# Patient Record
Sex: Female | Born: 1995 | Hispanic: No | Marital: Single | Smoking: Never smoker
Health system: Southern US, Community
[De-identification: ages and names within clinical notes are randomized; demographics above are authoritative.]

## PROBLEM LIST (undated history)

## (undated) VITALS — BP 135/76 | HR 83 | Temp 98.0°F | Resp 16

## (undated) DIAGNOSIS — F419 Anxiety disorder, unspecified: Secondary | ICD-10-CM

## (undated) DIAGNOSIS — E785 Hyperlipidemia, unspecified: Secondary | ICD-10-CM

## (undated) DIAGNOSIS — G43909 Migraine, unspecified, not intractable, without status migrainosus: Secondary | ICD-10-CM

## (undated) DIAGNOSIS — K219 Gastro-esophageal reflux disease without esophagitis: Secondary | ICD-10-CM

## (undated) DIAGNOSIS — E669 Obesity, unspecified: Secondary | ICD-10-CM

## (undated) DIAGNOSIS — K257 Chronic gastric ulcer without hemorrhage or perforation: Secondary | ICD-10-CM

## (undated) DIAGNOSIS — M549 Dorsalgia, unspecified: Secondary | ICD-10-CM

## (undated) DIAGNOSIS — F319 Bipolar disorder, unspecified: Secondary | ICD-10-CM

## (undated) DIAGNOSIS — R569 Unspecified convulsions: Secondary | ICD-10-CM

## (undated) DIAGNOSIS — F32A Depression, unspecified: Secondary | ICD-10-CM

## (undated) DIAGNOSIS — F4310 Post-traumatic stress disorder, unspecified: Secondary | ICD-10-CM

## (undated) DIAGNOSIS — R51 Headache: Secondary | ICD-10-CM

## (undated) DIAGNOSIS — E039 Hypothyroidism, unspecified: Secondary | ICD-10-CM

## (undated) DIAGNOSIS — F609 Personality disorder, unspecified: Secondary | ICD-10-CM

## (undated) DIAGNOSIS — F329 Major depressive disorder, single episode, unspecified: Secondary | ICD-10-CM

## (undated) DIAGNOSIS — I10 Essential (primary) hypertension: Secondary | ICD-10-CM

## (undated) DIAGNOSIS — G8929 Other chronic pain: Secondary | ICD-10-CM

## (undated) DIAGNOSIS — F909 Attention-deficit hyperactivity disorder, unspecified type: Secondary | ICD-10-CM

## (undated) DIAGNOSIS — R519 Headache, unspecified: Secondary | ICD-10-CM

## (undated) DIAGNOSIS — F840 Autistic disorder: Secondary | ICD-10-CM

## (undated) HISTORY — PX: OTHER SURGICAL HISTORY: SHX169

## (undated) HISTORY — DX: Hyperlipidemia, unspecified: E78.5

## (undated) HISTORY — DX: Essential (primary) hypertension: I10

## (undated) HISTORY — DX: Anxiety disorder, unspecified: F41.9

## (undated) HISTORY — DX: Hypothyroidism, unspecified: E03.9

## (undated) HISTORY — DX: Obesity, unspecified: E66.9

---

## 2015-06-28 ENCOUNTER — Ambulatory Visit (INDEPENDENT_AMBULATORY_CARE_PROVIDER_SITE_OTHER): Payer: Medicaid Other | Admitting: Family Medicine

## 2015-06-28 ENCOUNTER — Encounter: Payer: Self-pay | Admitting: Family Medicine

## 2015-06-28 VITALS — BP 112/71 | Ht 64.0 in | Wt 231.0 lb

## 2015-06-28 DIAGNOSIS — M2141 Flat foot [pes planus] (acquired), right foot: Secondary | ICD-10-CM | POA: Diagnosis not present

## 2015-06-28 DIAGNOSIS — M2142 Flat foot [pes planus] (acquired), left foot: Secondary | ICD-10-CM

## 2015-06-28 DIAGNOSIS — G5750 Tarsal tunnel syndrome, unspecified lower limb: Secondary | ICD-10-CM | POA: Diagnosis not present

## 2015-06-28 DIAGNOSIS — M25579 Pain in unspecified ankle and joints of unspecified foot: Secondary | ICD-10-CM

## 2015-07-02 ENCOUNTER — Encounter: Payer: Self-pay | Admitting: Family Medicine

## 2015-07-02 NOTE — Progress Notes (Signed)
  Tamara [name] Rhea [name] - 20 y.o. female MRN [id number]  Date of birth: 26-Jan-1996  CC: B/l ankle discomfort.   SUBJECTIVE:   HPI Tamara [name] a very pleasant 20 yo overweight [employer] freshmen presents for Games developerorthotic construction. She has had foot pain for many years. In the last week she has developed significant bilateral ankle pain. She usually wears sandals and flat. She does have tennis shoes that she wears regularly. She's had no traumas. Over the last week her ankles have been very sore both medially, anteriorly, and laterally. There [name] a moderate amount of swelling most prominently laterally. She walks for exercise. She has not been taking any medications regularly for her pain. She denies any fevers chills or night sweats.  ROS:     As above, no fevers, chills, or night sweats.  No joint swelling or new rashes.    HISTORY: Past Medical, Surgical, Social, and Family History Reviewed & Updated per EMR.  Pertinent Historical Findings include: obesity, PTSD, allergies, never smoker   OBJECTIVE: BP 112/71 mmHg  Ht 5\' 4"  (1.626 m)  Wt 231 lb (104.781 kg)  BMI 39.63 kg/m2  Physical Exam  Calm, NAD Non-labored breathing.   Ankle: b/l Sinus tarsi swelling.  Range of motion [name] full in all directions. Strength [name] 5/5 in all directions. Stable lateral and medial ligaments; squeeze test and kleiger test unremarkable; Talar dome nontender; No specific point tenderness. Generalized tenderness throughout ankle especially over posterior tibialis tendon and sinus tarsi Negative tarsal tunnel tinel's Able to walk 4 steps. Able to do b/l heel raise  Pes planus, calcaneus valgus  Patient was fitted for a : standard, cushioned, semi-rigid orthotic. The orthotic was heated and afterward the patient stood on the orthotic blank positioned on the orthotic stand. The patient was positioned in subtalar neutral position and 10 degrees of ankle dorsiflexion in a weight bearing stance. After completion of  molding, a stable base was applied to the orthotic blank. The blank was ground to a stable position for weight bearing. Size: 10 Base: Blue EVA  Posting: none Additional orthotic padding: medial heel wedge    MEDICATIONS, LABS & OTHER ORDERS: Previous Medications   FLUTICASONE (FLONASE) 50 MCG/ACT NASAL SPRAY    INSTILL 2 SPRAYS IN EACH NOSTRIL ONCE A DAY   MICROGESTIN FE 1.5/30 1.5-30 MG-MCG TABLET    TK 1 T PO QD UTD   Modified Medications   No medications on file   New Prescriptions   No medications on file   Discontinued Medications   No medications on file  No orders of the defined types were placed in this encounter.   ASSESSMENT & PLAN: Sinus Tarsi, pes planus, Post Tib dysfxn: Orthotics as above. She stated that these didn't provide  Improved support. Avoid flip flops and other shoes providing poor support. [name] need to reduce walking for a few days. In general she should probably do as much nonweightbearing exercises possible. She knows she needs to lose weight for her overall health as well as to avoid further bone and joint issues. We will see her back on an as needed. Call with any questions or concerns.  45 minutes of face-to-face time was with this patient more than half of which was face-to-face discussing the construction, sizing, and adjustment of the orthotics.

## 2016-12-18 ENCOUNTER — Emergency Department (HOSPITAL_COMMUNITY)
Admission: EM | Admit: 2016-12-18 | Discharge: 2016-12-19 | Disposition: A | Discharge status: Other Acute Inpatient Hospital | Payer: Medicaid Other | Attending: Psychiatry | Admitting: Psychiatry

## 2016-12-18 ENCOUNTER — Encounter (HOSPITAL_COMMUNITY): Payer: Self-pay

## 2016-12-18 DIAGNOSIS — Z79899 Other long term (current) drug therapy: Secondary | ICD-10-CM | POA: Insufficient documentation

## 2016-12-18 DIAGNOSIS — F909 Attention-deficit hyperactivity disorder, unspecified type: Secondary | ICD-10-CM | POA: Insufficient documentation

## 2016-12-18 DIAGNOSIS — F309 Manic episode, unspecified: Secondary | ICD-10-CM | POA: Insufficient documentation

## 2016-12-18 DIAGNOSIS — R45851 Suicidal ideations: Secondary | ICD-10-CM | POA: Insufficient documentation

## 2016-12-18 DIAGNOSIS — F259 Schizoaffective disorder, unspecified: Secondary | ICD-10-CM | POA: Diagnosis present

## 2016-12-18 HISTORY — DX: Attention-deficit hyperactivity disorder, unspecified type: F90.9

## 2016-12-18 HISTORY — DX: Personality disorder, unspecified: F60.9

## 2016-12-18 HISTORY — DX: Autistic disorder: F84.0

## 2016-12-18 HISTORY — DX: Post-traumatic stress disorder, unspecified: F43.10

## 2016-12-18 LAB — RAPID URINE DRUG SCREEN, HOSP PERFORMED
Amphetamines: NOT DETECTED
BARBITURATES: NOT DETECTED
Benzodiazepines: NOT DETECTED
COCAINE: NOT DETECTED
OPIATES: NOT DETECTED
TETRAHYDROCANNABINOL: NOT DETECTED

## 2016-12-18 LAB — COMPREHENSIVE METABOLIC PANEL
ALK PHOS: 134 U/L — AB (ref 38–126)
ALT: 22 U/L (ref 14–54)
ANION GAP: 11 (ref 5–15)
AST: 23 U/L (ref 15–41)
Albumin: 4.1 g/dL (ref 3.5–5.0)
BILIRUBIN TOTAL: 0.3 mg/dL (ref 0.3–1.2)
BUN: 9 mg/dL (ref 6–20)
CALCIUM: 9.5 mg/dL (ref 8.9–10.3)
CO2: 21 mmol/L — AB (ref 22–32)
CREATININE: 0.66 mg/dL (ref 0.44–1.00)
Chloride: 107 mmol/L (ref 101–111)
Glucose, Bld: 95 mg/dL (ref 65–99)
Potassium: 3.9 mmol/L (ref 3.5–5.1)
Sodium: 139 mmol/L (ref 135–145)
TOTAL PROTEIN: 8 g/dL (ref 6.5–8.1)

## 2016-12-18 LAB — CBC
HEMATOCRIT: 43 % (ref 36.0–46.0)
HEMOGLOBIN: 14.3 g/dL (ref 12.0–15.0)
MCH: 30.2 pg (ref 26.0–34.0)
MCHC: 33.3 g/dL (ref 30.0–36.0)
MCV: 90.7 fL (ref 78.0–100.0)
Platelets: 371 10*3/uL (ref 150–400)
RBC: 4.74 MIL/uL (ref 3.87–5.11)
RDW: 12.5 % (ref 11.5–15.5)
WBC: 15.3 10*3/uL — ABNORMAL HIGH (ref 4.0–10.5)

## 2016-12-18 LAB — SALICYLATE LEVEL

## 2016-12-18 LAB — ETHANOL: Alcohol, Ethyl (B): <10 mg/dL (ref <10)

## 2016-12-18 LAB — ACETAMINOPHEN LEVEL

## 2016-12-18 MED ORDER — ONDANSETRON HCL 4 MG PO TABS: 4.0000 mg | ORAL_TABLET | Freq: Three times a day (TID) | ORAL | Status: DC | PRN

## 2016-12-18 MED ORDER — ALUM & MAG HYDROXIDE-SIMETH 200-200-20 MG/5ML PO SUSP: 30.0000 mL | Freq: Four times a day (QID) | ORAL | Status: DC | PRN

## 2016-12-18 MED ORDER — ACETAMINOPHEN 325 MG PO TABS: 650.0000 mg | ORAL_TABLET | ORAL | Status: DC | PRN

## 2016-12-18 NOTE — ED Triage Notes (Signed)
Patient states she saw her therapist today and was having long periods of sleeping and feeling like she ws intoxicated. Patient states she could not tell what day it was or the time because she has been sleeping for long periods. Patient states she does not remember the pills she was on. Patient states  She has had suicidal thoughts that are on and off like a light switch. Patient states the psychiatrist increased the dosages of medications and states she does not feel like she needs to sleep for long periods of time. Patient states she knows she is taking Risperdal, but can not remember the other meds. Patient states she has been seeing creepy dreams about a weird nurse and seeing shadowy figures. Patient states the weird nurse states that they want to stick her with a weird medication or be killed by "Mr. Knifey."

## 2016-12-18 NOTE — ED Notes (Signed)
Bed: [facility] Expected date:  Expected time:  Means of arrival:  Comments: 
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
## 2016-12-18 NOTE — ED Notes (Signed)
Bed: WA29 Expected date:  Expected time:  Means of arrival:  Comments: Triage 4  

## 2016-12-19 ENCOUNTER — Inpatient Hospital Stay (HOSPITAL_COMMUNITY)
Admission: AD | Admit: 2016-12-19 | Discharge: 2016-12-23 | DRG: 885 | Disposition: A | Discharge status: Home / Self Care | Payer: Medicaid Other | Source: Intra-hospital | Attending: Psychiatry | Admitting: Psychiatry

## 2016-12-19 ENCOUNTER — Encounter (HOSPITAL_COMMUNITY): Payer: Self-pay | Admitting: *Deleted

## 2016-12-19 DIAGNOSIS — K219 Gastro-esophageal reflux disease without esophagitis: Secondary | ICD-10-CM | POA: Diagnosis present

## 2016-12-19 DIAGNOSIS — F419 Anxiety disorder, unspecified: Secondary | ICD-10-CM | POA: Diagnosis not present

## 2016-12-19 DIAGNOSIS — F4310 Post-traumatic stress disorder, unspecified: Secondary | ICD-10-CM | POA: Diagnosis present

## 2016-12-19 DIAGNOSIS — Z888 Allergy status to other drugs, medicaments and biological substances status: Secondary | ICD-10-CM

## 2016-12-19 DIAGNOSIS — R443 Hallucinations, unspecified: Secondary | ICD-10-CM | POA: Diagnosis not present

## 2016-12-19 DIAGNOSIS — F29 Unspecified psychosis not due to a substance or known physiological condition: Secondary | ICD-10-CM | POA: Diagnosis not present

## 2016-12-19 DIAGNOSIS — F259 Schizoaffective disorder, unspecified: Principal | ICD-10-CM | POA: Diagnosis present

## 2016-12-19 DIAGNOSIS — R413 Other amnesia: Secondary | ICD-10-CM | POA: Diagnosis not present

## 2016-12-19 DIAGNOSIS — F39 Unspecified mood [affective] disorder: Secondary | ICD-10-CM | POA: Diagnosis not present

## 2016-12-19 DIAGNOSIS — R45850 Homicidal ideations: Secondary | ICD-10-CM | POA: Diagnosis present

## 2016-12-19 DIAGNOSIS — F840 Autistic disorder: Secondary | ICD-10-CM | POA: Diagnosis present

## 2016-12-19 DIAGNOSIS — Z915 Personal history of self-harm: Secondary | ICD-10-CM

## 2016-12-19 DIAGNOSIS — R45851 Suicidal ideations: Secondary | ICD-10-CM | POA: Diagnosis present

## 2016-12-19 DIAGNOSIS — F909 Attention-deficit hyperactivity disorder, unspecified type: Secondary | ICD-10-CM | POA: Diagnosis present

## 2016-12-19 DIAGNOSIS — Z886 Allergy status to analgesic agent status: Secondary | ICD-10-CM | POA: Diagnosis not present

## 2016-12-19 DIAGNOSIS — F410 Panic disorder [episodic paroxysmal anxiety] without agoraphobia: Secondary | ICD-10-CM | POA: Diagnosis present

## 2016-12-19 DIAGNOSIS — F329 Major depressive disorder, single episode, unspecified: Secondary | ICD-10-CM | POA: Diagnosis present

## 2016-12-19 DIAGNOSIS — F603 Borderline personality disorder: Secondary | ICD-10-CM | POA: Diagnosis present

## 2016-12-19 DIAGNOSIS — F4010 Social phobia, unspecified: Secondary | ICD-10-CM | POA: Diagnosis not present

## 2016-12-19 DIAGNOSIS — R4587 Impulsiveness: Secondary | ICD-10-CM | POA: Diagnosis not present

## 2016-12-19 DIAGNOSIS — R4584 Anhedonia: Secondary | ICD-10-CM | POA: Diagnosis not present

## 2016-12-19 DIAGNOSIS — G4700 Insomnia, unspecified: Secondary | ICD-10-CM | POA: Diagnosis present

## 2016-12-19 MED ORDER — HYDROXYZINE HCL 25 MG PO TABS
25.0000 mg | ORAL_TABLET | Freq: Four times a day (QID) | ORAL | Status: DC | PRN
  Administered 2016-12-21 – 2016-12-23 (×4): 25 mg via ORAL
  Filled 2016-12-19 (×2): qty 1
  Filled 2016-12-19: qty 4
  Filled 2016-12-19 (×2): qty 1

## 2016-12-19 MED ORDER — MELATONIN 3 MG PO TABS: 6.0000 mg | ORAL_TABLET | Freq: Every day | ORAL | Status: DC

## 2016-12-19 MED ORDER — RISPERIDONE 3 MG PO TABS
3.0000 mg | ORAL_TABLET | Freq: Every day | ORAL | Status: DC
  Administered 2016-12-19 – 2016-12-20 (×2): 3 mg via ORAL
  Filled 2016-12-19 (×5): qty 1

## 2016-12-19 MED ORDER — VENLAFAXINE HCL ER 75 MG PO CP24
150.0000 mg | ORAL_CAPSULE | Freq: Every day | ORAL | Status: DC
  Administered 2016-12-19: 150 mg via ORAL
  Filled 2016-12-19: qty 2

## 2016-12-19 MED ORDER — BENZTROPINE MESYLATE 1 MG PO TABS
1.0000 mg | ORAL_TABLET | Freq: Every day | ORAL | Status: DC
  Administered 2016-12-19 – 2016-12-22 (×4): 1 mg via ORAL
  Filled 2016-12-19: qty 1
  Filled 2016-12-19: qty 2
  Filled 2016-12-19 (×6): qty 1

## 2016-12-19 MED ORDER — CARBAMAZEPINE ER 100 MG PO TB12
100.0000 mg | ORAL_TABLET | Freq: Two times a day (BID) | ORAL | Status: DC
  Administered 2016-12-19 – 2016-12-21 (×4): 100 mg via ORAL
  Filled 2016-12-19 (×9): qty 1

## 2016-12-19 MED ORDER — PANTOPRAZOLE SODIUM 40 MG PO TBEC
40.0000 mg | DELAYED_RELEASE_TABLET | Freq: Every day | ORAL | Status: DC
  Administered 2016-12-20 – 2016-12-23 (×4): 40 mg via ORAL
  Filled 2016-12-19: qty 2
  Filled 2016-12-19 (×6): qty 1

## 2016-12-19 MED ORDER — ALUM & MAG HYDROXIDE-SIMETH 200-200-20 MG/5ML PO SUSP
30.0000 mL | ORAL | Status: DC | PRN
  Administered 2016-12-22: 30 mL via ORAL
  Filled 2016-12-19: qty 30

## 2016-12-19 MED ORDER — ONDANSETRON HCL 4 MG PO TABS: 4.0000 mg | ORAL_TABLET | Freq: Three times a day (TID) | ORAL | Status: DC | PRN

## 2016-12-19 MED ORDER — LISDEXAMFETAMINE DIMESYLATE 30 MG PO CAPS
60.0000 mg | ORAL_CAPSULE | Freq: Every day | ORAL | Status: DC
  Administered 2016-12-20: 60 mg via ORAL
  Filled 2016-12-19: qty 2

## 2016-12-19 MED ORDER — AMPHETAMINE-DEXTROAMPHETAMINE 10 MG PO TABS
20.0000 mg | ORAL_TABLET | Freq: Two times a day (BID) | ORAL | Status: DC
  Administered 2016-12-20: 20 mg via ORAL
  Filled 2016-12-19: qty 2

## 2016-12-19 MED ORDER — ACETAMINOPHEN 325 MG PO TABS
650.0000 mg | ORAL_TABLET | Freq: Four times a day (QID) | ORAL | Status: DC | PRN
  Administered 2016-12-19 – 2016-12-23 (×3): 650 mg via ORAL
  Filled 2016-12-19 (×3): qty 2

## 2016-12-19 MED ORDER — TRAZODONE HCL 50 MG PO TABS
50.0000 mg | ORAL_TABLET | Freq: Every evening | ORAL | Status: DC | PRN
  Administered 2016-12-20 – 2016-12-22 (×3): 50 mg via ORAL
  Filled 2016-12-19 (×2): qty 1
  Filled 2016-12-19: qty 2
  Filled 2016-12-19: qty 1

## 2016-12-19 MED ORDER — LISDEXAMFETAMINE DIMESYLATE 30 MG PO CAPS
60.0000 mg | ORAL_CAPSULE | Freq: Every day | ORAL | Status: DC
  Administered 2016-12-19: 60 mg via ORAL
  Filled 2016-12-19: qty 2

## 2016-12-19 MED ORDER — BENZTROPINE MESYLATE 1 MG PO TABS
1.0000 mg | ORAL_TABLET | Freq: Every day | ORAL | Status: DC
  Administered 2016-12-19: 1 mg via ORAL
  Filled 2016-12-19: qty 1

## 2016-12-19 MED ORDER — PANTOPRAZOLE SODIUM 40 MG PO TBEC
40.0000 mg | DELAYED_RELEASE_TABLET | Freq: Every day | ORAL | Status: DC
  Administered 2016-12-19: 40 mg via ORAL
  Filled 2016-12-19: qty 1

## 2016-12-19 MED ORDER — VENLAFAXINE HCL ER 150 MG PO CP24
150.0000 mg | ORAL_CAPSULE | Freq: Every day | ORAL | Status: DC
  Administered 2016-12-20 – 2016-12-23 (×4): 150 mg via ORAL
  Filled 2016-12-19 (×6): qty 1
  Filled 2016-12-19: qty 2
  Filled 2016-12-19: qty 1

## 2016-12-19 MED ORDER — MAGNESIUM HYDROXIDE 400 MG/5ML PO SUSP: 30.0000 mL | Freq: Every day | ORAL | Status: DC | PRN

## 2016-12-19 MED ORDER — RISPERIDONE 1 MG PO TABS
3.0000 mg | ORAL_TABLET | Freq: Every day | ORAL | Status: DC
Start: 2016-12-19 — End: 2016-12-19
  Administered 2016-12-19: 3 mg via ORAL
  Filled 2016-12-19: qty 1

## 2016-12-19 MED ORDER — CARBAMAZEPINE ER 100 MG PO TB12
100.0000 mg | ORAL_TABLET | Freq: Two times a day (BID) | ORAL | Status: DC
  Administered 2016-12-19 (×2): 100 mg via ORAL
  Filled 2016-12-19 (×2): qty 1

## 2016-12-19 MED ORDER — AMPHETAMINE-DEXTROAMPHETAMINE 20 MG PO TABS
20.0000 mg | ORAL_TABLET | Freq: Two times a day (BID) | ORAL | Status: DC
  Administered 2016-12-19: 20 mg via ORAL
  Filled 2016-12-19: qty 1

## 2016-12-19 NOTE — BH Assessment (Signed)
[facility] Assessment Progress Note  12/19/16: Patient has been accepted to [facility] to [id number] at 14:30. Paperwork completed.

## 2016-12-19 NOTE — Progress Notes (Signed)
Patient is a 21 year old female, admitted voluntarily from [facility].  Patient reports she was admitted to [facility] two weeks ago from an intentional OD of melatonin and the medications she was discharged on, "Made me sleep 20 hours every day." Patient visited her OP therapist who gave her the option of either going the ED of she would IVC her. Patient continues to report passive SI, with out a plan. Patient states, "I was having thoughts like, 'If I was dead everyone would be happy.'"She is able to verbally contract for safety on the unit. She also repots, "Shadow figures in the edge of my vision. Some times on the walls and sometimes on the floor." Denies HI and AH.  Patient does have a history of seizures with the last one, "A couple of weeks ago." Patient also has a history of self harm, via burning and cutting. Patient reports her triggers are her therapist and her mentor are not supportive, have too many expectations and she is overwhelmed by her responsibilities.  Paperwork completed. Personal belongings secured in locker. Oriented to unit. Q 15 minute checks initiated

## 2016-12-19 NOTE — ED Notes (Signed)
Patient transferred to [facility] via [place] transport
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
## 2016-12-19 NOTE — ED Notes (Addendum)
Report called to Waynetta Sandy, RN at [facility]. [facility] transport notified for transportation to [facility]
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
## 2016-12-19 NOTE — Tx Team (Signed)
Initial Treatment Plan 12/19/2016 7:03 PM Tamara [name] WUJ:[id number]    PATIENT STRESSORS: Medication change or noncompliance Occupational concerns   PATIENT STRENGTHS: Ability for insight Communication skills General fund of knowledge   PATIENT IDENTIFIED PROBLEMS: Risk for suicide  Depression  Anxiety  "Be more open and honest."  "Have meds that help, not hurt."             DISCHARGE CRITERIA:  Ability to meet basic life and health needs Improved stabilization in mood, thinking, and/or behavior Motivation to continue treatment in a less acute level of care  PRELIMINARY DISCHARGE PLAN: Outpatient therapy Return to previous living arrangement  PATIENT/FAMILY INVOLVEMENT: This treatment plan has been presented to and reviewed with the patient, Tamara [name].  The patient has been given the opportunity to ask questions and make suggestions.  Almira Bar, RN 12/19/2016, 7:03 PM

## 2016-12-20 ENCOUNTER — Encounter (HOSPITAL_COMMUNITY): Payer: Self-pay | Admitting: Student

## 2016-12-20 DIAGNOSIS — F4310 Post-traumatic stress disorder, unspecified: Secondary | ICD-10-CM

## 2016-12-20 DIAGNOSIS — R443 Hallucinations, unspecified: Secondary | ICD-10-CM

## 2016-12-20 DIAGNOSIS — R45851 Suicidal ideations: Secondary | ICD-10-CM

## 2016-12-20 DIAGNOSIS — F259 Schizoaffective disorder, unspecified: Principal | ICD-10-CM

## 2016-12-20 DIAGNOSIS — F909 Attention-deficit hyperactivity disorder, unspecified type: Secondary | ICD-10-CM

## 2016-12-20 DIAGNOSIS — F410 Panic disorder [episodic paroxysmal anxiety] without agoraphobia: Secondary | ICD-10-CM

## 2016-12-20 DIAGNOSIS — R441 Visual hallucinations: Secondary | ICD-10-CM

## 2016-12-20 DIAGNOSIS — F4010 Social phobia, unspecified: Secondary | ICD-10-CM

## 2016-12-20 DIAGNOSIS — R4587 Impulsiveness: Secondary | ICD-10-CM

## 2016-12-20 DIAGNOSIS — F840 Autistic disorder: Secondary | ICD-10-CM

## 2016-12-20 DIAGNOSIS — F603 Borderline personality disorder: Secondary | ICD-10-CM | POA: Diagnosis present

## 2016-12-20 DIAGNOSIS — R450 Nervousness: Secondary | ICD-10-CM

## 2016-12-20 DIAGNOSIS — R440 Auditory hallucinations: Secondary | ICD-10-CM

## 2016-12-20 DIAGNOSIS — Z62810 Personal history of physical and sexual abuse in childhood: Secondary | ICD-10-CM

## 2016-12-20 DIAGNOSIS — F419 Anxiety disorder, unspecified: Secondary | ICD-10-CM

## 2016-12-20 DIAGNOSIS — R4584 Anhedonia: Secondary | ICD-10-CM

## 2016-12-20 DIAGNOSIS — R413 Other amnesia: Secondary | ICD-10-CM

## 2016-12-20 NOTE — Progress Notes (Signed)
Adult Psychoeducational Group Note  Date:  12/20/2016 Time:  2:34 AM  Group Topic/Focus:  Wrap-Up Group:   The focus of this group is to help patients review their daily goal of treatment and discuss progress on daily workbooks.  Participation Level:  Active  Participation Quality:  Appropriate  Affect:  Appropriate  Cognitive:  Appropriate  Insight: Appropriate  Engagement in Group:  Engaged  Modes of Intervention:  Discussion  Additional Comments:  Pt is new admission. Pt stated her goal for today was to talk with her doctor about her medication. Pt stated she did not get to accomplished her goal today. Pt rated her day a 4 out of 10.  Felipa Furnace 12/20/2016, 2:34 AM
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
## 2016-12-20 NOTE — [facility] Suicide Risk Assessment (Signed)
[facility] INPATIENT:  Family/Significant Other Suicide Prevention Education  Suicide Prevention Education:  Patient Refusal for Family/Significant Other Suicide Prevention Education: The patient Tamara [name] has refused to provide written consent for family/significant other to be provided Family/Significant Other Suicide Prevention Education during admission and/or prior to discharge.  Physician notified.  Writer provided suicide prevention education directly to patient; conversation included risk factors, warning signs and resources to contact for help. Mobile crisis services explained and  explanations given as to resources which will be included on patient's discharge paperwork.  Carney Bern 12/20/2016, 4:42 PM

## 2016-12-20 NOTE — Progress Notes (Signed)
Tamara [name] had been up and visible in milieu this evening, did attend and participate in evening activity. She was seen interacting appropriately with peers in milieu. She did endorse some anxiety and spoke about how she has been sleeping a lot lately and not getting up until 11am most of the time. She did endorse some back pain and did request and receive tylenol and was able to receive all bedtime medications without incident. A. Support and encouragement provided. R. Safety maintained, will continue to monitor.

## 2016-12-20 NOTE — [facility] Suicide Risk Assessment (Signed)
[facility] Admission Suicide Risk Assessment   Nursing information obtained from:    Demographic factors:    Current Mental Status:    Loss Factors:    Historical Factors:    Risk Reduction Factors:     Total Time spent with patient: 30 minutes Principal Problem: Schizoaffective disorder (HCC) Diagnosis:   Patient Active Problem List   Diagnosis Date Noted  . Borderline personality disorder (HCC) [F60.3] 12/20/2016  . Suicidal ideations [R45.851] 12/20/2016  . Schizoaffective disorder (HCC) [F25.9] 12/19/2016   Subjective Data:  21 y.o female, single, in college. Took the semester off. Background history of Borderline PD and Schizoaffective disorder. Self presented to the ER unaccompanied. Says she was referred by her therapist . Reports chronic feelings as if things are not real. Reports chronic suicidal thoughts. Has not been sleeping well lately. Sees shadows and figures. Recently discharged from [facility] inpatient care. On multiple medications including stimulants. Routine labs shows insignificant mild leucocytosis. UDS is negative. BAL<5 mg/dl. Reports chaotic relationship with her mom. Reports chaotic relationship with one of her roommates. Says she feels as if she is "drunk" on her medications. She denies any suicidal thoughts. No violent thoughts. No homicidal thoughts. She does not have access to weapons. No substance use. Very focused on taking stimulants. Medication adjustments discussed. Would hold off stimulants and taper daytime Risperidone. Patient consented to treatment.    Continued Clinical Symptoms:  Alcohol Use Disorder Identification Test Final Score (AUDIT): 0 The "Alcohol Use Disorders Identification Test", Guidelines for Use in Primary Care, Second Edition.  World Science writer [facility]). Score between 0-7:  no or low risk or alcohol related problems. Score between 8-15:  moderate risk of alcohol related problems. Score between 16-19:  high risk of alcohol related  problems. Score 20 or above:  warrants further diagnostic evaluation for alcohol dependence and treatment.   CLINICAL FACTORS:   Personality Disorders:   Cluster B   Musculoskeletal: Strength & Muscle Tone: within normal limits Gait & Station: normal Patient leans: N/A  Psychiatric Specialty Exam: Physical Exam  Constitutional: She is oriented to person, place, and time. She appears well-developed and well-nourished.  HENT:  Head: Normocephalic and atraumatic.  Respiratory: Effort normal.  Neurological: She is alert and oriented to person, place, and time.  Psychiatric:  As above     ROS  Blood pressure 116/76, pulse 88, temperature 98.4 F (36.9 C), temperature source Oral, resp. rate 17, height 5' 2.5" (1.588 m), weight 104.3 kg (230 lb), SpO2 100 %.Body mass index is 41.4 kg/m.  General Appearance: Overweight, in hospital clothing. Slightly anxious. Impressionistic pattern of relating.  Eye Contact:  Good  Speech:  Clear and Coherent and Normal Rate  Volume:  Normal  Mood:  Worried she might be in trouble for coming here  Affect:  Appropriate and Restricted  Thought Process:  Linear  Orientation:  Full (Time, Place, and Person)  Thought Content:  Ilusions  Suicidal Thoughts:  No  Homicidal Thoughts:  No  Memory:  Immediate;   Good Recent;   Good Remote;   Good  Judgement:  Fair  Insight:  Fair  Psychomotor Activity:  Normal  Concentration:  Concentration: Good and Attention Span: Good  Recall:  Good  Fund of Knowledge:  Good  Language:  Good  Akathisia:  Negative  Handed:    AIMS (if indicated):     Assets:  Communication Skills Desire for Improvement Housing Physical Health Resilience  ADL's:  Fair  Cognition:  WNL  Sleep:  Number of Hours: 6.75      COGNITIVE FEATURES THAT CONTRIBUTE TO RISK:  None    SUICIDE RISK:   Minimal: No identifiable suicidal ideation.  Patients presenting with no risk factors but with morbid ruminations; may be  classified as minimal risk based on the severity of the depressive symptoms  PLAN OF CARE:  1. Hold stimulants 2. Make Risperidone at bedtime only 3. Monitor mood, behavior and interaction with peers 4. Team would follow up tomorrow.   I certify that inpatient services furnished can reasonably be expected to improve the patient's condition.   Georgiann Cocker, MD 12/20/2016, 5:01 PM

## 2016-12-20 NOTE — [facility] Group Notes (Signed)
[facility] Group Notes:  (Nursing) Date:  12/20/2016  Time:  9:42 AM  Type of Therapy:  Nurse Education  Participation Level:  Active  Participation Quality:  Appropriate  Affect:  Appropriate  Cognitive:  Alert, Appropriate and Oriented  Insight:  Appropriate and Good  Engagement in Group:  Engaged  Modes of Intervention:  Discussion, Education and Rapport Building  Summary of Progress/Problems:  Shela Nevin 12/20/2016, 9:42 AM
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
## 2016-12-20 NOTE — Progress Notes (Signed)
D. Pt has been calm and cooperative throughout shift with no behavioral issues noted.Pt somewhat talkative and childlike in behavior. Pt currently denies SI/HI and AVH and agrees to contact staff before acting on any harmful thoughts.  A. Labs and vitals monitored. Pt compliant with meds Pt supported emotionally and encouraged to express concerns and ask questions.   R. Pt remains safe with 15 minute checks. Will continue POC.

## 2016-12-20 NOTE — [facility] Group Notes (Signed)
[facility] LCSW Group Therapy Note  Date/Time:    12/20/2016   10:00 - 11:00 AM  Type of Therapy and Topic:  Group Therapy:  Healthy Self Image and Positive Change  Participation Level:  Active   Description of Group:  In this group, patients compared and contrasted their current "I am...." statements to the visions they identified as desirable for their lives.  Patients discussed their tendency toward cognitive distortions, and how they can go about making positive changes in their cognitions that will positively impact their behaviors.  Many expressions of similarities and mutual support were provided among group members.  Facilitator played a motivational 3-minute speech and a discussion was held regarding reactions.  Patients were left with the task of thinking about what "I am...." statements they can start using in their lives immediately.  Therapeutic Goals: 1. Patient will state their current self-perception as expressed in an "I Am" statement 2. Patient will contrast this with their desired vision for their lives 3. Patient will discuss cognitive distortions and how these affect their ongoing "I Am" thoughts 4. Patient will verbalize statements that challenge their cognitive distortions  Summary of Patient Progress:  The patient expressed initially "I am psychopathic", then by the end of group was able to state "I am friendly" and explain why she was able to make this change.  She was intrusive and monopolizing throughout group, and had to be redirected from side conversations.   Therapeutic Modalities Cognitive Behavioral Therapy Motivational Interviewing  Ambrose Mantle, LCSW 12/20/2016 8:25 AM

## 2016-12-21 DIAGNOSIS — F29 Unspecified psychosis not due to a substance or known physiological condition: Secondary | ICD-10-CM

## 2016-12-21 DIAGNOSIS — F39 Unspecified mood [affective] disorder: Secondary | ICD-10-CM

## 2016-12-21 MED ORDER — RISPERIDONE 1 MG PO TABS
1.0000 mg | ORAL_TABLET | Freq: Every day | ORAL | Status: DC
  Administered 2016-12-21 – 2016-12-22 (×2): 1 mg via ORAL
  Filled 2016-12-21 (×2): qty 1
  Filled 2016-12-21: qty 2
  Filled 2016-12-21 (×2): qty 1

## 2016-12-21 NOTE — Plan of Care (Signed)
Problem: Coping: Goal: Ability to verbalize frustrations and anger appropriately will improve Outcome: Progressing Pt has been appropriate on the unit this evening , pt expressed the issues with her family and being able to control her money.   Problem: Safety: Goal: Periods of time without injury will increase Outcome: Progressing Pt safe on the unit  At this time

## 2016-12-21 NOTE — Tx Team (Signed)
Interdisciplinary Treatment and Diagnostic Plan Update  12/21/2016 Time of Session: 9:30am Tamara [name] MRN: [id number]  Principal Diagnosis: Schizoaffective disorder [facility])  Secondary Diagnoses: Principal Problem:   Schizoaffective disorder (HCC) Active Problems:   Borderline personality disorder (HCC)   Suicidal ideations   Current Medications:  Current Facility-Administered Medications  Medication Dose Route Frequency Provider Last Rate Last Dose  . acetaminophen (TYLENOL) tablet 650 mg  650 mg Oral Q6H PRN Laveda Abbe, NP   650 mg at 12/19/16 2233  . alum & mag hydroxide-simeth (MAALOX/MYLANTA) 200-200-20 MG/5ML suspension 30 mL  30 mL Oral Q4H PRN Laveda Abbe, NP      . benztropine (COGENTIN) tablet 1 mg  1 mg Oral QHS Laveda Abbe, NP   1 mg at 12/20/16 2128  . carbamazepine (TEGRETOL XR) 12 hr tablet 100 mg  100 mg Oral BID Laveda Abbe, NP   100 mg at 12/21/16 1308  . hydrOXYzine (ATARAX/VISTARIL) tablet 25 mg  25 mg Oral Q6H PRN Laveda Abbe, NP      . magnesium hydroxide (MILK OF MAGNESIA) suspension 30 mL  30 mL Oral Daily PRN Laveda Abbe, NP      . ondansetron [facility]) tablet 4 mg  4 mg Oral Q8H PRN Laveda Abbe, NP      . pantoprazole (PROTONIX) EC tablet 40 mg  40 mg Oral Daily Laveda Abbe, NP   40 mg at 12/21/16 0816  . risperiDONE (RISPERDAL) tablet 3 mg  3 mg Oral QHS Laveda Abbe, NP   3 mg at 12/20/16 2128  . traZODone (DESYREL) tablet 50 mg  50 mg Oral QHS PRN Laveda Abbe, NP   50 mg at 12/20/16 2128  . venlafaxine XR (EFFEXOR-XR) 24 hr capsule 150 mg  150 mg Oral Q breakfast Laveda Abbe, NP   150 mg at 12/21/16 6578    PTA Medications: Prescriptions Prior to Admission  Medication Sig Dispense Refill Last Dose  . amphetamine-dextroamphetamine (ADDERALL) 20 MG tablet Take 20 mg by mouth. TAKE 10 TO 20 MG DAILY IN THE AFTERNOON AND 20 MG IN THE EVENING  AS NEEDED FOR ADHD     . benztropine (COGENTIN) 1 MG tablet Take 1 mg by mouth at bedtime.     . Carbamazepine (EQUETRO) 100 MG CP12 12 hr capsule Take 100 mg by mouth 2 (two) times daily.     Marland Kitchen desvenlafaxine (PRISTIQ) 100 MG 24 hr tablet Take 100 mg by mouth daily.     Marland Kitchen desvenlafaxine (PRISTIQ) 50 MG 24 hr tablet Take 50 mg by mouth daily.     . hydrOXYzine (ATARAX/VISTARIL) 25 MG tablet Take 25 mg by mouth every 6 (six) hours as needed for anxiety.     Marland Kitchen lisdexamfetamine (VYVANSE) 60 MG capsule Take 60 mg by mouth daily.     . Melatonin 3 MG TABS Take 6 mg by mouth at bedtime.   12/17/2016 at Unknown time  . pantoprazole (PROTONIX) 40 MG tablet Take 40 mg by mouth daily.     . risperiDONE (RISPERDAL) 3 MG tablet Take 3 mg by mouth at bedtime.       Treatment Modalities: Medication Management, Group therapy, Case management,  1 to 1 session with clinician, Psychoeducation, Recreational therapy.  Patient Stressors: Medication change or noncompliance Occupational concerns  Patient Strengths: Ability for insight Wellsite geologist fund of knowledge  Physician Treatment Plan for Primary Diagnosis: Schizoaffective disorder (HCC) Long Term Goal(s): Improvement in  symptoms so as ready for discharge  Short Term Goals: Ability to maintain clinical measurements within normal limits will improve Compliance with prescribed medications will improve Ability to verbalize feelings will improve Ability to disclose and discuss suicidal ideas Ability to demonstrate self-control will improve  Medication Management: Evaluate patient's response, side effects, and tolerance of medication regimen.  Therapeutic Interventions: 1 to 1 sessions, Unit Group sessions and Medication administration.  Evaluation of Outcomes: Progressing  Physician Treatment Plan for Secondary Diagnosis: Principal Problem:   Schizoaffective disorder (HCC) Active Problems:   Borderline personality disorder (HCC)    Suicidal ideations   Long Term Goal(s): Improvement in symptoms so as ready for discharge  Short Term Goals: Ability to maintain clinical measurements within normal limits will improve Compliance with prescribed medications will improve Ability to verbalize feelings will improve Ability to disclose and discuss suicidal ideas Ability to demonstrate self-control will improve  Medication Management: Evaluate patient's response, side effects, and tolerance of medication regimen.  Therapeutic Interventions: 1 to 1 sessions, Unit Group sessions and Medication administration.  Evaluation of Outcomes: Progressing   RN Treatment Plan for Primary Diagnosis: Schizoaffective disorder (HCC) Long Term Goal(s): Knowledge of disease and therapeutic regimen to maintain health will improve  Short Term Goals: Ability to disclose and discuss suicidal ideas, Ability to identify and develop effective coping behaviors will improve and Compliance with prescribed medications will improve  Medication Management: RN will administer medications as ordered by provider, will assess and evaluate patient's response and provide education to patient for prescribed medication. RN will report any adverse and/or side effects to prescribing provider.  Therapeutic Interventions: 1 on 1 counseling sessions, Psychoeducation, Medication administration, Evaluate responses to treatment, Monitor vital signs and CBGs as ordered, Perform/monitor CIWA, COWS, AIMS and Fall Risk screenings as ordered, Perform wound care treatments as ordered.  Evaluation of Outcomes: Progressing   LCSW Treatment Plan for Primary Diagnosis: Schizoaffective disorder (HCC) Long Term Goal(s): Safe transition to appropriate next level of care at discharge, Engage patient in therapeutic group addressing interpersonal concerns.  Short Term Goals: Engage patient in aftercare planning with referrals and resources and Increase skills for wellness and  recovery  Therapeutic Interventions: Assess for all discharge needs, 1 to 1 time with Social worker, Explore available resources and support systems, Assess for adequacy in community support network, Educate family and significant other(s) on suicide prevention, Complete Psychosocial Assessment, Interpersonal group therapy.  Evaluation of Outcomes: Progressing   Progress in Treatment: Attending groups: Yes Participating in groups: Yes Taking medication as prescribed: Yes, MD continues to assess for medication changes as needed Toleration medication: Yes, no side effects reported at this time Family/Significant other contact made: No, Pt declines Patient understands diagnosis: Continuing to assess Discussing patient identified problems/goals with staff: Yes Medical problems stabilized or resolved: Yes Denies suicidal/homicidal ideation: Yes Issues/concerns per patient self-inventory: None Other: N/A  New problem(s) identified: None identified at this time.   New Short Term/Long Term Goal(s): None identified at this time.   Discharge Plan or Barriers: Pt will return home and follow-up with outpatient services at [facility]  Reason for Continuation of Hospitalization: Anxiety Depression Medication stabilization  Estimated Length of Stay: 1-3 days; est DC date 10/17  Attendees: Patient:  12/21/2016  10:43 AM  Physician: Dr. Jama Flavors, MD 12/21/2016  10:43 AM  Nursing: Rodell Perna, RN 12/21/2016  10:43 AM  RN Care Manager: Onnie Boer, RN 12/21/2016  10:43 AM  Social Worker: Vernie Shanks, LCSW 12/21/2016  10:43 AM  Recreational Therapist:  12/21/2016  10:43 AM  Other: Armandina Stammer, NP; Reola Calkins, NP; Hillery Jacks, NP 12/21/2016  10:43 AM  Other:  12/21/2016  10:43 AM  Other: 12/21/2016  10:43 AM    Scribe for Treatment Team: Verdene Lennert, LCSW 12/21/2016 10:43 AM

## 2016-12-21 NOTE — Progress Notes (Signed)
Recreation Therapy Notes  Date:  12/21/16  Time: 0930 Location: 300 Hall Dayroom  Group Topic: Stress Management  Goal Area(s) Addresses:  Patient will verbalize importance of using healthy stress management.  Patient will identify positive emotions associated with healthy stress management.   Behavioral Response: Engaged  Intervention: Stress Management  Activity :  Progressive Muscle Relaxation.  LRT introduced the stress management technique of progressive muscle relaxation.  LRT read a script to allow patients to focus on each muscle group individually to tense and then relax the muscles.  Patients were to follow along as the script was read to engage in the activity.  Education:  Stress Management, Discharge Planning.   Education Outcome: Acknowledges edcuation/In group clarification offered/Needs additional education  Clinical Observations/Feedback: Pt attended group.   Avi Kerschner, LRT/CTRS         Emilyrose Darrah A 12/21/2016 11:38 AM 

## 2016-12-21 NOTE — Progress Notes (Signed)
D: Pt observed on the hallway writing. During assessment, Pt expressed irritability and anger; "That African doctor took away my ADHD medication and in know what will happen" Pt endorsed moderate chronic back pain. Pt denied SI, HI, pain or AVH; "I long as I get my ADHD medication, I will be fine." Pt walked away from the nurse during assessment; "if you can't do anything about my ADHD medication then I don't need to be talking to you-you just don't get it."  A: Medications offered as prescribed. Pt given the opportunity to ask questions and state concerns. Support, encouragement, and safe environment provided. R: All patient's questions and concerns addressed. 15-minute safety checks continue. Safety checks continue. Pt did not attend wrap-up group.

## 2016-12-21 NOTE — Progress Notes (Addendum)
[facility] MD Progress Note  12/21/2016 3:57 PM Tamara [name]  MRN:  [id number] Subjective:  Reports feeling " tired", but reports she is feeling better than when she was admitted . At this time she denies suicidal ideations. Objective : I have discussed case with treatment team and have met with patient. 21 year old female, presented to ED voluntarily , at the encouragement of her outpatient therapist  reporting suicidal ideations, sleeping excessively, and feeling overmedicated/intoxicated on medication regimen . She had a recent psychiatric admission to [facility] . Chart notes indicate prior diagnosis of Schizoaffective Disorder, Borderline Personality Disorder. Patient also reports she has been diagnosed with " Autism " in the past . As per staff, has presented depressed, anxious,intermittently tearful,  needy at times, needing redirections from staff. Prior to admission was on Pristiq, Risperidone, Carbamazepine , Vyvanse .  Of note, states that these medications were new to her, started during recent admission. At this time denies suicidal or self injurious plans or intentions, reports depression, and reports feeling excessively sedated with current medication regimen.  Prior to admission ,was experiencing vague hallucinations which she describes as " shadows", at this time not internally preoccupied, does not endorse current psychotic symptoms.    Principal Problem: Schizoaffective disorder ([place]) Diagnosis:   Patient Active Problem List   Diagnosis Date Noted  . Borderline personality disorder ([place]) [F60.3] 12/20/2016  . Suicidal ideations [R45.851] 12/20/2016  . Schizoaffective disorder ([place]) [F25.9] 12/19/2016   Total Time spent with patient: 20 minutes   Past Medical History:  Past Medical History:  Diagnosis Date  . ADHD   . Autism   . Personality disorder ([place])   . PTSD (post-traumatic stress disorder)    History reviewed. No pertinent surgical history. Family  History:  Family History  Problem Relation Age of Onset  . Family history unknown: Yes   Social History:  History  Alcohol Use No     History  Drug Use No    Social History   Social History  . Marital status: Single    Spouse name: N/A  . Number of children: N/A  . Years of education: N/A   Social History Main Topics  . Smoking status: Never Smoker  . Smokeless tobacco: Never Used  . Alcohol use No  . Drug use: No  . Sexual activity: Not Asked   Other Topics Concern  . None   Social History Narrative  . None   Additional Social History:   Sleep: Good- patient states she is feeling excessively sleepy at times   Appetite:  Good  Current Medications: Current Facility-Administered Medications  Medication Dose Route Frequency Provider Last Rate Last Dose  . acetaminophen (TYLENOL) tablet 650 mg  650 mg Oral Q6H PRN Ethelene Hal, NP   650 mg at 12/19/16 2233  . alum & mag hydroxide-simeth (MAALOX/MYLANTA) 200-200-20 MG/5ML suspension 30 mL  30 mL Oral Q4H PRN Ethelene Hal, NP      . benztropine (COGENTIN) tablet 1 mg  1 mg Oral QHS Ethelene Hal, NP   1 mg at 12/20/16 2128  . carbamazepine (TEGRETOL XR) 12 hr tablet 100 mg  100 mg Oral BID Ethelene Hal, NP   100 mg at 12/21/16 8101  . hydrOXYzine (ATARAX/VISTARIL) tablet 25 mg  25 mg Oral Q6H PRN Ethelene Hal, NP      . magnesium hydroxide (MILK OF MAGNESIA) suspension 30 mL  30 mL Oral Daily PRN Ethelene Hal, NP      .  ondansetron (ZOFRAN) tablet 4 mg  4 mg Oral Q8H PRN Ethelene Hal, NP      . pantoprazole (PROTONIX) EC tablet 40 mg  40 mg Oral Daily Ethelene Hal, NP   40 mg at 12/21/16 0816  . risperiDONE (RISPERDAL) tablet 3 mg  3 mg Oral QHS Ethelene Hal, NP   3 mg at 12/20/16 2128  . traZODone (DESYREL) tablet 50 mg  50 mg Oral QHS PRN Ethelene Hal, NP   50 mg at 12/20/16 2128  . venlafaxine XR (EFFEXOR-XR) 24 hr capsule 150 mg   150 mg Oral Q breakfast Ethelene Hal, NP   150 mg at 12/21/16 7482    Lab Results: No results found for this or any previous visit (from the past 48 hour(s)).  Blood Alcohol level:  Lab Results  Component Value Date   ETH <10 70/78/6754    Metabolic Disorder Labs: No results found for: HGBA1C, MPG No results found for: PROLACTIN No results found for: CHOL, TRIG, HDL, CHOLHDL, VLDL, LDLCALC  Physical Findings: AIMS: Facial and Oral Movements Muscles of Facial Expression: None, normal Lips and Perioral Area: None, normal Jaw: None, normal Tongue: None, normal,Extremity Movements Upper (arms, wrists, hands, fingers): None, normal Lower (legs, knees, ankles, toes): None, normal, Trunk Movements Neck, shoulders, hips: None, normal, Overall Severity Severity of abnormal movements (highest score from questions above): None, normal Incapacitation due to abnormal movements: None, normal Patient's awareness of abnormal movements (rate only patient's report): No Awareness, Dental Status Current problems with teeth and/or dentures?: No Does patient usually wear dentures?: No  CIWA:    COWS:     Musculoskeletal: Strength & Muscle Tone: within normal limits Gait & Station: normal Patient leans: N/A  Psychiatric Specialty Exam: Physical Exam  ROS no chest pain, no dyspnea.   Blood pressure 108/70, pulse (!) 102, temperature 98 F (36.7 C), temperature source Oral, resp. rate 20, height 5' 2.5" (1.588 m), weight 104.3 kg (230 lb), SpO2 100 %.Body mass index is 41.4 kg/m.  General Appearance: Fairly Groomed  Eye Contact:  Good  Speech:  Slow  Volume:  Decreased  Mood:  Anxious and Depressed  Affect:  Constricted  Thought Process:  Linear and Descriptions of Associations: Intact  Orientation:  Other:  alert and attentive   Thought Content:  does not endorse hallucinations at this time, and does not appear internally preoccupied   Suicidal Thoughts:  No denies active  suicidal ideations, denies any homicidal ideations, contracts for safety on unit at present  Homicidal Thoughts:  No  Memory:  recent and remote fair  Judgement:  Fair  Insight:  Fair  Psychomotor Activity:  Decreased  Concentration:  Concentration: Good and Attention Span: Good  Recall:  Good  Fund of Knowledge:  Good  Language:  Good  Akathisia:  Negative  Handed:  Right  AIMS (if indicated):     Assets:  Desire for Improvement Resilience  ADL's:  Fair   Cognition:  WNL  Sleep:  Number of Hours: 6.5   Assessment - 21 year old female, presented due to depression, SI, and medication side effects ( excessive sedation) . Reports recent psychiatric admission to another hospital for depression. On unit presenting anxious, intermittently tearful, needing frequent reassurance from staff. Currently denies active SI.   Treatment Plan Summary: Inpatient admission - encourage group and milieu participation to work on coping skills  Treatment team working on disposition planning options  Continue Effexor XR 150 mgrs QDAY for  depression, anxiety  Decrease Risperidone to 1 mgrs QHS for mood disorder- psychosis. Rationale to taper medication is that patient is reporting excessive sedation. At this time discontinue Tegretol - rationale is that patient states she is tolerating this medication poorly and due to side effect profile/ concerns of potential teratogenic effects based on patient's gender/age.  Daily contact with patient to assess and evaluate symptoms and progress in treatment, Medication management, Plan inpatient treatment  and medications as below   Jenne Campus, MD 12/21/2016, 3:57 PM

## 2016-12-21 NOTE — Progress Notes (Signed)
D: Pt presents with a sad affect and depressed mood. Pt rates depression 5/10. Anxiety 7/10. On approach, Pt tearful, constantly worrying and expressing neg thoughts. Pt expressed to Clinical research associate that she was talking to someone in the cafeteria and they told her to stop holding back and get out everything that's bothering her. Pt began crying uncontrollably. Pt verbalized that her mentor is not nice to her and often puts her down. Pt then stated that she's socially awkward and that the other pts on the unit told her that she's making them nervous. Pt verbalized that she will see people talking and then walk up to them and start talking which makes people uncomfortable. Pt noted to have off and on crying spells throughout the morning. Pt appears attention seeking and needy. Pt then verbalized to writer that she felt dizzy and weird. Pt b/p checked and stable. Pt given Gatorade. Pt stated that she told the doctor that Effexor and Tegretol are not good medicines for her and that it makes her feel weird. Pt then stated that she needs her ADHD medicine to help her focus. Pt requires frequent redirecting for disrupting the milieu and intrusive behaviors.  A: Medications reviewed with pt. Medications administered as ordered per MD. Verbal support provided. Pt encouraged to attend groups. Redirection provided as needed. 15 minute checks performed for safety. Feliz Beam, NP., made aware of pt complaints. R: Pt stated goal "talk to psychiatrist".

## 2016-12-21 NOTE — Progress Notes (Signed)
D: Pt denies SI/HI/AH. Endorsed VH- shadows Pt is pleasant and cooperative. Pt appears very depressed, pt very circumstantial and blaming others. Pt appears to want to get herself together, but appears to have an issue with coping/ management skills.   A: Pt was offered support and encouragement. Pt was given scheduled medications. Pt was encourage to attend groups. Q 15 minute checks were done for safety.   R:Pt  interacts well with peers and staff. Pt is taking medication. Pt has no complaints.Pt receptive to treatment and safety maintained on unit.

## 2016-12-21 NOTE — Progress Notes (Signed)
Attempted to collect UA. Pt stated that she's unable to urinate at this time.

## 2016-12-21 NOTE — Social Work (Signed)
Referred to [facility], is [place] resident.  Riyan Gavina, LCSW Lead Clinical Social Worker Phone:  [phone]  

## 2016-12-21 NOTE — [facility] Group Notes (Signed)
LCSW Group Therapy Note   12/21/2016 1:15pm   Type of Therapy and Topic:  Group Therapy:  Overcoming Obstacles   Participation Level:  Did Not Attend   Description of Group:    In this group patients will be encouraged to explore what they see as obstacles to their own wellness and recovery. They will be guided to discuss their thoughts, feelings, and behaviors related to these obstacles. The group will process together ways to cope with barriers, with attention given to specific choices patients can make. Each patient will be challenged to identify changes they are motivated to make in order to overcome their obstacles. This group will be process-oriented, with patients participating in exploration of their own experiences as well as giving and receiving support and challenge from other group members.   Therapeutic Goals: 1. Patient will identify personal and current obstacles as they relate to admission. 2. Patient will identify barriers that currently interfere with their wellness or overcoming obstacles.  3. Patient will identify feelings, thought process and behaviors related to these barriers. 4. Patient will identify two changes they are willing to make to overcome these obstacles:         Therapeutic Modalities:   Cognitive Behavioral Therapy Solution Focused Therapy Motivational Interviewing Relapse Prevention Therapy  Verdene Lennert, LCSW 12/21/2016 5:03 PM

## 2016-12-22 LAB — PREGNANCY, URINE: PREG TEST UR: NEGATIVE

## 2016-12-22 MED ORDER — AMPHETAMINE-DEXTROAMPHETAMINE 10 MG PO TABS
10.0000 mg | ORAL_TABLET | Freq: Every day | ORAL | Status: DC
  Administered 2016-12-23: 10 mg via ORAL
  Filled 2016-12-22: qty 1

## 2016-12-22 NOTE — Progress Notes (Signed)
Pt woke up stating she had a reoccuring nightmare that she usually have. Pt was verbally reassured, given 25 mg vistaril . Pt appeared calm and stated she would try to lay down and get some sleep

## 2016-12-22 NOTE — [facility] Group Notes (Signed)
LCSW Group Therapy Note   12/22/2016 1:15pm   Type of Therapy and Topic:  Group Therapy:  Positive Affirmations   Participation Level:  Did Not Attend  Description of Group: This group addressed positive affirmation toward self and others. Patients went around the room and identified two positive things about themselves and two positive things about a peer in the room. Patients reflected on how it felt to share something positive with others, to identify positive things about themselves, and to hear positive things from others. Patients were encouraged to have a daily reflection of positive characteristics or circumstances.  Therapeutic Goals 1. Patient will verbalize two of their positive qualities 2. Patient will demonstrate empathy for others by stating two positive qualities about a peer in the group 3. Patient will verbalize their feelings when voicing positive self affirmations and when voicing positive affirmations of others 4. Patients will discuss the potential positive impact on their wellness/recovery of focusing on positive traits of self and others. Summary of Patient Progress:    Therapeutic Modalities Cognitive Behavioral Therapy Motivational Interviewing  Ida Rogue, [place] 12/22/2016 2:44 PM

## 2016-12-22 NOTE — Progress Notes (Signed)
D: Pt denies SI/HI/AH, +ve VH- shadows. Pt is pleasant and cooperative. Pt stated she slept most of the day, pt very playful and joking with writer appearing to be in a good mood this evening.   A: Pt was offered support and encouragement. Pt was given scheduled medications. Pt was encourage to attend groups. Q 15 minute checks were done for safety.   R: Pt is taking medication. Pt receptive to treatment and safety maintained on unit.

## 2016-12-22 NOTE — Progress Notes (Signed)
Recreation Therapy Notes  Date: 12/22/16 Time: 1000 Location: [address] Dayroom  Group Topic: Coping Skills  Goal Area(s) Addresses:  Patients will be able to identify positive coping skills. Patients will be able to identify the benefits of coping skills. Patients will be able to identify benefits of using coping skills post d/c.  Intervention: Worksheet, pens  Activity: Mind map.  Patients were given a blank mind map.  LRT and patients filled in the first eight boxes together.  Patients identified depression, anxiety, loneliness, homeless, anger, hearing voices, seeing things and overdose as things we need coping skills for.  Patients were to then come up with three coping skills for each of the eight problems identified.  Education: Pharmacologist, Building control surveyor.   Education Outcome: Acknowledges understanding/In group clarification offered/Needs additional education.   Clinical Observations/Feedback: Pt did not attend group.     Caroll Rancher, LRT/CTRS         Caroll Rancher A 12/22/2016 12:19 PM

## 2016-12-22 NOTE — Progress Notes (Signed)
D: Pt presents with a flat affect and depressed mood. Pt noted to be less anxious, intrusive and labile today. Pt stated that she feels tired today and believes it because of her meds. Pt noted to be sleeping on and off throughout the morning and afternoon. Pt rates depression 5/10. Anxiety 6/10. Pt denies SI. Pt reports difficulty sleeping last night due to nightmares.  Dr. Jama Flavors made aware of pt concerns in treatment team. A: Medications reviewed with pt. Medications administered as ordered per MD. Verbal support provided. Pt encouraged to attend groups. 15 minute checks performed for safety. R: Pt complaint with tx.

## 2016-12-22 NOTE — Progress Notes (Signed)
[facility] MD Progress Note  12/22/2016 2:51 PM Tamara [name]  MRN:  [id number] Subjective:  States she continues to feel " tired", and complains of feeling intermittently dizzy and lightheaded. States she did not sleep well last night because of roommate " snoring ", and because of nightmares. Denies any suicidal ideations.. Objective :I have reviewed case with treatment team and have met with patient. As per staff report patient has presented less labile, less intrusive, requiring less redirection and reassurance. She has continued to present depressed . Patient reports that she is feeling " a little better", but describes ongoing anxiety, depressed . Denies suicidal ideations, denies hallucinations. Patient has reported feeling overmedicated, sedated from medications . Today does present fully alert and attentive. Has been on Risperidone x 2-3 weeks, and is currently on low dose . She is now off Carbamazepine.  No disruptive or agitated behaviors on unit, going to some groups .  Labs- pregnancy test was negative.  Principal Problem: Schizoaffective disorder ([place]) Diagnosis:   Patient Active Problem List   Diagnosis Date Noted  . Borderline personality disorder ([place]) [F60.3] 12/20/2016  . Suicidal ideations [R45.851] 12/20/2016  . Schizoaffective disorder ([place]) [F25.9] 12/19/2016   Total Time spent with patient: 20 minutes   Past Medical History:  Past Medical History:  Diagnosis Date  . ADHD   . Autism   . Personality disorder ([place])   . PTSD (post-traumatic stress disorder)    History reviewed. No pertinent surgical history. Family History:  Family History  Problem Relation Age of Onset  . Family history unknown: Yes   Social History:  History  Alcohol Use No     History  Drug Use No    Social History   Social History  . Marital status: Single    Spouse name: N/A  . Number of children: N/A  . Years of education: N/A   Social History Main Topics  . Smoking  status: Never Smoker  . Smokeless tobacco: Never Used  . Alcohol use No  . Drug use: No  . Sexual activity: Not Asked   Other Topics Concern  . None   Social History Narrative  . None   Additional Social History:   Sleep: Fair- patient states she is feeling excessively sleepy at times   Appetite:  Good  Current Medications: Current Facility-Administered Medications  Medication Dose Route Frequency Provider Last Rate Last Dose  . acetaminophen (TYLENOL) tablet 650 mg  650 mg Oral Q6H PRN Ethelene Hal, NP   650 mg at 12/19/16 2233  . alum & mag hydroxide-simeth (MAALOX/MYLANTA) 200-200-20 MG/5ML suspension 30 mL  30 mL Oral Q4H PRN Ethelene Hal, NP      . benztropine (COGENTIN) tablet 1 mg  1 mg Oral QHS Ethelene Hal, NP   1 mg at 12/21/16 2123  . hydrOXYzine (ATARAX/VISTARIL) tablet 25 mg  25 mg Oral Q6H PRN Ethelene Hal, NP   25 mg at 12/22/16 0344  . magnesium hydroxide (MILK OF MAGNESIA) suspension 30 mL  30 mL Oral Daily PRN Ethelene Hal, NP      . ondansetron [facility]) tablet 4 mg  4 mg Oral Q8H PRN Ethelene Hal, NP      . pantoprazole (PROTONIX) EC tablet 40 mg  40 mg Oral Daily Ethelene Hal, NP   40 mg at 12/22/16 8016  . risperiDONE (RISPERDAL) tablet 1 mg  1 mg Oral QHS Cobos, Myer Peer, MD   1 mg at 12/21/16  2123  . traZODone (DESYREL) tablet 50 mg  50 mg Oral QHS PRN Ethelene Hal, NP   50 mg at 12/21/16 2123  . venlafaxine XR (EFFEXOR-XR) 24 hr capsule 150 mg  150 mg Oral Q breakfast Ethelene Hal, NP   150 mg at 12/22/16 1660    Lab Results:  Results for orders placed or performed during the hospital encounter of 12/19/16 (from the past 48 hour(s))  Pregnancy, urine     Status: None   Collection Time: 12/21/16  8:52 PM  Result Value Ref Range   Preg Test, Ur NEGATIVE NEGATIVE    Comment:        THE SENSITIVITY OF THIS METHODOLOGY IS >20 mIU/mL. Performed at [facility], [place] [address], [place], [place] [address]     Blood Alcohol level:  Lab Results  Component Value Date   ETH <10 03/17/3233    Metabolic Disorder Labs: No results found for: HGBA1C, MPG No results found for: PROLACTIN No results found for: CHOL, TRIG, HDL, CHOLHDL, VLDL, LDLCALC  Physical Findings: AIMS: Facial and Oral Movements Muscles of Facial Expression: None, normal Lips and Perioral Area: None, normal Jaw: None, normal Tongue: None, normal,Extremity Movements Upper (arms, wrists, hands, fingers): None, normal Lower (legs, knees, ankles, toes): None, normal, Trunk Movements Neck, shoulders, hips: None, normal, Overall Severity Severity of abnormal movements (highest score from questions above): None, normal Incapacitation due to abnormal movements: None, normal Patient's awareness of abnormal movements (rate only patient's report): No Awareness, Dental Status Current problems with teeth and/or dentures?: No Does patient usually wear dentures?: No  CIWA:    COWS:     Musculoskeletal: Strength & Muscle Tone: within normal limits Gait & Station: normal Patient leans: N/A  Psychiatric Specialty Exam: Physical Exam  ROS no chest pain, no dyspnea. Denies nausea, no vomiting, no diarrhea, no rash   Blood pressure 114/66, pulse (!) 102, temperature 98 F (36.7 C), temperature source Oral, resp. rate 20, height 5' 2.5" (1.588 m), weight 104.3 kg (230 lb), SpO2 100 %.Body mass index is 41.4 kg/m.  General Appearance: improved grooming   Eye Contact:  Good  Speech:  Normal Rate  Volume:  Normal  Mood:  partially improved, states mood improving, reports she continues to feel anxious   Affect:  appropriate, more reactive, vaguely anxious   Thought Process:  Linear and Descriptions of Associations: Intact  Orientation:  Other:  alert and attentive   Thought Content:  denies auditory hallucinations, reports intermittently seeing " shadows", but states this is  intermittent, not internally preoccupied at this time  Suicidal Thoughts:  No denies active suicidal ideations, denies any homicidal ideations, contracts for safety on unit at present  Homicidal Thoughts:  No  Memory:  recent and remote grossly intact   Judgement:  Other:  improving   Insight:  Fair  Psychomotor Activity:  Decreased and but improving  Concentration:  Concentration: Good and Attention Span: Good  Recall:  Good  Fund of Knowledge:  Good  Language:  Good  Akathisia:  Negative  Handed:  Right  AIMS (if indicated):     Assets:  Desire for Improvement Resilience  ADL's:  Improving   Cognition:  WNL  Sleep:  Number of Hours: 5.75   Assessment - patient presents with partial improvement, and today presents with improved eye contact, improved speech and a more reactive affect . She remains vaguely depressed and anxious. Denies suicidal ideations.  Of note, patient states she  has history of ADHD for which she was taking both Adderall and Vyvanse prior to admission. She was taking these medications with Pristiq, without side effects - states " I feel much better, because I feel my energy is better and I can concentrate more".   Treatment Plan Summary: Treatment plan reviewed as below today 10/16 . Continue to encourage group and milieu participation to work on Radiographer, therapeutic  Treatment team working on disposition planning options  Continue Effexor XR 150 mgrs QDAY for depression, anxiety  Continue  Risperidone  1 mgrs QHS for mood disorder- psychosis. Restart Adderall 10 mgrs QAM for ADHD   Daily contact with patient to assess and evaluate symptoms and progress in treatment, Medication management, Plan inpatient treatment  and medications as below   Jenne Campus, MD 12/22/2016, 2:51 PM   Patient ID: Tamara [name], female   DOB: 23-Aug-1995, 21 y.o.   MRN: [id number]

## 2016-12-22 NOTE — Progress Notes (Signed)
Pt did not attend wrap up group this evening.  

## 2016-12-23 DIAGNOSIS — G4700 Insomnia, unspecified: Secondary | ICD-10-CM

## 2016-12-23 MED ORDER — HYDROXYZINE HCL 25 MG PO TABS: 25.0000 mg | ORAL_TABLET | Freq: Four times a day (QID) | ORAL | 0 refills | Status: DC | PRN

## 2016-12-23 MED ORDER — RISPERIDONE 1 MG PO TABS: 1.0000 mg | ORAL_TABLET | Freq: Every day | ORAL | 0 refills | Status: DC

## 2016-12-23 MED ORDER — VENLAFAXINE HCL ER 150 MG PO CP24: 150.0000 mg | ORAL_CAPSULE | Freq: Every day | ORAL | 0 refills | Status: DC

## 2016-12-23 MED ORDER — TRAZODONE HCL 50 MG PO TABS: 50.0000 mg | ORAL_TABLET | Freq: Every evening | ORAL | 0 refills | Status: DC | PRN

## 2016-12-23 MED ORDER — BENZTROPINE MESYLATE 1 MG PO TABS: 1.0000 mg | ORAL_TABLET | Freq: Every day | ORAL | 0 refills | Status: DC

## 2016-12-23 MED ORDER — PANTOPRAZOLE SODIUM 40 MG PO TBEC: 40.0000 mg | DELAYED_RELEASE_TABLET | Freq: Every day | ORAL | Status: DC

## 2016-12-23 MED ORDER — AMPHETAMINE-DEXTROAMPHETAMINE 10 MG PO TABS: ORAL_TABLET | ORAL | Status: DC

## 2016-12-23 NOTE — [facility] Group Notes (Signed)
LCSW Group Therapy Note  12/23/2016 1:15pm  Type of Therapy/Topic:  Group Therapy:  Balance in Life  Participation Level:  Did Not Attend  Description of Group:    This group will address the concept of balance and how it feels and looks when one is unbalanced. Patients will be encouraged to process areas in their lives that are out of balance and identify reasons for remaining unbalanced. Facilitators will guide patients in utilizing problem-solving interventions to address and correct the stressor making their life unbalanced. Understanding and applying boundaries will be explored and addressed for obtaining and maintaining a balanced life. Patients will be encouraged to explore ways to assertively make their unbalanced needs known to significant others in their lives, using other group members and facilitator for support and feedback.  Therapeutic Goals: 1. Patient will identify two or more emotions or situations they have that consume much of in their lives. 2. Patient will identify signs/triggers that life has become out of balance:  3. Patient will identify two ways to set boundaries in order to achieve balance in their lives:  4. Patient will demonstrate ability to communicate their needs through discussion and/or role plays  Summary of Patient Progress:      Therapeutic Modalities:   Cognitive Behavioral Therapy Solution-Focused Therapy Assertiveness Training  Carlynn Heraldngel M Aryan Sparks, Student-Social Work 12/23/2016 1:38 PM

## 2016-12-23 NOTE — Progress Notes (Signed)
Recreation Therapy Notes  Date: 12/23/16 Time: 1000 Location: [address] Dayroom  Group Topic: Communication, Team Building, Problem Solving  Goal Area(s) Addresses:  Patient will effectively work with peer towards shared goal.  Patient will identify skills used to make activity successful.  Patient will identify how skills used during activity can be used to reach post d/c goals.   Intervention: STEM Activity  Activity: Stage managerLanding Pad. In teams patients were given 12 plastic drinking straws and a length of masking tape. Using the materials provided patients were asked to build a landing pad to catch a golf ball dropped from approximately 6 feet in the air.   Education: Pharmacist, communityocial Skills, Discharge Planning   Education Outcome: Acknowledges education/In group clarification offered/Needs additional education.   Clinical Observations/Feedback:  Pt did not attend group.     Caroll RancherMarjette Lynell Kussman, LRT/CTRS         Caroll RancherLindsay, Tylisha Danis A 12/23/2016 12:52 PM

## 2016-12-23 NOTE — Progress Notes (Signed)
Patient ID: Tamara [name], female   DOB: [birth year], 21 y.o.   MRN: [id number]  Discharge Note- Belongings returned to patient at time of discharge. Patient denies any pain or discomfort. Discharge instructions and medications were reviewed with patient. Patient verbalized understanding of both medications and discharge instructions. Patient discharged to lobby with directions to the bus stop. Patient discharged with no apparent distress. Q15 minute safety checks maintained until time of discharge.

## 2016-12-23 NOTE — Progress Notes (Signed)
Patient ID: Tamara MoritaQueenie Rhea [name], female   DOB: [birth year], 21 y.o.   MRN: [id number]  DAR: Pt. Denies SI/HI and A/V Hallucinations. She reports that her sleep last night was fair, her appetite is good, her energy level is normal, and her concentration is good. She rates her depression 5/10, hopelessness 5/10, and anxiety 6/10. Patient does report some pain and received PRN Tylenol which provided some relief. Support and encouragement provided to the patient. Scheduled medications administered to patient per physician's orders. Patient is receptive and cooperative. She is seen in the milieu as the morning progresses. She reports that she is "excited" about discharge. Q15 minute checks are maintained for safety.

## 2016-12-23 NOTE — Progress Notes (Signed)
  [facility] :  Will you be returning to the same living situation after discharge:  Yes,  home At discharge, do you have transportation home?: Yes,  bus pass Do you have the ability to pay for your medications: Yes,  MCD  Release of information consent forms completed and in the chart;  Patient's signature needed at discharge.  Patient to Follow up at: Follow-up Information    [facility] Go to.   Specialty:  Professional Counselor Why:  You will need to call to set up your next appointment, or, go to your next regularly scheduled appointment Contact information: [address] [place] [address] [phone]        [facility]. Go on 12/25/2016.   Specialty:  Behavioral Health Why:  [facility] 12:30 [facility] Contact information: [address] [place] [address] [phone]        [facility] Follow up.   Specialty:  Behavioral Health Why:  [name] [facility] will work with you when you discharge.  You can reach her at [phone] Contact information: [address] [place] [address] [phone]           Next level of care provider has [facility] Link:no  Safety Planning and Suicide Prevention discussed: Yes,  yes  Have you used any form of tobacco in the last 30 days? (Cigarettes, Smokeless Tobacco, Cigars, and/or Pipes): No  Has patient been referred to the Quitline?: N/A patient is not a smoker  Patient has been referred for addiction treatment: N/A  Tamara [name] B Tamara Gens, LCSW 12/23/2016, 11:11 AM

## 2016-12-23 NOTE — Social Work (Signed)
Patient accepted Transitional Care Team.  Santa GeneraAnne Cunningham, LCSW Lead Clinical Social Worker Phone:  [phone]

## 2016-12-23 NOTE — Discharge Summary (Signed)
Physician Discharge Summary Note  Patient:  Tamara [name] is an 21 y.o., female MRN:  [id number] DOB:  1995-09-30 Patient phone:  [phone] (home)  Patient address:   [address] [place] [address],  Total Time spent with patient: Greater than 30 minutes  Date of Admission:  12/19/2016 Date of Discharge: 12-23-16  Reason for Admission: Feeling of things not real, visual hallucinations & chronic suicidal ideations.  Principal Problem: Schizoaffective disorder [facility])  Discharge Diagnoses: Patient Active Problem List   Diagnosis Date Noted  . Borderline personality disorder (HCC) [F60.3] 12/20/2016  . Suicidal ideations [R45.851] 12/20/2016  . Schizoaffective disorder (HCC) [F25.9] 12/19/2016   Past Psychiatric History: Schizoaffective disorder,   Past Medical History:  Past Medical History:  Diagnosis Date  . ADHD   . Autism   . Personality disorder (HCC)   . PTSD (post-traumatic stress disorder)    History reviewed. No pertinent surgical history.  Family History:  Family History  Problem Relation Age of Onset  . Family history unknown: Yes   Family Psychiatric  History: See H&P.  Social History:  History  Alcohol Use No     History  Drug Use No    Social History   Social History  . Marital status: Single    Spouse name: N/A  . Number of children: N/A  . Years of education: N/A   Social History Main Topics  . Smoking status: Never Smoker  . Smokeless tobacco: Never Used  . Alcohol use No  . Drug use: No  . Sexual activity: Not Asked   Other Topics Concern  . None   Social History Narrative  . None   Hospital Course: (Per Md's SRA): 21 y.o female, single, in college. Took the semester off. Background history of Borderline PD and Schizoaffective disorder. Self presented to the ER unaccompanied. Says she was referred by her therapist. Reports chronic feelings as if things are not real. Reports chronic suicidal thoughts.  Has not been sleeping well lately. Sees shadows and figures. Recently discharged from [facility] inpatient care. On multiple medications including stimulants. Routine labs shows insignificant mild leucocytosis. UDS is negative. BAL<5 mg/dl. Reports chaotic relationship with her mom. Reports chaotic relationship with one of her roommates. Says she feels as if she is "drunk" on her medications. She denies any suicidal thoughts. No violent thoughts. No homicidal thoughts. She does not have access to weapons. No substance use. Very focused on taking stimulants. Medication adjustments discussed. Would hold off stimulants and taper daytime Risperidone. Patient consented to treatment.   After the above admission notes, Delia was started on the medication regimen for her presenting symptoms. She received & was discharged on Risperdal 1 mg for mood control, Effexor XR150 mg for depression, Trazodone 50 mg for insomnia, Cogentin 1 mg for EPS, Adderall 10 mg for ADHD & Hydroxyzine 25 mg prn for anxiety. She was also enrolled & participated in the group counseling sessions being offered & held on this unit. She presented other significant health issues that requires treatment & was resumed on all her pertinent home medications for those health issues. She tolerated her treatment regimen without any adverse effects or reactions reported.  At her discharge assessment/interview today with her attending psychiatrist, Matalyn reports feeling much better. She states that being on the medications has helped her a lot. She says she has been feeling any delusions or paranoia. She says she feels she is back to her old self. No thoughts of suicide.  No thoughts of homicide. No thoughts of violence. No access to weapons.    Korra reports that she is in good spirits. Not feeling depressed. Reports normal energy and interest. Has been maintaining normal biological functions. She is able to think clearly. She is able to focus on  task. Her thoughts are not crowded or racing. No evidence of mania. No hallucination in any modality. She is not making any delusional statement. No passivity of will/thought. She is fully in touch with reality. No overwhelming anxiety.  Vear's case was discussed at the treatment team meeting today. The nursing staff notes that she has been bright today. She has not been observed to be internally disturbed. She has not voiced any suicidal thoughts today. Patient has been cooperative with care and has tolerated her medications well.  Team members feel that patient is back to her baseline level of function. Team agrees with plan to discharge patient today to continue mental health care on an outpatient basis as noted below. She was provided with all the necessary information needed to make these appointments without problems. She left [facility] with all personal belongings in no apparent distress. Transportation per city bus. [facility] assisted with bus pass.   Physical Findings: AIMS: Facial and Oral Movements Muscles of Facial Expression: None, normal Lips and Perioral Area: None, normal Jaw: None, normal Tongue: None, normal,Extremity Movements Upper (arms, wrists, hands, fingers): None, normal Lower (legs, knees, ankles, toes): None, normal, Trunk Movements Neck, shoulders, hips: None, normal, Overall Severity Severity of abnormal movements (highest score from questions above): None, normal Incapacitation due to abnormal movements: None, normal Patient's awareness of abnormal movements (rate only patient's report): No Awareness, Dental Status Current problems with teeth and/or dentures?: No Does patient usually wear dentures?: No  CIWA:    COWS:     Musculoskeletal: Strength & Muscle Tone: within normal limits Gait & Station: normal Patient leans: N/A  Psychiatric Specialty Exam: Physical Exam  Constitutional: She appears well-developed.  HENT:  Head: Normocephalic.  Eyes: Pupils are equal,  round, and reactive to light.  Neck: Normal range of motion.  Cardiovascular: Normal rate.   Respiratory: Effort normal.  GI: Soft.  Genitourinary:  Genitourinary Comments: Deferred  Musculoskeletal: Normal range of motion.  Neurological: She is alert.  Skin: Skin is warm.    Review of Systems  Constitutional: Negative.   HENT: Negative.   Eyes: Negative.   Respiratory: Negative.   Cardiovascular: Negative.   Gastrointestinal: Negative.   Genitourinary: Negative.   Musculoskeletal: Negative.   Skin: Negative.   Neurological: Negative.   Endo/Heme/Allergies: Negative.   Psychiatric/Behavioral: Positive for depression (Stable) and hallucinations (Hx. Psychosis). Negative for memory loss, substance abuse and suicidal ideas. The patient has insomnia (Stable). The patient is not nervous/anxious.     Blood pressure 121/72, pulse 87, temperature 97.8 F (36.6 C), temperature source Oral, resp. rate 18, height 5' 2.5" (1.588 m), weight 104.3 kg (230 lb), SpO2 100 %.Body mass index is 41.4 kg/m.  See Md's SRA   Have you used any form of tobacco in the last 30 days? (Cigarettes, Smokeless Tobacco, Cigars, and/or Pipes): No  Has this patient used any form of tobacco in the last 30 days? (Cigarettes, Smokeless Tobacco, Cigars, and/or Pipes): N/A  Blood Alcohol level:  Lab Results  Component Value Date   ETH <10 12/18/2016   Metabolic Disorder Labs:  No results found for: HGBA1C, MPG No results found for: PROLACTIN No results found for: CHOL, TRIG, HDL, CHOLHDL, VLDL, LDLCALC  See Psychiatric Specialty Exam and Suicide Risk Assessment completed by Attending Physician prior to discharge.  Discharge destination:  Home  Is patient on multiple antipsychotic therapies at discharge:  No   Has Patient had three or more failed trials of antipsychotic monotherapy by history:  No  Recommended Plan for Multiple Antipsychotic Therapies: NA  Allergies as of 12/23/2016      Reactions    Beef Extract Itching   Other Other (See Comments), Swelling   Uncoded Allergy. Allergen: shrimp, Other Reaction: ITCHY   Asa [aspirin]    "PASSED OUT"   Lithium Hives      Medication List    STOP taking these medications   ADDERALL 20 MG tablet Generic drug:  amphetamine-dextroamphetamine Replaced by:  amphetamine-dextroamphetamine 10 MG tablet   desvenlafaxine 50 MG 24 hr tablet Commonly known as:  PRISTIQ   EQUETRO 100 MG Cp12 12 hr capsule Generic drug:  Carbamazepine   lisdexamfetamine 60 MG capsule Commonly known as:  VYVANSE   Melatonin 3 MG Tabs   PRISTIQ 100 MG 24 hr tablet Generic drug:  desvenlafaxine Replaced by:  venlafaxine XR 150 MG 24 hr capsule     TAKE these medications     Indication  amphetamine-dextroamphetamine 10 MG tablet Commonly known as:  ADDERALL Take 10 MG by mouth daily: For ADHD Replaces:  ADDERALL 20 MG tablet  Indication:  Attention Deficit Hyperactivity Disorder   benztropine 1 MG tablet Commonly known as:  COGENTIN Take 1 tablet (1 mg total) by mouth at bedtime. For prevention of drug induced tremors What changed:  additional instructions  Indication:  Extrapyramidal Reaction caused by Medications   hydrOXYzine 25 MG tablet Commonly known as:  ATARAX/VISTARIL Take 1 tablet (25 mg total) by mouth every 6 (six) hours as needed for anxiety.  Indication:  Feeling Anxious   pantoprazole 40 MG tablet Commonly known as:  PROTONIX Take 1 tablet (40 mg total) by mouth daily. For acid reflux What changed:  additional instructions  Indication:  Gastroesophageal Reflux Disease   risperiDONE 1 MG tablet Commonly known as:  RISPERDAL Take 1 tablet (1 mg total) by mouth at bedtime. For mood control What changed:  medication strength  how much to take  additional instructions  Indication:  Mood control   traZODone 50 MG tablet Commonly known as:  DESYREL Take 1 tablet (50 mg total) by mouth at bedtime as needed for sleep.   Indication:  Trouble Sleeping   venlafaxine XR 150 MG 24 hr capsule Commonly known as:  EFFEXOR-XR Take 1 capsule (150 mg total) by mouth daily with breakfast. For depression Replaces:  PRISTIQ 100 MG 24 hr tablet  Indication:  Major Depressive Disorder      Follow-up Information    [facility] Go to.   Specialty:  Professional Counselor Why:  Weekday CSW will acquire appointment with therapist Marinell Blight prior to DC Contact information: [address] [place] [address] [phone]        [facility] Go on 12/25/2016.   Specialty:  Behavioral Health Why:  Friday at 12:30 with [facility] information: [address] [place] [address] [phone]        [facility] Follow up.   Specialty:  Behavioral Health Why:  Will be followed by [facility] Team for case management at discharge; services begin on day of discharge.  Contact information: [address] [place] [address] [phone]  Follow-up recommendations: Activity:  As tolerated Diet: As recommended by your primary care doctor. Keep all scheduled follow-up appointments as recommended.   Comments: Patient is instructed prior to discharge to: Take all medications as prescribed by his/her mental healthcare provider. Report any adverse effects and or reactions from the medicines to his/her outpatient provider promptly. Patient has been instructed & cautioned: To not engage in alcohol and or illegal drug use while on prescription medicines. In the event of worsening symptoms, patient is instructed to call the crisis hotline, 911 and or go to the nearest ED for appropriate evaluation and treatment of symptoms. To follow-up with his/her primary care provider for your other medical issues, concerns and or health care needs.   Signed: Sanjuana KavaNwoko, Agnes I, NP, PMHNP, FNP-BC 12/23/2016, 10:20 AM   Patient seen, Suicide  Assessment Completed.  Disposition Plan Reviewed   Pt is a 21 y/o F referred to inpatient from outpatient therapist after presenting with worsening sedation associated with medication use. Pt was stabilized on inpatient unit with reduced dose of risperdal.  Today upon evaluation, pt reports feeling improvement of energy level and mood symptoms. She denies SI/HI/AH/VH. She feels alert and has no concerns about discharging. She expresses future orientation regarding restarting her college coursework in biochemistry. She expresses some concern about stressors from her roommates, but she offered several coping mechanisms such as reaching out to her fiance or therapist. She was able to articulate a well developed safety plan including to contact emergency services should she feel unable to maintain her own safety.  Pt is in agreement to continue current regimen of Pristiq 50mg  qDay, Risperdal 1mg  qhs, and Adderall 10mg  qDay. She will be given a 2-day supply until her next outpatient follow up appointment. Pt was in agreement with this plan and had no further questions, comments, or concerns.   Plan Of Care/Follow-up recommendations:  -Cont. Pristiq 50mg  qDay, Risperdal 1mg  qhs, and adderral 10mg  qDay - Outpt follow up as above  Activity:  as tolerated Diet:  normal Tests:  N/A Other:  see above for full discharge plan  Micheal Likenshristopher T Mairely Foxworth, MD

## 2016-12-23 NOTE — [facility] Suicide Risk Assessment (Signed)
[facility] Discharge Suicide Risk Assessment   Principal Problem: Schizoaffective disorder [facility]) Discharge Diagnoses:  Patient Active Problem List   Diagnosis Date Noted  . Borderline personality disorder (HCC) [F60.3] 12/20/2016  . Suicidal ideations [R45.851] 12/20/2016  . Schizoaffective disorder (HCC) [F25.9] 12/19/2016    Total Time spent with patient: 30 minutes  Musculoskeletal: Strength & Muscle Tone: within normal limits Gait & Station: normal Patient leans: N/A  Psychiatric Specialty Exam: Review of Systems  Constitutional: Negative.   Cardiovascular: Negative.   Gastrointestinal: Negative.   Skin: Negative.   Neurological: Negative.     Blood pressure 121/72, pulse 87, temperature 97.8 F (36.6 C), temperature source Oral, resp. rate 18, height 5' 2.5" (1.588 m), weight 104.3 kg (230 lb), SpO2 100 %.Body mass index is 41.4 kg/m.  General Appearance: Fairly Groomed  Patent attorney::  Good  Speech:  Clear and Coherent409  Volume:  Normal  Mood:  Euthymic  Affect:  Flat  Thought Process:  Coherent and Goal Directed  Orientation:  Full (Time, Place, and Person)  Thought Content:  Logical  Suicidal Thoughts:  No  Homicidal Thoughts:  No  Memory:  Immediate;   Good Recent;   Good Remote;   Good  Judgement:  Good  Insight:  Good  Psychomotor Activity:  Normal  Concentration:  Good  Recall:  Good  Fund of Knowledge:Good  Language: Good  Akathisia:  No  Handed:    AIMS (if indicated):     Assets:  Communication Skills Desire for Improvement Social Support Vocational/Educational  Sleep:  Number of Hours: 6.75  Cognition: WNL  ADL's:  Intact   Mental Status Per Nursing Assessment::   On Admission:     Demographic Factors:  Adolescent or young adult  Loss Factors: Decrease in vocational status  Historical Factors: Prior suicide attempts  Risk Reduction Factors:   Sense of responsibility to family and Living with another person, especially a  relative  Continued Clinical Symptoms:  Severe Anxiety and/or Agitation Depression:   Anhedonia  Cognitive Features That Contribute To Risk:  None    Suicide Risk:  Minimal: No identifiable suicidal ideation.  Patients presenting with no risk factors but with morbid ruminations; may be classified as minimal risk based on the severity of the depressive symptoms  Follow-up Information    [facility] Go to.   Specialty:  Professional Counselor Why:  You will need to call to set up your next appointment, or, go to your next regularly scheduled appointment Contact information: [address] [place] [address] [phone]        [facility] Go on 12/25/2016.   Specialty:  Behavioral Health Why:  Friday at 12:30 with [facility] information: [address] [place] [address] [phone]        [facility] Follow up.   Specialty:  Behavioral Health Why:  Brenton Grills with [facility] Team will work with you when you discharge.  You can reach her at [phone] Contact information: [address] [place] [address] [phone]          Subjective data: Pt is a 21 y/o F referred to inpatient from outpatient therapist after presenting with worsening sedation associated with medication use. Pt was stabilized on inpatient unit with reduced dose of risperdal.  Today upon evaluation, pt reports feeling improvement of energy level and mood symptoms. She denies SI/HI/AH/VH. She feels alert and has no concerns about discharging. She  expresses future orientation regarding restarting her college coursework in biochemistry. She expresses some concern about stressors from her roommates, but she offered several coping mechanisms such as reaching out to her fiance or therapist. She was able to articulate a well developed safety plan including to contact emergency services should she feel unable to  maintain her own safety.  Pt is in agreement to continue current regimen of Pristiq 50mg  qDay, Risperdal 1mg  qhs, and Adderall 10mg  qDay. She will be given a 2-day supply until her next outpatient follow up appointment. Pt was in agreement with this plan and had no further questions, comments, or concerns.   Plan Of Care/Follow-up recommendations:  -Cont. Pristiq 50mg  qDay, Risperdal 1mg  qhs, and adderral 10mg  qDay - Outpt follow up as above  Activity:  as tolerated Diet:  normal Tests:  N/A Other:  see above for full discharge plan  Micheal Likenshristopher T Keiji Melland, MD 12/23/2016, 11:40 AM

## 2017-02-04 ENCOUNTER — Other Ambulatory Visit: Payer: Self-pay

## 2017-02-04 ENCOUNTER — Emergency Department (HOSPITAL_COMMUNITY)
Admission: EM | Admit: 2017-02-04 | Discharge: 2017-02-04 | Disposition: A | Discharge status: Home / Self Care | Payer: Medicaid Other | Attending: Emergency Medicine | Admitting: Emergency Medicine

## 2017-02-04 ENCOUNTER — Encounter (HOSPITAL_COMMUNITY): Payer: Self-pay | Admitting: Emergency Medicine

## 2017-02-04 ENCOUNTER — Emergency Department (HOSPITAL_COMMUNITY): Payer: Medicaid Other

## 2017-02-04 DIAGNOSIS — K8020 Calculus of gallbladder without cholecystitis without obstruction: Secondary | ICD-10-CM

## 2017-02-04 DIAGNOSIS — Z79899 Other long term (current) drug therapy: Secondary | ICD-10-CM | POA: Diagnosis not present

## 2017-02-04 DIAGNOSIS — R1013 Epigastric pain: Secondary | ICD-10-CM | POA: Insufficient documentation

## 2017-02-04 DIAGNOSIS — R112 Nausea with vomiting, unspecified: Secondary | ICD-10-CM | POA: Diagnosis present

## 2017-02-04 LAB — URINALYSIS, ROUTINE W REFLEX MICROSCOPIC
BILIRUBIN URINE: NEGATIVE
Glucose, UA: NEGATIVE mg/dL
Hgb urine dipstick: NEGATIVE
KETONES UR: NEGATIVE mg/dL
LEUKOCYTES UA: NEGATIVE
NITRITE: NEGATIVE
PROTEIN: NEGATIVE mg/dL
Specific Gravity, Urine: 1.027 (ref 1.005–1.030)
pH: 5 (ref 5.0–8.0)

## 2017-02-04 LAB — CBC
HCT: 44.4 % (ref 36.0–46.0)
Hemoglobin: 15.3 g/dL — ABNORMAL HIGH (ref 12.0–15.0)
MCH: 30.4 pg (ref 26.0–34.0)
MCHC: 34.5 g/dL (ref 30.0–36.0)
MCV: 88.3 fL (ref 78.0–100.0)
PLATELETS: 398 10*3/uL (ref 150–400)
RBC: 5.03 MIL/uL (ref 3.87–5.11)
RDW: 12.8 % (ref 11.5–15.5)
WBC: 15.7 10*3/uL — ABNORMAL HIGH (ref 4.0–10.5)

## 2017-02-04 LAB — COMPREHENSIVE METABOLIC PANEL
ALBUMIN: 4.2 g/dL (ref 3.5–5.0)
ALT: 19 U/L (ref 14–54)
AST: 21 U/L (ref 15–41)
Alkaline Phosphatase: 136 U/L — ABNORMAL HIGH (ref 38–126)
Anion gap: 9 (ref 5–15)
BUN: 10 mg/dL (ref 6–20)
CHLORIDE: 107 mmol/L (ref 101–111)
CO2: 21 mmol/L — AB (ref 22–32)
CREATININE: 0.81 mg/dL (ref 0.44–1.00)
Calcium: 9.9 mg/dL (ref 8.9–10.3)
GFR calc Af Amer: >60 mL/min (ref >60)
GFR calc non Af Amer: >60 mL/min (ref >60)
Glucose, Bld: 84 mg/dL (ref 65–99)
Potassium: 3.9 mmol/L (ref 3.5–5.1)
SODIUM: 137 mmol/L (ref 135–145)
Total Bilirubin: 0.3 mg/dL (ref 0.3–1.2)
Total Protein: 8.2 g/dL — ABNORMAL HIGH (ref 6.5–8.1)

## 2017-02-04 LAB — I-STAT BETA HCG BLOOD, ED (MC, WL, AP ONLY): I-stat hCG, quantitative: <5 m[IU]/mL (ref <5)

## 2017-02-04 LAB — LIPASE, BLOOD: LIPASE: 21 U/L (ref 11–51)

## 2017-02-04 MED ORDER — SUCRALFATE 1 G PO TABS: 1.0000 g | ORAL_TABLET | Freq: Three times a day (TID) | ORAL | 0 refills | Status: DC

## 2017-02-04 MED ORDER — SODIUM CHLORIDE 0.9 % IV BOLUS (SEPSIS)
1000.0000 mL | Freq: Once | INTRAVENOUS | Status: AC
  Administered 2017-02-04: 1000 mL via INTRAVENOUS

## 2017-02-04 MED ORDER — ONDANSETRON HCL 4 MG/2ML IJ SOLN
4.0000 mg | Freq: Once | INTRAMUSCULAR | Status: AC
  Administered 2017-02-04: 4 mg via INTRAVENOUS
  Filled 2017-02-04: qty 2

## 2017-02-04 MED ORDER — IOPAMIDOL (ISOVUE-300) INJECTION 61%
INTRAVENOUS | Status: AC
  Administered 2017-02-04: 100 mL
  Filled 2017-02-04: qty 100

## 2017-02-04 NOTE — Discharge Instructions (Signed)
Please see the information and instructions below regarding your visit.  Your diagnoses today include:  1. Abdominal pain, unspecified abdominal location   Your examination and testing is reassuring today.  There is no evidence of inflammation in the organs of your abdomen.  You do have gallstones which can cause some upper belly pain.  I would like you to follow-up with a general surgeon to do an examination on you to see if they would recommend further intervention to make you more comfortable.   Tests performed today include: See side panel of your discharge paperwork for testing performed today. Vital signs are listed at the bottom of these instructions.   Kidney function, liver function, blood counts, CT scan of the belly.  Medications prescribed:    Take any prescribed medications only as prescribed, and any over the counter medications only as directed on the packaging.  Carafate.  You may take this 3 times a day with food and at night.  This helps coat the stomach for any discomfort.  Home care instructions:  Please follow any educational materials contained in this packet.   Follow-up instructions: Please follow-up with your primary care provider as soon as possible for further evaluation of your symptoms if they are not completely improved.   Please follow up with Dr. Andrey CampanileWilson in general surgery.  Return instructions:  Please return to the Emergency Department if you experience worsening symptoms.  Please return to the emergency department if your symptoms worsen, if you develop nausea or vomiting that is so severe that you cannot keep anything down, fever or chills with your symptoms, or changing focal abdominal pain.  Please return for any vomiting large clots of blood or blood in stool. Please return if you have any other emergent concerns.  Additional Information:   Your vital signs today were: BP 107/69 (BP Location: Right Arm)    Pulse (!) 102    Temp 97.6 F (36.4  C) (Oral)    Resp 15    Ht 5\' 1"  (1.549 m)    Wt 111.6 kg (246 lb)    SpO2 99%    BMI 46.48 kg/m  If your blood pressure (BP) was elevated on multiple readings during this visit above 130 for the top number or above 80 for the bottom number, please have this repeated by your primary care provider within one month. --------------  Thank you for allowing us to participate in your care today.

## 2017-02-04 NOTE — ED Notes (Signed)
Pt. To CT via stretcher. 

## 2017-02-04 NOTE — ED Triage Notes (Signed)
Pt states she has been having n/v for a week. Pt states she cannot keep any food/water down. Pt's MD prescribed protonix, but pt feels this has not helped. Pt states she has been having bodyaches and migraines as well.

## 2017-02-04 NOTE — ED Provider Notes (Signed)
Physical Exam  BP 107/69 (BP Location: Right Arm)   Pulse (!) 102   Temp 97.6 F (36.4 C) (Oral)   Resp 15   Ht 5\' 1"  (1.549 m)   Wt 111.6 kg (246 lb)   SpO2 99%   BMI 46.48 kg/m   Physical Exam  Constitutional: She appears well-developed and well-nourished. No distress.  Sitting comfortably in bed.  HENT:  Head: Normocephalic and atraumatic.  Eyes: Conjunctivae are normal. Right eye exhibits no discharge. Left eye exhibits no discharge.  EOMs normal to gross examination.  Neck: Normal range of motion.  Cardiovascular: Normal rate and regular rhythm.  Intact, 2+ radial pulse.  Pulmonary/Chest:  Normal respiratory effort. Patient converses comfortably. No audible wheeze or stridor.  Abdominal: Soft. She exhibits no distension. There is tenderness. There is no guarding.  TTP in the RUQ more than epigastrium. Liver edge nonpalpable.  Musculoskeletal: Normal range of motion.  Neurological: She is alert.  Cranial nerves intact to gross observation. Patient moves extremities without difficulty.  Skin: Skin is warm and dry. She is not diaphoretic.  Psychiatric: She has a normal mood and affect. Her behavior is normal. Judgment and thought content normal.  Nursing note and vitals reviewed.   ED Course  Procedures  Assumed care from  Burna FortsJeff Hedges PA-C at 8:53 PM.  Briefly, the patient is a 21 y.o. female with PMHx of  has a past medical history of ADHD, Autism, Personality disorder (HCC), and PTSD (post-traumatic stress disorder). here with epigastric pain x 1 week.  Please refer to PA Hedges note for further details of the patient's initial presentation and evaluation.  Labs Reviewed  COMPREHENSIVE METABOLIC PANEL - Abnormal; Notable for the following components:      Result Value   CO2 21 (*)    Total Protein 8.2 (*)    Alkaline Phosphatase 136 (*)    All other components within normal limits  CBC - Abnormal; Notable for the following components:   WBC 15.7 (*)    Hemoglobin 15.3 (*)    All other components within normal limits  URINALYSIS, ROUTINE W REFLEX MICROSCOPIC - Abnormal; Notable for the following components:   APPearance HAZY (*)    All other components within normal limits  LIPASE, BLOOD  I-STAT BETA HCG BLOOD, ED ([facility], [facility], [facility] ONLY)    MDM  Patient is nontoxic-appearing, afebrile, and in no acute distress on my evaluation of the patient.  I gave her her CT results which demonstrated no evidence of organ inflammation, however she does have gallstones.  My abdominal examination was significant for right upper quadrant pain greater than epigastric pain.  I discussed with patient that there is no emergent cause of her symptoms, however some patients require cholecystectomy for symptomatic gallstones.  I provided [facility], the surgeon on call.  Patient case was discussed with Dr. Benjiman CoreNathan Pickering, and decision made not to pursue right upper quadrant ultrasound tonight with the diagnosed evidence of gallstones on CT.  Patient to follow-up with general surgery.  Return precautions given for any focal abdominal tenderness, intractable nausea vomiting, fever chills with symptoms, or hematemesis or hematochezia.  Patient has previously been on an acid therapy which she reports is not working for her.  I suggested we try 1 week of sucralfate. Patient is in understanding agrees the plan of care.       Delia ChimesMurray, Alyssa B, PA-C 02/04/17 2326    Benjiman CorePickering, Nathan, MD 02/05/17 [phone]

## 2017-02-04 NOTE — ED Notes (Signed)
Pt. Passed PO challenge.

## 2017-02-04 NOTE — ED Provider Notes (Signed)
[facility] Provider Note   CSN: [id number] Arrival date & time: 02/04/17  1652     History   Chief Complaint Chief Complaint  Patient presents with  . Emesis    HPI Tamara [name] is a 21 y.o. female.  HPI   21 year old female presents today with a one week history of nausea vomiting abdominal pain.  Patient notes she has had difficulty tolerating p.o. since the onset of symptoms but was able to tolerate a banana earlier today.  Patient denies any associated fever, reports the abdominal pain is diffuse, worse in the epigastric region.  She denies any significant lower abdominal pain, vaginal bleeding or discharge.  Patient reports normal urine characteristics, reports normal bowel movements with no diarrhea.  Patient notes a distant history of the same when she was a child causing difficulty tolerating p.o.  She notes using antacids at home without significant proven in her symptoms.    Past Medical History:  Diagnosis Date  . ADHD   . Autism   . Personality disorder (HCC)   . PTSD (post-traumatic stress disorder)     Patient Active Problem List   Diagnosis Date Noted  . Borderline personality disorder (HCC) 12/20/2016  . Suicidal ideations 12/20/2016  . Schizoaffective disorder (HCC) 12/19/2016    History reviewed. No pertinent surgical history.  OB History    No data available       Home Medications    Prior to Admission medications   Medication Sig Start Date End Date Taking? Authorizing Provider  amphetamine-dextroamphetamine (ADDERALL) 10 MG tablet Take 10 MG by mouth daily: For ADHD Patient taking differently: Take 20 mg by mouth 2 (two) times daily with a meal.  12/23/16  Yes Nwoko, Agnes I, NP  desvenlafaxine (PRISTIQ) 50 MG 24 hr tablet Take 50 mg by mouth daily. 12/07/16  Yes [provider]  ibuprofen (ADVIL,MOTRIN) 200 MG tablet Take 400 mg by mouth every 6 (six) hours as needed for pain.    Yes [provider]  pantoprazole (PROTONIX) 40 MG tablet Take 1 tablet (40 mg total) by mouth daily. For acid reflux 12/23/16  Yes Armandina StammerNwoko, Agnes I, NP  traZODone (DESYREL) 50 MG tablet Take 1 tablet (50 mg total) by mouth at bedtime as needed for sleep. Patient taking differently: Take 50 mg by mouth at bedtime.  12/23/16  Yes Armandina StammerNwoko, Agnes I, NP  benztropine (COGENTIN) 1 MG tablet Take 1 tablet (1 mg total) by mouth at bedtime. For prevention of drug induced tremors Patient not taking: Reported on 02/04/2017 12/23/16   Armandina StammerNwoko, Agnes I, NP  hydrOXYzine (ATARAX/VISTARIL) 25 MG tablet Take 1 tablet (25 mg total) by mouth every 6 (six) hours as needed for anxiety. Patient not taking: Reported on 02/04/2017 12/23/16   Armandina StammerNwoko, Agnes I, NP  risperiDONE (RISPERDAL) 1 MG tablet Take 1 tablet (1 mg total) by mouth at bedtime. For mood control Patient not taking: Reported on 02/04/2017 12/23/16   Armandina StammerNwoko, Agnes I, NP  venlafaxine XR (EFFEXOR-XR) 150 MG 24 hr capsule Take 1 capsule (150 mg total) by mouth daily with breakfast. For depression Patient not taking: Reported on 02/04/2017 12/24/16   Sanjuana KavaNwoko, Agnes I, NP    Family History Family History  Family history unknown: Yes    Social History Social History   Tobacco Use  . Smoking status: Never Smoker  . Smokeless tobacco: Never Used  Substance Use Topics  . Alcohol use: No    Alcohol/week: 0.0 oz  .  Drug use: No     Allergies   Asa [aspirin] and Lithium   Review of Systems Review of Systems  All other systems reviewed and are negative.    Physical Exam Updated Vital Signs BP 107/69 (BP Location: Right Arm)   Pulse (!) 102   Temp 97.6 F (36.4 C) (Oral)   Resp 15   Ht 5\' 1"  (1.549 m)   Wt 111.6 kg (246 lb)   SpO2 99%   BMI 46.48 kg/m   Physical Exam  Constitutional: She is oriented to person, place, and time. She appears well-developed and well-nourished.  HENT:  Head: Normocephalic and atraumatic.  Eyes:  Conjunctivae are normal. Pupils are equal, round, and reactive to light. Right eye exhibits no discharge. Left eye exhibits no discharge. No scleral icterus.  Neck: Normal range of motion. No JVD present. No tracheal deviation present.  Pulmonary/Chest: Effort normal. No stridor.  Abdominal: She exhibits no distension and no mass. There is tenderness. There is no rebound and no guarding.  Diffuse abdominal tenderness worse in the epigastric region  Neurological: She is alert and oriented to person, place, and time. Coordination normal.  Skin: Skin is warm.  Psychiatric: She has a normal mood and affect. Her behavior is normal. Judgment and thought content normal.  Nursing note and vitals reviewed.    ED Treatments / Results  Labs (all labs ordered are listed, but only abnormal results are displayed) Labs Reviewed  COMPREHENSIVE METABOLIC PANEL - Abnormal; Notable for the following components:      Result Value   CO2 21 (*)    Total Protein 8.2 (*)    Alkaline Phosphatase 136 (*)    All other components within normal limits  CBC - Abnormal; Notable for the following components:   WBC 15.7 (*)    Hemoglobin 15.3 (*)    All other components within normal limits  URINALYSIS, ROUTINE W REFLEX MICROSCOPIC - Abnormal; Notable for the following components:   APPearance HAZY (*)    All other components within normal limits  LIPASE, BLOOD  I-STAT BETA HCG BLOOD, ED ([facility], [facility], [facility] ONLY)    EKG  EKG Interpretation None       Radiology No results found.  Procedures Procedures (including critical care time)  Medications Ordered in ED Medications  iopamidol (ISOVUE-300) 61 % injection (not administered)  sodium chloride 0.9 % bolus 1,000 mL (not administered)  ondansetron (ZOFRAN) injection 4 mg (not administered)     Initial Impression / Assessment and Plan / ED Course  I have reviewed the triage vital signs and the nursing notes.  Pertinent labs & imaging results that were  available during my care of the patient were reviewed by me and considered in my medical decision making (see chart for details).      Final Clinical Impressions(s) / ED Diagnoses   Final diagnoses:  Abdominal pain, unspecified abdominal location    Labs: I-STAT beta-hCG, lipase, CMP, CBC  Imaging:  Consults:  Therapeutics:  Discharge Meds:   Assessment/Plan: 21 year old female presents today with abdominal pain nausea and vomiting. She has diffuse tenderness, worse in the epigastric region.  Patient has slight elevation in her WBCs, CT scan will be ordered.  On going care will be managed by oncoming provider pending CT results.      ED Discharge Orders    None       Rosalio LoudHedges, Shantavia Jha, PA-C 02/04/17 2056    Pricilla LovelessGoldston, Scott, MD 02/04/17 [phone]

## 2017-02-05 ENCOUNTER — Encounter (HOSPITAL_COMMUNITY): Payer: Self-pay

## 2017-02-05 ENCOUNTER — Other Ambulatory Visit: Payer: Self-pay

## 2017-02-05 DIAGNOSIS — Z79899 Other long term (current) drug therapy: Secondary | ICD-10-CM | POA: Insufficient documentation

## 2017-02-05 DIAGNOSIS — K8080 Other cholelithiasis without obstruction: Secondary | ICD-10-CM | POA: Insufficient documentation

## 2017-02-05 DIAGNOSIS — F840 Autistic disorder: Secondary | ICD-10-CM | POA: Diagnosis not present

## 2017-02-05 DIAGNOSIS — F909 Attention-deficit hyperactivity disorder, unspecified type: Secondary | ICD-10-CM | POA: Insufficient documentation

## 2017-02-05 DIAGNOSIS — R1011 Right upper quadrant pain: Secondary | ICD-10-CM | POA: Diagnosis present

## 2017-02-05 LAB — COMPREHENSIVE METABOLIC PANEL
ALBUMIN: 3.9 g/dL (ref 3.5–5.0)
ALK PHOS: 130 U/L — AB (ref 38–126)
ALT: 17 U/L (ref 14–54)
AST: 21 U/L (ref 15–41)
Anion gap: 10 (ref 5–15)
BILIRUBIN TOTAL: 0.7 mg/dL (ref 0.3–1.2)
BUN: 10 mg/dL (ref 6–20)
CALCIUM: 8.9 mg/dL (ref 8.9–10.3)
CO2: 21 mmol/L — ABNORMAL LOW (ref 22–32)
Chloride: 109 mmol/L (ref 101–111)
Creatinine, Ser: 0.73 mg/dL (ref 0.44–1.00)
GFR calc Af Amer: >60 mL/min (ref >60)
GLUCOSE: 104 mg/dL — AB (ref 65–99)
Potassium: 3.8 mmol/L (ref 3.5–5.1)
Sodium: 140 mmol/L (ref 135–145)
TOTAL PROTEIN: 7.6 g/dL (ref 6.5–8.1)

## 2017-02-05 LAB — I-STAT BETA HCG BLOOD, ED (MC, WL, AP ONLY)

## 2017-02-05 LAB — CBC
HCT: 43.4 % (ref 36.0–46.0)
Hemoglobin: 14.3 g/dL (ref 12.0–15.0)
MCH: 29.7 pg (ref 26.0–34.0)
MCHC: 32.9 g/dL (ref 30.0–36.0)
MCV: 90 fL (ref 78.0–100.0)
PLATELETS: 363 10*3/uL (ref 150–400)
RBC: 4.82 MIL/uL (ref 3.87–5.11)
RDW: 12.9 % (ref 11.5–15.5)
WBC: 14.2 10*3/uL — ABNORMAL HIGH (ref 4.0–10.5)

## 2017-02-05 LAB — LIPASE, BLOOD: Lipase: 29 U/L (ref 11–51)

## 2017-02-05 MED ORDER — ONDANSETRON 4 MG PO TBDP
4.0000 mg | ORAL_TABLET | Freq: Once | ORAL | Status: AC | PRN
Start: 2017-02-05 — End: 2017-02-06
  Administered 2017-02-06: 4 mg via ORAL
  Filled 2017-02-05: qty 1

## 2017-02-05 NOTE — ED Triage Notes (Signed)
Patient BIB EMS from home. [facility] and was diagnosed with [facility]. Patient was discharged and told to follow up with a physician, but patient states she hasnt been able to call her surgeon "because she keeps passing out from the pain." Patient requesting pain medication "to get me to my surgeon appointment." Patient alert, oriented, and talking on the phone in triage.

## 2017-02-06 ENCOUNTER — Emergency Department (HOSPITAL_COMMUNITY): Payer: Medicaid Other

## 2017-02-06 ENCOUNTER — Emergency Department (HOSPITAL_COMMUNITY)
Admission: EM | Admit: 2017-02-06 | Discharge: 2017-02-06 | Disposition: A | Discharge status: Home / Self Care | Payer: Medicaid Other | Attending: Emergency Medicine | Admitting: Emergency Medicine

## 2017-02-06 DIAGNOSIS — K8080 Other cholelithiasis without obstruction: Secondary | ICD-10-CM

## 2017-02-06 LAB — URINALYSIS, ROUTINE W REFLEX MICROSCOPIC
Bilirubin Urine: NEGATIVE
Glucose, UA: NEGATIVE mg/dL
Hgb urine dipstick: NEGATIVE
Ketones, ur: NEGATIVE mg/dL
LEUKOCYTES UA: NEGATIVE
NITRITE: NEGATIVE
PROTEIN: NEGATIVE mg/dL
Specific Gravity, Urine: 1.027 (ref 1.005–1.030)
pH: 6 (ref 5.0–8.0)

## 2017-02-06 MED ORDER — HYDROCODONE-ACETAMINOPHEN 5-325 MG PO TABS: 1.0000 | ORAL_TABLET | Freq: Four times a day (QID) | ORAL | 0 refills | Status: DC | PRN

## 2017-02-06 MED ORDER — HYDROCODONE-ACETAMINOPHEN 5-325 MG PO TABS
2.0000 | ORAL_TABLET | Freq: Once | ORAL | Status: AC
  Administered 2017-02-06: 2 via ORAL
  Filled 2017-02-06: qty 2

## 2017-02-06 MED ORDER — MORPHINE SULFATE (PF) 4 MG/ML IV SOLN
4.0000 mg | Freq: Once | INTRAVENOUS | Status: DC
  Filled 2017-02-06: qty 1

## 2017-02-06 MED ORDER — ONDANSETRON HCL 4 MG/2ML IJ SOLN
4.0000 mg | Freq: Once | INTRAMUSCULAR | Status: DC
  Filled 2017-02-06: qty 2

## 2017-02-06 NOTE — ED Provider Notes (Signed)
[facility]-EMERGENCY DEPT Provider Note   CSN: [id number] Arrival date & time: 02/05/17  2046     History   Chief Complaint Chief Complaint  Patient presents with  . Abdominal Pain    HPI Montez MoritaQueenie Rhea Roderick is a 21 y.o. female.  Patient presents to the emergency department with chief complaint of right upper abdominal pain.  She states that the symptoms started a couple of days ago and have gradually worsened.  She reports being seen at the hospital [facility] yesterday, and was diagnosed with gallstones.  She states that she was instructed to follow-up with the general surgeon, but states she has been unable to do this yet.  She also reports that she was unable to get any pain medication.  She reports worsening pain today.  She states pain is worsened after eating.  She denies any fevers, chills, nausea, or vomiting.  She reports that the pain radiates to her back and into her chest.   The history is provided by the patient. No language interpreter was used.    Past Medical History:  Diagnosis Date  . ADHD   . Autism   . Personality disorder (HCC)   . PTSD (post-traumatic stress disorder)     Patient Active Problem List   Diagnosis Date Noted  . Borderline personality disorder (HCC) 12/20/2016  . Suicidal ideations 12/20/2016  . Schizoaffective disorder (HCC) 12/19/2016    History reviewed. No pertinent surgical history.  OB History    No data available       Home Medications    Prior to Admission medications   Medication Sig Start Date End Date Taking? Authorizing Provider  amphetamine-dextroamphetamine (ADDERALL) 10 MG tablet Take 10 MG by mouth daily: For ADHD Patient taking differently: Take 20 mg by mouth 2 (two) times daily with a meal.  12/23/16   Nwoko, Nicole KindredAgnes I, NP  benztropine (COGENTIN) 1 MG tablet Take 1 tablet (1 mg total) by mouth at bedtime. For prevention of drug induced tremors Patient not taking: Reported on 02/04/2017  12/23/16   Armandina StammerNwoko, Agnes I, NP  desvenlafaxine (PRISTIQ) 50 MG 24 hr tablet Take 50 mg by mouth daily. 12/07/16   [provider]  hydrOXYzine (ATARAX/VISTARIL) 25 MG tablet Take 1 tablet (25 mg total) by mouth every 6 (six) hours as needed for anxiety. Patient not taking: Reported on 02/04/2017 12/23/16   Armandina StammerNwoko, Agnes I, NP  ibuprofen (ADVIL,MOTRIN) 200 MG tablet Take 400 mg by mouth every 6 (six) hours as needed for pain.    [provider]  pantoprazole (PROTONIX) 40 MG tablet Take 1 tablet (40 mg total) by mouth daily. For acid reflux 12/23/16   Armandina StammerNwoko, Agnes I, NP  risperiDONE (RISPERDAL) 1 MG tablet Take 1 tablet (1 mg total) by mouth at bedtime. For mood control Patient not taking: Reported on 02/04/2017 12/23/16   Armandina StammerNwoko, Agnes I, NP  sucralfate (CARAFATE) 1 g tablet Take 1 tablet (1 g total) by mouth 4 (four) times daily -  with meals and at bedtime for 7 days. 02/04/17 02/11/17  Aviva KluverMurray, Alyssa B, PA-C  traZODone (DESYREL) 50 MG tablet Take 1 tablet (50 mg total) by mouth at bedtime as needed for sleep. Patient taking differently: Take 50 mg by mouth at bedtime.  12/23/16   Armandina StammerNwoko, Agnes I, NP  venlafaxine XR (EFFEXOR-XR) 150 MG 24 hr capsule Take 1 capsule (150 mg total) by mouth daily with breakfast. For depression Patient not taking: Reported on 02/04/2017 12/24/16  Sanjuana Kava, NP    Family History Family History  Family history unknown: Yes    Social History Social History   Tobacco Use  . Smoking status: Never Smoker  . Smokeless tobacco: Never Used  Substance Use Topics  . Alcohol use: No    Alcohol/week: 0.0 oz  . Drug use: No     Allergies   Asa [aspirin] and Lithium   Review of Systems Review of Systems  All other systems reviewed and are negative.    Physical Exam Updated Vital Signs BP 122/66 (BP Location: Right Arm)   Pulse 83   Temp 98.4 F (36.9 C) (Oral)   Resp 18   Ht 5\' 1"  (1.549 m)   Wt 111.6 kg (246 lb)   SpO2 100%    BMI 46.48 kg/m   Physical Exam  Constitutional: She is oriented to person, place, and time. She appears well-developed and well-nourished.  HENT:  Head: Normocephalic and atraumatic.  Eyes: Conjunctivae and EOM are normal. Pupils are equal, round, and reactive to light.  Neck: Normal range of motion. Neck supple.  Cardiovascular: Normal rate and regular rhythm. Exam reveals no gallop and no friction rub.  No murmur heard. Pulmonary/Chest: Effort normal and breath sounds normal. No respiratory distress. She has no wheezes. She has no rales. She exhibits no tenderness.  Abdominal: Soft. Bowel sounds are normal. She exhibits no distension and no mass. There is tenderness. There is no rebound and no guarding.  Tenderness palpation in the right upper quadrant  Musculoskeletal: Normal range of motion. She exhibits no edema or tenderness.  Neurological: She is alert and oriented to person, place, and time.  Skin: Skin is warm and dry.  Psychiatric: She has a normal mood and affect. Her behavior is normal. Judgment and thought content normal.  Nursing note and vitals reviewed.    ED Treatments / Results  Labs (all labs ordered are listed, but only abnormal results are displayed) Labs Reviewed  COMPREHENSIVE METABOLIC PANEL - Abnormal; Notable for the following components:      Result Value   CO2 21 (*)    Glucose, Bld 104 (*)    Alkaline Phosphatase 130 (*)    All other components within normal limits  CBC - Abnormal; Notable for the following components:   WBC 14.2 (*)    All other components within normal limits  LIPASE, BLOOD  URINALYSIS, ROUTINE W REFLEX MICROSCOPIC  I-STAT BETA HCG BLOOD, ED ([facility], [facility], [facility] ONLY)    EKG  EKG Interpretation None       Radiology Ct Abdomen Pelvis W Contrast  Result Date: 02/04/2017 CLINICAL DATA:  Unspecified abdominal pain. Nausea and vomiting for a week. EXAM: CT ABDOMEN AND PELVIS WITH CONTRAST TECHNIQUE: Multidetector CT imaging of the  abdomen and pelvis was performed using the standard protocol following bolus administration of intravenous contrast. CONTRAST:  ISOVUE-300 IOPAMIDOL (ISOVUE-300) INJECTION 61% COMPARISON:  None. FINDINGS: Lower chest:  No acute finding Hepatobiliary: Nonspecific coarse calcification in the central liver.Cholelithiasis. No evidence of cholecystitis. Pancreas: Motion artifact especially along the ventral pancreas. No inflammation or suspected mass. Spleen: Unremarkable. Adrenals/Urinary Tract: Negative adrenals. No hydronephrosis or stone. Unremarkable bladder. Stomach/Bowel:  No obstruction. No appendicitis. Vascular/Lymphatic: No acute vascular abnormality. No mass or adenopathy. Reproductive:No pathologic findings. Other: No ascites or pneumoperitoneum. Musculoskeletal: No acute abnormalities. IMPRESSION: 1. No acute finding. 2. Cholelithiasis without changes of cholecystitis. 3. Motion degraded to a degree that findings could be obscured. Electronically Signed  By: Marnee SpringJonathon  Watts M.D.   On: 02/04/2017 21:52    Procedures Procedures (including critical care time)  Medications Ordered in ED Medications  ondansetron (ZOFRAN-ODT) disintegrating tablet 4 mg (not administered)  morphine 4 MG/ML injection 4 mg (not administered)  ondansetron (ZOFRAN) injection 4 mg (not administered)     Initial Impression / Assessment and Plan / ED Course  I have reviewed the triage vital signs and the nursing notes.  Pertinent labs & imaging results that were available during my care of the patient were reviewed by me and considered in my medical decision making (see chart for details).    Patient with right upper abdominal pain.  She has known gallstones.  She reports worsening pain today.  Will check right upper quadrant ultrasound.  Patient is afebrile.  She is not vomiting.  She has no significant elevations of her LFTs.  Lipase is normal.  Will treat pain, obtain ultrasound, and will  reassess.  Ultrasound is consistent with cholelithiasis without cholecystitis.  We will discharge home with pain medication, and recommend outpatient follow-up with general surgery.  Patient understands and agrees the plan.  She is stable and ready for discharge.  Final Clinical Impressions(s) / ED Diagnoses   Final diagnoses:  Biliary calculus of other site without obstruction    ED Discharge Orders        Ordered    HYDROcodone-acetaminophen (NORCO/VICODIN) 5-325 MG tablet  Every 6 hours PRN     02/06/17 0310       Roxy HorsemanBrowning, Elbert Polyakov, PA-C 02/06/17 0319    Devoria AlbeKnapp, Iva, MD 02/06/17 [phone]

## 2017-02-06 NOTE — ED Notes (Signed)
Pt states she cannot give urine sample at this time.

## 2017-02-06 NOTE — ED Notes (Signed)
Pt. Requested PO pain meds instead of IV as ordered. Will notify MD.

## 2017-03-22 ENCOUNTER — Observation Stay (HOSPITAL_COMMUNITY)
Admission: AD | Admit: 2017-03-22 | Discharge: 2017-03-24 | Disposition: A | Discharge status: Home / Self Care | Payer: Medicaid Other | Source: Ambulatory Visit | Attending: Cardiovascular Disease | Admitting: Cardiovascular Disease

## 2017-03-22 ENCOUNTER — Encounter (HOSPITAL_COMMUNITY): Payer: Self-pay | Admitting: General Practice

## 2017-03-22 ENCOUNTER — Other Ambulatory Visit: Payer: Self-pay

## 2017-03-22 DIAGNOSIS — F603 Borderline personality disorder: Secondary | ICD-10-CM | POA: Insufficient documentation

## 2017-03-22 DIAGNOSIS — F909 Attention-deficit hyperactivity disorder, unspecified type: Secondary | ICD-10-CM | POA: Diagnosis not present

## 2017-03-22 DIAGNOSIS — F259 Schizoaffective disorder, unspecified: Principal | ICD-10-CM | POA: Insufficient documentation

## 2017-03-22 DIAGNOSIS — Z886 Allergy status to analgesic agent status: Secondary | ICD-10-CM | POA: Insufficient documentation

## 2017-03-22 DIAGNOSIS — R112 Nausea with vomiting, unspecified: Secondary | ICD-10-CM | POA: Diagnosis not present

## 2017-03-22 DIAGNOSIS — F4310 Post-traumatic stress disorder, unspecified: Secondary | ICD-10-CM | POA: Insufficient documentation

## 2017-03-22 DIAGNOSIS — F840 Autistic disorder: Secondary | ICD-10-CM | POA: Insufficient documentation

## 2017-03-22 DIAGNOSIS — Z79899 Other long term (current) drug therapy: Secondary | ICD-10-CM | POA: Diagnosis not present

## 2017-03-22 HISTORY — DX: Depression, unspecified: F32.A

## 2017-03-22 HISTORY — DX: Gastro-esophageal reflux disease without esophagitis: K21.9

## 2017-03-22 HISTORY — DX: Headache, unspecified: R51.9

## 2017-03-22 HISTORY — DX: Chronic gastric ulcer without hemorrhage or perforation: K25.7

## 2017-03-22 HISTORY — DX: Major depressive disorder, single episode, unspecified: F32.9

## 2017-03-22 HISTORY — DX: Dorsalgia, unspecified: M54.9

## 2017-03-22 HISTORY — DX: Headache: R51

## 2017-03-22 HISTORY — DX: Bipolar disorder, unspecified: F31.9

## 2017-03-22 HISTORY — DX: Other chronic pain: G89.29

## 2017-03-22 HISTORY — DX: Migraine, unspecified, not intractable, without status migrainosus: G43.909

## 2017-03-22 HISTORY — DX: Unspecified convulsions: R56.9

## 2017-03-22 LAB — CBC WITH DIFFERENTIAL/PLATELET
Basophils Absolute: 0.1 10*3/uL (ref 0.0–0.1)
Basophils Relative: 0 %
EOS ABS: 0.4 10*3/uL (ref 0.0–0.7)
Eosinophils Relative: 2 %
HCT: 44.1 % (ref 36.0–46.0)
Hemoglobin: 14.3 g/dL (ref 12.0–15.0)
LYMPHS ABS: 3.7 10*3/uL (ref 0.7–4.0)
LYMPHS PCT: 21 %
MCH: 28.9 pg (ref 26.0–34.0)
MCHC: 32.4 g/dL (ref 30.0–36.0)
MCV: 89.3 fL (ref 78.0–100.0)
MONOS PCT: 6 %
Monocytes Absolute: 1.1 10*3/uL — ABNORMAL HIGH (ref 0.1–1.0)
Neutro Abs: 12.3 10*3/uL — ABNORMAL HIGH (ref 1.7–7.7)
Neutrophils Relative %: 71 %
Platelets: 316 10*3/uL (ref 150–400)
RBC: 4.94 MIL/uL (ref 3.87–5.11)
RDW: 13.3 % (ref 11.5–15.5)
WBC: 17.5 10*3/uL — AB (ref 4.0–10.5)

## 2017-03-22 LAB — AMYLASE: Amylase: 75 U/L (ref 28–100)

## 2017-03-22 LAB — COMPREHENSIVE METABOLIC PANEL
ALK PHOS: 109 U/L (ref 38–126)
ALT: 18 U/L (ref 14–54)
AST: 18 U/L (ref 15–41)
Albumin: 3.5 g/dL (ref 3.5–5.0)
Anion gap: 9 (ref 5–15)
BUN: 7 mg/dL (ref 6–20)
CALCIUM: 9.1 mg/dL (ref 8.9–10.3)
CO2: 23 mmol/L (ref 22–32)
CREATININE: 0.74 mg/dL (ref 0.44–1.00)
Chloride: 107 mmol/L (ref 101–111)
Glucose, Bld: 106 mg/dL — ABNORMAL HIGH (ref 65–99)
Potassium: 3.3 mmol/L — ABNORMAL LOW (ref 3.5–5.1)
SODIUM: 139 mmol/L (ref 135–145)
TOTAL PROTEIN: 7 g/dL (ref 6.5–8.1)
Total Bilirubin: 0.9 mg/dL (ref 0.3–1.2)

## 2017-03-22 LAB — LIPASE, BLOOD: LIPASE: 28 U/L (ref 11–51)

## 2017-03-22 MED ORDER — BOOST / RESOURCE BREEZE PO LIQD CUSTOM
1.0000 | Freq: Three times a day (TID) | ORAL | Status: DC
  Administered 2017-03-22 – 2017-03-24 (×5): 1 via ORAL

## 2017-03-22 MED ORDER — PROMETHAZINE HCL 25 MG/ML IJ SOLN: 12.5000 mg | INTRAMUSCULAR | Status: DC | PRN

## 2017-03-22 MED ORDER — SODIUM CHLORIDE 0.9 % IV SOLN
INTRAVENOUS | Status: DC
  Administered 2017-03-22: 23:00:00 via INTRAVENOUS

## 2017-03-22 MED ORDER — HEPARIN SODIUM (PORCINE) 5000 UNIT/ML IJ SOLN
5000.0000 [IU] | Freq: Three times a day (TID) | INTRAMUSCULAR | Status: DC
  Filled 2017-03-22 (×2): qty 1

## 2017-03-22 NOTE — H&P (Signed)
Referring Physician:  [name] is an 22 y.o. female.                       Chief Complaint: Nausea and vomiting  HPI: 22 year old female with borderline personality disorder has 1 week of nausea and vomiting without fever, chills or urinary symptoms. Patient has progesterone implant and has not had her monthly menstrual periods for few months. She c/o weakness and increased sleep. She has had her flu-shot 1 month ago.  Past Medical History:  Diagnosis Date  . ADHD   . Autism   . Personality disorder (HCC)   . PTSD (post-traumatic stress disorder)       No past surgical history on file.  Family History  Family history unknown: Yes   Social History:  reports that  has never smoked. she has never used smokeless tobacco. She reports that she does not drink alcohol or use drugs.  Allergies:  Allergies  Allergen Reactions  . Asa [Aspirin]     "PASSED OUT"  . Lithium Hives    Medications Prior to Admission  Medication Sig Dispense Refill  . amphetamine-dextroamphetamine (ADDERALL) 10 MG tablet Take 10 MG by mouth daily: For ADHD (Patient taking differently: Take 20 mg by mouth 2 (two) times daily with a meal. )    . benztropine (COGENTIN) 1 MG tablet Take 1 tablet (1 mg total) by mouth at bedtime. For prevention of drug induced tremors (Patient not taking: Reported on 02/04/2017) 30 tablet 0  . desvenlafaxine (PRISTIQ) 50 MG 24 hr tablet Take 50 mg by mouth daily.  0  . HYDROcodone-acetaminophen (NORCO/VICODIN) 5-325 MG tablet Take 1-2 tablets by mouth every 6 (six) hours as needed. 10 tablet 0  . hydrOXYzine (ATARAX/VISTARIL) 25 MG tablet Take 1 tablet (25 mg total) by mouth every 6 (six) hours as needed for anxiety. (Patient not taking: Reported on 02/04/2017) 60 tablet 0  . ibuprofen (ADVIL,MOTRIN) 200 MG tablet Take 400 mg by mouth every 6 (six) hours as needed for pain.    . pantoprazole (PROTONIX) 40 MG tablet Take 1 tablet (40 mg total) by mouth daily. For acid  reflux    . risperiDONE (RISPERDAL) 1 MG tablet Take 1 tablet (1 mg total) by mouth at bedtime. For mood control (Patient not taking: Reported on 02/04/2017) 30 tablet 0  . sucralfate (CARAFATE) 1 g tablet Take 1 tablet (1 g total) by mouth 4 (four) times daily -  with meals and at bedtime for 7 days. 28 tablet 0  . traZODone (DESYREL) 50 MG tablet Take 1 tablet (50 mg total) by mouth at bedtime as needed for sleep. (Patient taking differently: Take 50 mg by mouth at bedtime. ) 30 tablet 0  . venlafaxine XR (EFFEXOR-XR) 150 MG 24 hr capsule Take 1 capsule (150 mg total) by mouth daily with breakfast. For depression (Patient not taking: Reported on 02/04/2017) 30 capsule 0    No results found for this or any previous visit (from the past 48 hour(s)). No results found.  Review Of Systems Constitutional: No fever, chills, weight loss or gain. Eyes: No vision change, wears glasses. No discharge or pain. Ears: No hearing loss, No tinnitus. Respiratory: No asthma, COPD, pneumonias. No shortness of breath. No hemoptysis. Cardiovascular: No chest pain, palpitation, leg edema. Gastrointestinal: Positive nausea, vomiting, diarrhea, constipation. No GI bleed. No hepatitis. Genitourinary: No dysuria, hematuria, kidney stone. No incontinance. Neurological: No headache, stroke, seizures.  Psychiatry: Positive psych facility  admission for anxiety, depression, suicide. No detox. Skin: No rash. Musculoskeletal: No joint pain, fibromyalgia. No neck pain, positive back pain. Lymphadenopathy: No lymphadenopathy. Hematology: No anemia or easy bruising.   There were no vitals taken for this visit. There is no height or weight on file to calculate BMI. General appearance: alert, cooperative, appears stated age and no distress Head: Normocephalic, atraumatic. Eyes: Brown eyes, pink conjunctiva, corneas clear. PERRL, EOM's intact. Neck: No adenopathy, no carotid bruit, no JVD, supple, symmetrical, trachea  midline and thyroid not enlarged. Resp: Clear to auscultation bilaterally. Cardio: Regular rate and rhythm, S1, S2 normal, II/VI systolic murmur, no click, rub or gallop GI: Soft, epigastric area-tender; bowel sounds normal; no organomegaly. Extremities: No edema, cyanosis or clubbing. Skin: Warm and dry.  Neurologic: Alert and oriented X 3, normal strength. Normal coordination and gait.  Assessment/Plan Nausea and vomiting R/O bowel obstruction R/O pregnancy Schizoaffective disorder  Place in observation X-ray abdomen and blood work IV fluids.  Tamara RodriguezAjay S Rhodia [place], MD  03/22/2017, 1:35 PM

## 2017-03-22 NOTE — Progress Notes (Signed)
Patient arrived to [phone] as a direct admit.  No family at bedside.  VSS and skin intact.  Patient oriented to room and equipment.  Will continue to monitor.

## 2017-03-23 ENCOUNTER — Observation Stay (HOSPITAL_COMMUNITY): Payer: Medicaid Other

## 2017-03-23 DIAGNOSIS — F259 Schizoaffective disorder, unspecified: Secondary | ICD-10-CM | POA: Diagnosis not present

## 2017-03-23 LAB — URINALYSIS, ROUTINE W REFLEX MICROSCOPIC
Bacteria, UA: NONE SEEN
Bilirubin Urine: NEGATIVE
GLUCOSE, UA: NEGATIVE mg/dL
Hgb urine dipstick: NEGATIVE
Ketones, ur: NEGATIVE mg/dL
NITRITE: NEGATIVE
PH: 5 (ref 5.0–8.0)
PROTEIN: NEGATIVE mg/dL
Specific Gravity, Urine: 1.026 (ref 1.005–1.030)

## 2017-03-23 LAB — CBC
HCT: 40.8 % (ref 36.0–46.0)
HEMOGLOBIN: 13.6 g/dL (ref 12.0–15.0)
MCH: 29.5 pg (ref 26.0–34.0)
MCHC: 33.3 g/dL (ref 30.0–36.0)
MCV: 88.5 fL (ref 78.0–100.0)
Platelets: 335 10*3/uL (ref 150–400)
RBC: 4.61 MIL/uL (ref 3.87–5.11)
RDW: 13.4 % (ref 11.5–15.5)
WBC: 11.3 10*3/uL — AB (ref 4.0–10.5)

## 2017-03-23 LAB — BASIC METABOLIC PANEL
ANION GAP: 8 (ref 5–15)
BUN: 7 mg/dL (ref 6–20)
CHLORIDE: 107 mmol/L (ref 101–111)
CO2: 23 mmol/L (ref 22–32)
Calcium: 8.9 mg/dL (ref 8.9–10.3)
Creatinine, Ser: 0.71 mg/dL (ref 0.44–1.00)
GFR calc Af Amer: >60 mL/min (ref >60)
Glucose, Bld: 98 mg/dL (ref 65–99)
Potassium: 3.6 mmol/L (ref 3.5–5.1)
SODIUM: 138 mmol/L (ref 135–145)

## 2017-03-23 LAB — HIV ANTIBODY (ROUTINE TESTING W REFLEX): HIV SCREEN 4TH GENERATION: NONREACTIVE

## 2017-03-23 LAB — PREGNANCY, URINE: Preg Test, Ur: NEGATIVE

## 2017-03-23 MED ORDER — POTASSIUM CHLORIDE IN NACL 20-0.9 MEQ/L-% IV SOLN: INTRAVENOUS | Status: DC

## 2017-03-23 MED ORDER — POTASSIUM CHLORIDE CRYS ER 10 MEQ PO TBCR
10.0000 meq | EXTENDED_RELEASE_TABLET | Freq: Two times a day (BID) | ORAL | Status: DC
  Administered 2017-03-23 – 2017-03-24 (×3): 10 meq via ORAL
  Filled 2017-03-23 (×3): qty 1

## 2017-03-23 MED ORDER — PROMETHAZINE HCL 25 MG PO TABS
25.0000 mg | ORAL_TABLET | Freq: Four times a day (QID) | ORAL | Status: DC | PRN
  Administered 2017-03-23 (×2): 25 mg via ORAL
  Filled 2017-03-23 (×2): qty 1

## 2017-03-23 MED ORDER — ALUM & MAG HYDROXIDE-SIMETH 200-200-20 MG/5ML PO SUSP
15.0000 mL | ORAL | Status: DC | PRN
  Administered 2017-03-23: 15 mL via ORAL
  Filled 2017-03-23: qty 30

## 2017-03-23 NOTE — Progress Notes (Signed)
Ref: Orpah CobbKadakia, Ruwayda Curet, MD   Subjective:  Less nausea. X-ray abdomen was unremarkable. Normal amylase and lipase.  Objective:  Vital Signs in the last 24 hours: Temp:  [98 F (36.7 C)-98.3 F (36.8 C)] 98.3 F (36.8 C) (01/15 2100) Pulse Rate:  [82-87] 84 (01/15 2100) Resp:  [17-18] 17 (01/15 2100) BP: (112-115)/(53-63) 115/63 (01/15 2100) SpO2:  [100 %] 100 % (01/15 2100)  Physical Exam: BP Readings from Last 1 Encounters:  03/23/17 115/63     Wt Readings from Last 1 Encounters:  03/22/17 108.5 kg (239 lb 4.8 oz)    Weight change:  Body mass index is 45.22 kg/m. HEENT: [place]/AT, Eyes-Brown, PERL, EOMI, Conjunctiva-Pink, Sclera-Non-icteric Neck: No JVD, No bruit, Trachea midline. Lungs:  Clear, Bilateral. Cardiac:  Regular rhythm, normal S1 and S2, no S3. II/VI systolic murmur. Abdomen:  Soft, epigastric-tender. BS present. Extremities:  No edema present. No cyanosis. No clubbing. CNS: AxOx3, Cranial nerves grossly intact, moves all 4 extremities.  Skin: Warm and dry.   Intake/Output from previous day: No intake/output data recorded.    Lab Results: BMET    Component Value Date/Time   NA 138 03/23/2017 0800   NA 139 03/22/2017 2202   NA 140 02/05/2017 2056   K 3.6 03/23/2017 0800   K 3.3 (L) 03/22/2017 2202   K 3.8 02/05/2017 2056   CL 107 03/23/2017 0800   CL 107 03/22/2017 2202   CL 109 02/05/2017 2056   CO2 23 03/23/2017 0800   CO2 23 03/22/2017 2202   CO2 21 (L) 02/05/2017 2056   GLUCOSE 98 03/23/2017 0800   GLUCOSE 106 (H) 03/22/2017 2202   GLUCOSE 104 (H) 02/05/2017 2056   BUN 7 03/23/2017 0800   BUN 7 03/22/2017 2202   BUN 10 02/05/2017 2056   CREATININE 0.71 03/23/2017 0800   CREATININE 0.74 03/22/2017 2202   CREATININE 0.73 02/05/2017 2056   CALCIUM 8.9 03/23/2017 0800   CALCIUM 9.1 03/22/2017 2202   CALCIUM 8.9 02/05/2017 2056   GFRNONAA >60 03/23/2017 0800   GFRNONAA >60 03/22/2017 2202   GFRNONAA >60 02/05/2017 2056   GFRAA >60 03/23/2017  0800   GFRAA >60 03/22/2017 2202   GFRAA >60 02/05/2017 2056   CBC    Component Value Date/Time   WBC 11.3 (H) 03/23/2017 0800   RBC 4.61 03/23/2017 0800   HGB 13.6 03/23/2017 0800   HCT 40.8 03/23/2017 0800   PLT 335 03/23/2017 0800   MCV 88.5 03/23/2017 0800   MCH 29.5 03/23/2017 0800   MCHC 33.3 03/23/2017 0800   RDW 13.4 03/23/2017 0800   LYMPHSABS 3.7 03/22/2017 2202   MONOABS 1.1 (H) 03/22/2017 2202   EOSABS 0.4 03/22/2017 2202   BASOSABS 0.1 03/22/2017 2202   HEPATIC Function Panel Recent Labs    02/04/17 1713 02/05/17 2056 03/22/17 2202  PROT 8.2* 7.6 7.0   HEMOGLOBIN A1C No components found for: HGA1C,  MPG CARDIAC ENZYMES No results found for: CKTOTAL, CKMB, CKMBINDEX, TROPONINI BNP No results for input(s): PROBNP in the last 8760 hours. TSH No results for input(s): TSH in the last 8760 hours. CHOLESTEROL No results for input(s): CHOL in the last 8760 hours.  Scheduled Meds: . feeding supplement  1 Container Oral TID BM  . heparin  5,000 Units Subcutaneous Q8H  . potassium chloride  10 mEq Oral BID   Continuous Infusions: PRN Meds:.alum & mag hydroxide-simeth, promethazine  Assessment/Plan: Nause and vomiting Bowel obstruction ruled out Pregnancy ruled out Schizoaffective disorder  Advance diet as  tolerated.   LOS: 0 days    Orpah Cobb  MD  03/23/2017, 10:06 PM

## 2017-03-23 NOTE — Progress Notes (Signed)
Pt I have order to get IV fluid but refuses to have the iv line already inserted to be taken out.Pt declines at this time to have another IV line. I have discussed the risks and benefits of  having iv fluid with her. The patient verbalizes understanding.

## 2017-03-23 NOTE — Social Work (Addendum)
CSW acknowledging consult for transportation needs.  CSW attempted assessment, pt asleep and did not rouse when CSW entered room.    10:30am- CSW able to do quick assessment (see assessment note).  CSW continuing to follow to support pt.   Doy HutchingIsabel H Damisha Wolff, LCSWA [facility] Clinical Social Work [phone]

## 2017-03-23 NOTE — Clinical Social Work Note (Addendum)
Clinical Social Work Assessment  Patient Details  Name: Montez MoritaQueenie Rhea Rocha MRN: [id number] Date of Birth: 1996-01-09  Date of referral:  03/23/17               Reason for consult:                   Permission sought to share information with:  Other Permission granted to share information::  Yes, Verbal Permission Granted  Name::     Roseanne Kaufmanllie Destefano  Agency::     Relationship::  significant other  Contact Information:  [phone]  Housing/Transportation Living arrangements for the past 2 months:  Apartment Source of Information:  Patient Patient Interpreter Needed:  None Criminal Activity/Legal Involvement Pertinent to Current Situation/Hospitalization:  No - Comment as needed Significant Relationships:  Significant Other, Friend Lives with:  Self Do you feel safe going back to the place where you live?  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 Hx of mental health diagnosis and diagnosis of autism.    Social Worker assessment / plan:  CSW spoke with pt at bedside, pt stated she was tired but agreed to speak with CSW. 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 CSW inquired about supports that pt has in the area, pt states she has a girlfriend in [place] that CSW can contact if need be, but states no other supports in the area. CSW touched briefly on transportation needs, pt states she will need help getting back to her apartment when discharged. CSW continuing to follow.   Employment status:  Copytudent Insurance information:  [place] PT Recommendations:  Not assessed at this time Information / Referral to community resources:  Other (Comment Required)(Transportation)  Patient/Family's Response to care: Pt amenable to CSW support and understands CSW role. Pt stated she was tired and turned back to CSW during  assessment, requested CSW come back later.   Patient/Family's Understanding of and Emotional Response to Diagnosis, Current Treatment, and Prognosis:  Pt understands her diagnosis, current treatment, and prognosis. Pt expresses no emotional reaction to CSW questions or diagnosis/treatment/prognosis.   Emotional Assessment Appearance:  Appears stated age Attitude/Demeanor/Rapport:  Self-Absorbed, Sedated Affect (typically observed):  Withdrawn(Sedated; Tired; Self Absorbed) Orientation:  Oriented to Self, Oriented to Place, Oriented to  Time, Oriented to Situation Alcohol / Substance use:    Psych involvement (Current and /or in the community):  No (Comment)  Discharge Needs  Concerns to be addressed:  Discharge Planning Concerns, Other (Comment Required(Transportation) Readmission within the last 30 days:  No Current discharge risk:  Chronically ill, Lack of support system Barriers to Discharge:  Continued Medical Work up(Transportation)   Doy Hutchingsabel H Venus Gilles, LCSWA 03/23/2017, 12:45 PM

## 2017-03-23 NOTE — Progress Notes (Signed)
Pt refused needle doesn't want IVF, refused heparin, no nausea/vomiting noted and wants to eat regular food, Dr. Riki AltesKadakai aware and he ordered regular diet if no s/s of abd pain or nausea/vomiting after dinner she can go home tonight or tomorrow morning.

## 2017-03-24 DIAGNOSIS — F259 Schizoaffective disorder, unspecified: Secondary | ICD-10-CM | POA: Diagnosis not present

## 2017-03-24 LAB — BETA HCG QUANT (REF LAB): hCG Quant: <1 m[IU]/mL

## 2017-03-24 MED ORDER — POTASSIUM CHLORIDE CRYS ER 10 MEQ PO TBCR: 10.0000 meq | EXTENDED_RELEASE_TABLET | Freq: Every day | ORAL | 3 refills | Status: DC

## 2017-03-24 MED ORDER — AMPHETAMINE-DEXTROAMPHETAMINE 10 MG PO TABS: 20.0000 mg | ORAL_TABLET | Freq: Two times a day (BID) | ORAL | Status: DC

## 2017-03-24 MED ORDER — PROMETHAZINE HCL 25 MG PO TABS: 25.0000 mg | ORAL_TABLET | Freq: Every day | ORAL | 0 refills | Status: DC

## 2017-03-24 NOTE — Progress Notes (Signed)
Tamara MoritaQueenie Rhea [name] discharged per MD order. Discussed with the patient and all questions fully answered.  VSS, Skin clean, dry and intact without evidence of skin break down, no evidence of skin tears noted.  An After Visit Summary was printed and given to the patient. Patient received prescription.  Discharge education completed with patient including follow up instructions, medication list, d/c activities limitations if indicated, with other d/c instructions as indicated by MD - patient able to verbalize understanding, all questions fully answered.   Patient instructed to return to ED, call 911, or call MD for any changes in condition.   Patient going to bus stop on discharge.

## 2017-03-24 NOTE — Discharge Summary (Signed)
Physician Discharge Summary  Patient ID: Tamara [name] MRN: [id number] DOB/AGE: 1995/05/11 22 y.o.  Admit date: 03/22/2017 Discharge date: 03/24/2017  Admission Diagnoses: Nausea and vomiting R/O bowel obstruction R/O pregnancy Schizoaffective disorder  Discharge Diagnoses:  Principal Problem:   Nausea & vomiting Active Problems:   Schizoaffective disorder (HCC)   Possible viral gastritis  Discharged Condition: fair  Hospital Course: 22 year old female with boerderline personality disorder had 1 week history of nausea and vomiting. She also had no menstrual periods for few months. She had her flu shot last month. She had increased stress from attending educational classes towards a degree program. She responded well to phenergan use and bowel rest as her work up was negative for bowel obstruction and pregnancy. She refused most of her care involving IV use or injections. She advanced her diet without vomiting and will be followed in 1 week.   Consults: cardiology  Significant Diagnostic Studies: labs: Elevated WBC count, normal Hgb and Platelets count, near normal chemistry with mildly low potassium level corrected with oral supplement. Negative urine pregnancy test, amylase and Lipase.  X-ray chest and abdomen: unremarkable.  Treatments: IV hydration and phenergan, clear liquid diet slowly advanced.  Discharge Exam: Blood pressure 113/66, pulse 83, temperature 98.2 F (36.8 C), temperature source Oral, resp. rate 17, height 5\' 1"  (1.549 m), weight 108.5 kg (239 lb 4.8 oz), SpO2 99 %. General appearance: alert, cooperative and appears stated age. Head: Normocephalic, atraumatic. Eyes: Brown eyes, pink conjunctiva, corneas clear. PERRL, EOM's intact. Wears glasses. Neck: No adenopathy, no carotid bruit, no JVD, supple, symmetrical, trachea midline and thyroid not enlarged. Resp: Clear to auscultation bilaterally. Cardio: Regular rate and rhythm, S1, S2 normal, II/VI  systolic murmur, no click, rub or gallop. GI: Soft, epigastric area-tender; bowel sounds normal; no organomegaly. Extremities: No edema, cyanosis or clubbing. Skin: Warm and dry.  Neurologic: Alert and oriented X 3, normal strength and tone. Normal coordination and gait.  Disposition: 01-Home or Self Care   Allergies as of 03/24/2017      Reactions   Asa [aspirin] Anaphylaxis   Lithium Hives      Medication List    STOP taking these medications   benztropine 1 MG tablet Commonly known as:  COGENTIN   hydrOXYzine 25 MG tablet Commonly known as:  ATARAX/VISTARIL   risperiDONE 1 MG tablet Commonly known as:  RISPERDAL   sucralfate 1 g tablet Commonly known as:  CARAFATE   venlafaxine XR 150 MG 24 hr capsule Commonly known as:  EFFEXOR-XR     TAKE these medications   amphetamine-dextroamphetamine 10 MG tablet Commonly known as:  ADDERALL Take 2 tablets (20 mg total) by mouth 2 (two) times daily with a meal.   desvenlafaxine 100 MG 24 hr tablet Commonly known as:  PRISTIQ Take 100 mg by mouth daily.   lurasidone 40 MG Tabs tablet Commonly known as:  LATUDA Take 40 mg by mouth daily with breakfast.   pantoprazole 40 MG tablet Commonly known as:  PROTONIX Take 1 tablet (40 mg total) by mouth daily. For acid reflux   potassium chloride 10 MEQ tablet Commonly known as:  K-DUR,KLOR-CON Take 1 tablet (10 mEq total) by mouth daily.   prazosin 2 MG capsule Commonly known as:  MINIPRESS Take 2 mg by mouth at bedtime.   promethazine 25 MG tablet Commonly known as:  PHENERGAN Take 1 tablet (25 mg total) by mouth daily.   traZODone 50 MG tablet Commonly known as:  DESYREL Take  1 tablet (50 mg total) by mouth at bedtime as needed for sleep. What changed:    how much to take  when to take this      Follow-up Information    Orpah CobbKadakia, Shaterria Sager, MD. Schedule an appointment as soon as possible for a visit in 1 week(s).   Specialty:  Cardiology Contact information: [address] [place] [address] [phone]           Signed: Ricki Rodriguezjay S Kalana [name] 03/24/2017, 9:31 AM

## 2017-05-18 ENCOUNTER — Encounter: Payer: Self-pay | Admitting: Gastroenterology

## 2017-07-02 ENCOUNTER — Ambulatory Visit: Payer: Self-pay | Admitting: Gastroenterology

## 2017-07-02 ENCOUNTER — Ambulatory Visit: Payer: Self-pay | Admitting: Physician Assistant

## 2017-07-26 ENCOUNTER — Emergency Department (HOSPITAL_COMMUNITY): Payer: Medicaid Other

## 2017-07-26 ENCOUNTER — Encounter (HOSPITAL_COMMUNITY): Payer: Self-pay | Admitting: *Deleted

## 2017-07-26 ENCOUNTER — Emergency Department (HOSPITAL_COMMUNITY)
Admission: EM | Admit: 2017-07-26 | Discharge: 2017-07-26 | Disposition: A | Discharge status: Home / Self Care | Payer: Medicaid Other | Attending: Emergency Medicine | Admitting: Emergency Medicine

## 2017-07-26 DIAGNOSIS — F840 Autistic disorder: Secondary | ICD-10-CM | POA: Insufficient documentation

## 2017-07-26 DIAGNOSIS — F909 Attention-deficit hyperactivity disorder, unspecified type: Secondary | ICD-10-CM | POA: Insufficient documentation

## 2017-07-26 DIAGNOSIS — J069 Acute upper respiratory infection, unspecified: Secondary | ICD-10-CM

## 2017-07-26 DIAGNOSIS — Z79899 Other long term (current) drug therapy: Secondary | ICD-10-CM | POA: Diagnosis not present

## 2017-07-26 DIAGNOSIS — B9789 Other viral agents as the cause of diseases classified elsewhere: Secondary | ICD-10-CM | POA: Insufficient documentation

## 2017-07-26 LAB — POC URINE PREG, ED: PREG TEST UR: NEGATIVE

## 2017-07-26 NOTE — Discharge Instructions (Signed)
You can take Tylenol or Ibuprofen as directed for pain. You can alternate Tylenol and Ibuprofen every 4 hours. If you take Tylenol at 1pm, then you can take Ibuprofen at 5pm. Then you can take Tylenol again at 9pm.   Continue take the cough medication as directed.  Continue taking the medication for nausea/vomiting as directed.  Follow-up with your primary care doctor or the referred [facility] in the next 24 to 48 hours for further evaluation.  Return to the emergency department for any fever, inability to eat or drink anything, persistent vomiting, abdominal pain, chest pain, difficulty breathing or any other worsening or concerning symptoms.

## 2017-07-26 NOTE — ED Notes (Signed)
Pt asked for urine sample.

## 2017-07-26 NOTE — ED Notes (Signed)
Pt given PO fluids and a sandwich

## 2017-07-26 NOTE — ED Provider Notes (Signed)
[facility]-EMERGENCY DEPT Provider Note   CSN: [id number] Arrival date & time: 07/26/17  1419     History   Chief Complaint Chief Complaint  Patient presents with  . URI    HPI Tamara [name] is a 22 y.o. female possible history of autism, bipolar, depression, GERD who presents for evaluation of generalized myalgias, cough, nasal congestion, chills.  Patient reports that this is been ongoing for last week.  She states that she had seen a doctor 4 days ago and had been diagnosed with a viral illness.  Patient states that she was given medication to help with nausea and cough.  Patient called EMS today because she states that she is still having symptoms and states that she should be over it by now.  Patient states she is not getting worse but states that she feels like she is not getting any better either.  Patient reports that she has taken the cough medication with minimal improvement.  She took some over-the-counter cough syrup which she states improved her cough more.  Patient reports cough is productive with phlegm with some blood specks in it.  No gross hemoptysis.  Patient reports intermittent vomiting.  She states she has a few episodes of posttussive emesis but has had some episodes of separate vomiting.  No blood noted in the emesis.  Patient reports she is intermittently able to keep food down without any difficulty.  Patient states that she has not had any fevers.  Patient also reports some right ear pain.  Patient reports diffuse myalgias.  She reports some generalized abdominal pain with no focal point.  Patient reports she has not taken any medications for the symptoms.  Patient denies any chest pain, difficulty breathing, sore throat.  The history is provided by the patient.    Past Medical History:  Diagnosis Date  . ADHD   . Autism   . Bipolar disorder (HCC)   . Chronic back pain    "mostly lower; sometimes all over" (03/22/2017)  . Chronic  stomach ulcer   . Depression   . GERD (gastroesophageal reflux disease)   . Headache    "a few/week" (03/22/2017)  . Migraine    "monthly recently" (03/22/2017)  . Personality disorder (HCC)   . PTSD (post-traumatic stress disorder)   . Seizures (HCC)    "very random; I lose memory of what happens; usually happens when I'm in bed; probably 2-3/year" (03/22/2017)    Patient Active Problem List   Diagnosis Date Noted  . Nausea & vomiting 03/22/2017    Class: Acute  . Borderline personality disorder (HCC) 12/20/2016  . Suicidal ideations 12/20/2016  . Schizoaffective disorder (HCC) 12/19/2016    History reviewed. No pertinent surgical history.   OB History   None      Home Medications    Prior to Admission medications   Medication Sig Start Date End Date Taking? Authorizing Provider  amphetamine-dextroamphetamine (ADDERALL) 10 MG tablet Take 2 tablets (20 mg total) by mouth 2 (two) times daily with a meal. 03/24/17   Orpah Cobb, MD  desvenlafaxine (PRISTIQ) 100 MG 24 hr tablet Take 100 mg by mouth daily.  12/07/16   [provider]  lurasidone (LATUDA) 40 MG TABS tablet Take 40 mg by mouth daily with breakfast.    [provider]  pantoprazole (PROTONIX) 40 MG tablet Take 1 tablet (40 mg total) by mouth daily. For acid reflux Patient not taking: Reported on 03/22/2017 12/23/16   Armandina Stammer  I, NP  potassium chloride (K-DUR,KLOR-CON) 10 MEQ tablet Take 1 tablet (10 mEq total) by mouth daily. 03/24/17   Orpah Cobb, MD  prazosin (MINIPRESS) 2 MG capsule Take 2 mg by mouth at bedtime. 02/25/17   [provider]  promethazine (PHENERGAN) 25 MG tablet Take 1 tablet (25 mg total) by mouth daily. 03/24/17   Orpah Cobb, MD  traZODone (DESYREL) 50 MG tablet Take 1 tablet (50 mg total) by mouth at bedtime as needed for sleep. Patient taking differently: Take 100 mg by mouth at bedtime.  12/23/16   Sanjuana Kava, NP    Family History Family History    Family history unknown: Yes    Social History Social History   Tobacco Use  . Smoking status: Never Smoker  . Smokeless tobacco: Never Used  Substance Use Topics  . Alcohol use: No    Alcohol/week: 0.0 oz  . Drug use: No     Allergies   Asa [aspirin] and Lithium   Review of Systems Review of Systems  Constitutional: Positive for chills and fatigue.  HENT: Positive for congestion, ear pain and rhinorrhea. Negative for sore throat.   Respiratory: Positive for cough. Negative for shortness of breath.   Cardiovascular: Negative for chest pain.  Gastrointestinal: Positive for abdominal distention, nausea and vomiting.  Musculoskeletal: Positive for myalgias.  All other systems reviewed and are negative.    Physical Exam Updated Vital Signs BP 118/79 (BP Location: Right Arm)   Pulse 96   Temp 98.8 F (37.1 C) (Oral)   Resp 17   SpO2 98%   Physical Exam  Constitutional: She is oriented to person, place, and time. She appears well-developed and well-nourished.  HENT:  Head: Normocephalic and atraumatic.  Right Ear: Tympanic membrane normal.  Left Ear: Tympanic membrane normal.  Nose: Mucosal edema and rhinorrhea present.  Mouth/Throat: Uvula is midline, oropharynx is clear and moist and mucous membranes are normal. No trismus in the jaw.  Eyes: Pupils are equal, round, and reactive to light. Conjunctivae, EOM and lids are normal.  Neck: Full passive range of motion without pain. Neck supple. No neck rigidity.  Full range of motion without any difficulty.  Neck is supple without any rigidity.  Cardiovascular: Normal rate, regular rhythm, normal heart sounds and normal pulses. Exam reveals no gallop and no friction rub.  No murmur heard. Pulmonary/Chest: Effort normal and breath sounds normal.  Lungs clear to auscultation bilaterally.  Symmetric chest rise.  No wheezing, rales, rhonchi.  Abdominal: Soft. Normal appearance. There is generalized tenderness. There is no  rigidity, no guarding and no CVA tenderness.  Abdomen is soft, nondistended.  Patient with generalized tenderness with no focal point.  No rigidity, guarding.  No tenderness noted at McBurney's point.  Musculoskeletal: Normal range of motion.  Neurological: She is alert and oriented to person, place, and time.  Skin: Skin is warm and dry. Capillary refill takes less than 2 seconds.  Psychiatric: She has a normal mood and affect. Her speech is normal.  Nursing note and vitals reviewed.    ED Treatments / Results  Labs (all labs ordered are listed, but only abnormal results are displayed) Labs Reviewed  POC URINE PREG, ED    EKG None  Radiology Dg Chest 2 View  Result Date: 07/26/2017 CLINICAL DATA:  22 year old with fatigue and cough. EXAM: CHEST - 2 VIEW COMPARISON:  03/23/2017 FINDINGS: The heart size and mediastinal contours are within normal limits. Both lungs are clear. The visualized skeletal  structures are unremarkable. IMPRESSION: No active cardiopulmonary disease. Electronically Signed   By: Richarda Overlie M.D.   On: 07/26/2017 18:48    Procedures Procedures (including critical care time)  Medications Ordered in ED Medications - No data to display   Initial Impression / Assessment and Plan / ED Course  I have reviewed the triage vital signs and the nursing notes.  Pertinent labs & imaging results that were available during my care of the patient were reviewed by me and considered in my medical decision making (see chart for details).     22 year old female who presents for evaluation of continued upper respiratory and viral illness symptoms.  Patient reports that she has been having the symptoms for the last week.  Saw Dr. 4 days ago and was given antiemetics, cough medication.  Patient reports that she is not getting worse but states that she came to the ED today because she thinks her symptoms have been going on for too long and is wondering why she is not getting any  better.  Patient denies any fevers.  States that she has had intermittent episodes of posttussive emesis and vomiting.  Sometimes she is able to keep food down and had any difficulty.  Patient reports diffuse myalgias and generalized abdominal pain with no focal point. Patient is afebrile, non-toxic appearing, sitting comfortably on examination table. Vital signs reviewed and stable.  Lungs clear to auscultation bilaterally with any difficulty.  Abdomen is soft, nondistended.  Patient with diffuse tenderness with no focal point.  No rigidity, guarding.  Consider continued viral illness.  History/physical exam is not concerning for appendicitis, diverticulitis.  Plan to check urine pregnancy, chest x-ray.  Analgesics provided.  Chest x-ray reviewed.  Negative for any acute infectious etiology.  Urine pregnancy negative.  We will plan to p.o. challenge patient in the department.  She is able to eat a sandwich and soda without any difficulty.  She has not had any vomiting here in the ED.  Repeat abdominal exam shows improvement in tenderness.  Exam is not concerning for appendicitis or diverticulitis.  Vital signs are stable.  Discussed with patient this is most likely continued viral illness, Sibley influenza-like illness.  Encourage patient to continue taking medications as previously directed.  Encourage primary care follow-up in the next 24 to 48 hours for further evaluation. Patient had ample opportunity for questions and discussion. All patient's questions were answered with full understanding. Strict return precautions discussed. Patient expresses understanding and agreement to plan.   Final Clinical Impressions(s) / ED Diagnoses   Final diagnoses:  Viral URI with cough    ED Discharge Orders    None       Maxwell Caul, PA-C 07/26/17 2340    Arby Barrette, MD 08/01/17 1513

## 2017-07-26 NOTE — ED Triage Notes (Signed)
Per EMS, pt from home complains of cold/flu symptoms x 9 days. Pt states she has cough, cold, congestion, chills. Pt saw PCP 2 days ago, prescribed some cough medicine.   CBG 98 BP 130/78 HR 92

## 2017-11-03 ENCOUNTER — Encounter: Payer: Self-pay | Admitting: Gastroenterology

## 2017-12-09 ENCOUNTER — Encounter (HOSPITAL_COMMUNITY): Payer: Self-pay

## 2017-12-09 ENCOUNTER — Other Ambulatory Visit: Payer: Self-pay

## 2017-12-09 ENCOUNTER — Emergency Department (HOSPITAL_COMMUNITY)
Admission: EM | Admit: 2017-12-09 | Discharge: 2017-12-10 | Disposition: A | Discharge status: Home / Self Care | Payer: Medicaid Other | Attending: Emergency Medicine | Admitting: Emergency Medicine

## 2017-12-09 DIAGNOSIS — R45851 Suicidal ideations: Secondary | ICD-10-CM | POA: Insufficient documentation

## 2017-12-09 DIAGNOSIS — F603 Borderline personality disorder: Secondary | ICD-10-CM | POA: Diagnosis present

## 2017-12-09 DIAGNOSIS — R112 Nausea with vomiting, unspecified: Secondary | ICD-10-CM | POA: Insufficient documentation

## 2017-12-09 DIAGNOSIS — Z79899 Other long term (current) drug therapy: Secondary | ICD-10-CM | POA: Diagnosis not present

## 2017-12-09 DIAGNOSIS — R109 Unspecified abdominal pain: Secondary | ICD-10-CM | POA: Diagnosis not present

## 2017-12-09 DIAGNOSIS — F321 Major depressive disorder, single episode, moderate: Secondary | ICD-10-CM | POA: Insufficient documentation

## 2017-12-09 DIAGNOSIS — F259 Schizoaffective disorder, unspecified: Secondary | ICD-10-CM | POA: Diagnosis present

## 2017-12-09 LAB — COMPREHENSIVE METABOLIC PANEL
ALT: 15 U/L (ref 0–44)
AST: 16 U/L (ref 15–41)
Albumin: 3.5 g/dL (ref 3.5–5.0)
Alkaline Phosphatase: 114 U/L (ref 38–126)
Anion gap: 11 (ref 5–15)
BUN: 9 mg/dL (ref 6–20)
CHLORIDE: 109 mmol/L (ref 98–111)
CO2: 23 mmol/L (ref 22–32)
CREATININE: 0.9 mg/dL (ref 0.44–1.00)
Calcium: 9.2 mg/dL (ref 8.9–10.3)
GFR calc Af Amer: >60 mL/min (ref >60)
Glucose, Bld: 80 mg/dL (ref 70–99)
POTASSIUM: 3.5 mmol/L (ref 3.5–5.1)
Sodium: 143 mmol/L (ref 135–145)
TOTAL PROTEIN: 7.2 g/dL (ref 6.5–8.1)
Total Bilirubin: 0.6 mg/dL (ref 0.3–1.2)

## 2017-12-09 LAB — CBC WITH DIFFERENTIAL/PLATELET
Basophils Absolute: 0 10*3/uL (ref 0.0–0.1)
Basophils Relative: 0 %
EOS ABS: 0.2 10*3/uL (ref 0.0–0.7)
Eosinophils Relative: 1 %
HCT: 42.7 % (ref 36.0–46.0)
HEMOGLOBIN: 13.9 g/dL (ref 12.0–15.0)
LYMPHS ABS: 3.6 10*3/uL (ref 0.7–4.0)
LYMPHS PCT: 23 %
MCH: 29.1 pg (ref 26.0–34.0)
MCHC: 32.6 g/dL (ref 30.0–36.0)
MCV: 89.3 fL (ref 78.0–100.0)
MONOS PCT: 6 %
Monocytes Absolute: 0.9 10*3/uL (ref 0.1–1.0)
NEUTROS PCT: 70 %
Neutro Abs: 11.1 10*3/uL — ABNORMAL HIGH (ref 1.7–7.7)
Platelets: 422 10*3/uL — ABNORMAL HIGH (ref 150–400)
RBC: 4.78 MIL/uL (ref 3.87–5.11)
RDW: 12.9 % (ref 11.5–15.5)
WBC: 15.8 10*3/uL — ABNORMAL HIGH (ref 4.0–10.5)

## 2017-12-09 LAB — RAPID URINE DRUG SCREEN, HOSP PERFORMED
AMPHETAMINES: POSITIVE — AB
BENZODIAZEPINES: NOT DETECTED
Barbiturates: NOT DETECTED
Cocaine: NOT DETECTED
OPIATES: NOT DETECTED
Tetrahydrocannabinol: NOT DETECTED

## 2017-12-09 LAB — SALICYLATE LEVEL

## 2017-12-09 LAB — ETHANOL: Alcohol, Ethyl (B): <10 mg/dL (ref <10)

## 2017-12-09 LAB — LIPASE, BLOOD: Lipase: 31 U/L (ref 11–51)

## 2017-12-09 LAB — ACETAMINOPHEN LEVEL: Acetaminophen (Tylenol), Serum: <10 ug/mL — ABNORMAL LOW (ref 10–30)

## 2017-12-09 MED ORDER — LURASIDONE HCL 40 MG PO TABS
40.0000 mg | ORAL_TABLET | Freq: Every day | ORAL | Status: DC
  Filled 2017-12-09: qty 1

## 2017-12-09 MED ORDER — ALUM & MAG HYDROXIDE-SIMETH 200-200-20 MG/5ML PO SUSP: 30.0000 mL | Freq: Four times a day (QID) | ORAL | Status: DC | PRN

## 2017-12-09 MED ORDER — ONDANSETRON HCL 4 MG PO TABS: 4.0000 mg | ORAL_TABLET | Freq: Three times a day (TID) | ORAL | Status: DC | PRN

## 2017-12-09 MED ORDER — PRAZOSIN HCL 1 MG PO CAPS
2.0000 mg | ORAL_CAPSULE | Freq: Every day | ORAL | Status: DC
  Administered 2017-12-09: 2 mg via ORAL
  Filled 2017-12-09 (×2): qty 2

## 2017-12-09 MED ORDER — TRAZODONE HCL 100 MG PO TABS
100.0000 mg | ORAL_TABLET | Freq: Every day | ORAL | Status: DC
  Administered 2017-12-09: 100 mg via ORAL
  Filled 2017-12-09: qty 1

## 2017-12-09 MED ORDER — VENLAFAXINE HCL ER 75 MG PO CP24
150.0000 mg | ORAL_CAPSULE | Freq: Every day | ORAL | Status: DC
  Administered 2017-12-10: 150 mg via ORAL
  Filled 2017-12-09: qty 2

## 2017-12-09 MED ORDER — AMPHETAMINE-DEXTROAMPHETAMINE 20 MG PO TABS
20.0000 mg | ORAL_TABLET | Freq: Two times a day (BID) | ORAL | Status: DC
  Administered 2017-12-10: 20 mg via ORAL
  Filled 2017-12-09: qty 1

## 2017-12-09 MED ORDER — PANTOPRAZOLE SODIUM 40 MG PO TBEC
40.0000 mg | DELAYED_RELEASE_TABLET | Freq: Every day | ORAL | Status: DC
  Administered 2017-12-09 – 2017-12-10 (×2): 40 mg via ORAL
  Filled 2017-12-09 (×2): qty 1

## 2017-12-09 NOTE — ED Notes (Signed)
Pt Belongings: 1Bag:  Black flip flops; flower pattern dress; set of house keys; phone charging pad; black phone w/ red case- cards attached to back [facility] Learner's Permit, [id number], [employer]-[id number]] Jewelry (silver and blue necklace) Contacts @ bedside   **[address]**
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
## 2017-12-09 NOTE — ED Notes (Signed)
Patient reports intermittent SI and AH. Patient denies HI/VH. Plan of care discussed. Meal given. Encouragement and support provided and safety maintain. Q 15 min safety checks in place and video monitoring.

## 2017-12-09 NOTE — ED Notes (Signed)
Bed: [facility] Expected date:  Expected time:  Means of arrival:  Comments: RM 29
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
## 2017-12-09 NOTE — Progress Notes (Signed)
Informed Nurse, Raschell of disposition. Also informed Dr. Adela Lank.
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
## 2017-12-10 DIAGNOSIS — F603 Borderline personality disorder: Secondary | ICD-10-CM

## 2017-12-10 LAB — URINALYSIS, ROUTINE W REFLEX MICROSCOPIC
BILIRUBIN URINE: NEGATIVE
Glucose, UA: NEGATIVE mg/dL
Hgb urine dipstick: NEGATIVE
KETONES UR: NEGATIVE mg/dL
Leukocytes, UA: NEGATIVE
NITRITE: NEGATIVE
PROTEIN: NEGATIVE mg/dL
Specific Gravity, Urine: 1.024 (ref 1.005–1.030)
pH: 5 (ref 5.0–8.0)

## 2017-12-10 LAB — PREGNANCY, URINE: Preg Test, Ur: NEGATIVE

## 2017-12-10 NOTE — Discharge Instructions (Signed)
For your behavioral health needs, you are advised to continue treatment with Ellis Savage, NP and with Elray Buba, LCSW, your current providers at [facility]:       [facility]      [address]      [place], [place] [address]      [phone]
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
## 2017-12-10 NOTE — ED Notes (Signed)
Dr Andee Lineman updated concerning abd pain-monitor for changing pain/fever

## 2017-12-10 NOTE — BH Assessment (Signed)
[facility] Assessment Progress Note  Per Juanetta Beets, DO, this pt does not require psychiatric hospitalization at this time.  Pt is to be discharged from [facility] with recommendation to continue treatment with Ellis Savage, NP and with Elray Buba, LCSW, her current providers at [facility].  This has been included in pt's discharge instructions.  Pt's nurse, Wille Celeste, has been notified.  Doylene Canning, MA Triage Specialist [phone]
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
## 2017-12-10 NOTE — ED Notes (Signed)
[employer] officer in with pt
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
## 2017-12-22 ENCOUNTER — Ambulatory Visit (INDEPENDENT_AMBULATORY_CARE_PROVIDER_SITE_OTHER): Payer: Medicaid Other | Admitting: Gastroenterology

## 2017-12-22 ENCOUNTER — Encounter: Payer: Self-pay | Admitting: Gastroenterology

## 2017-12-22 VITALS — BP 108/66 | HR 98 | Ht 62.0 in | Wt 245.4 lb

## 2017-12-22 DIAGNOSIS — R1084 Generalized abdominal pain: Secondary | ICD-10-CM | POA: Diagnosis not present

## 2017-12-22 DIAGNOSIS — K5909 Other constipation: Secondary | ICD-10-CM

## 2017-12-22 NOTE — Patient Instructions (Addendum)
Constipation:  Increase your intake of water, fruits/vegetables and your activity level (as best you can).  Ducosate stool softener, 100 mg twice daily. If not improved in 1 week after starting that, please add:  Miralax powder  1 capful in a glass of water or juice once daily.  It was a pleasure to see you today!  Dr. Myrtie Neither
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
## 2018-01-17 ENCOUNTER — Ambulatory Visit (HOSPITAL_COMMUNITY)
Admission: RE | Admit: 2018-01-17 | Discharge: 2018-01-17 | DRG: 885 | Disposition: A | Discharge status: Home / Self Care | Payer: Medicaid Other | Attending: Psychiatry | Admitting: Psychiatry

## 2018-01-17 ENCOUNTER — Emergency Department (HOSPITAL_COMMUNITY)
Admission: EM | Admit: 2018-01-17 | Discharge: 2018-01-17 | Discharge status: Absent without leave | Payer: Medicaid Other

## 2018-01-17 ENCOUNTER — Other Ambulatory Visit: Payer: Self-pay

## 2018-01-17 ENCOUNTER — Encounter (HOSPITAL_COMMUNITY): Payer: Self-pay | Admitting: Obstetrics and Gynecology

## 2018-01-17 ENCOUNTER — Emergency Department (HOSPITAL_COMMUNITY)
Admission: EM | Admit: 2018-01-17 | Discharge: 2018-01-17 | Disposition: A | Discharge status: Home / Self Care | Payer: Medicaid Other | Attending: Emergency Medicine | Admitting: Emergency Medicine

## 2018-01-17 DIAGNOSIS — F909 Attention-deficit hyperactivity disorder, unspecified type: Secondary | ICD-10-CM | POA: Insufficient documentation

## 2018-01-17 DIAGNOSIS — F251 Schizoaffective disorder, depressive type: Secondary | ICD-10-CM | POA: Diagnosis not present

## 2018-01-17 DIAGNOSIS — F603 Borderline personality disorder: Secondary | ICD-10-CM | POA: Diagnosis present

## 2018-01-17 DIAGNOSIS — Z79899 Other long term (current) drug therapy: Secondary | ICD-10-CM | POA: Insufficient documentation

## 2018-01-17 DIAGNOSIS — F250 Schizoaffective disorder, bipolar type: Secondary | ICD-10-CM | POA: Diagnosis not present

## 2018-01-17 DIAGNOSIS — F609 Personality disorder, unspecified: Secondary | ICD-10-CM | POA: Diagnosis not present

## 2018-01-17 DIAGNOSIS — Z886 Allergy status to analgesic agent status: Secondary | ICD-10-CM | POA: Diagnosis not present

## 2018-01-17 DIAGNOSIS — K219 Gastro-esophageal reflux disease without esophagitis: Secondary | ICD-10-CM | POA: Diagnosis not present

## 2018-01-17 DIAGNOSIS — F840 Autistic disorder: Secondary | ICD-10-CM | POA: Insufficient documentation

## 2018-01-17 DIAGNOSIS — F259 Schizoaffective disorder, unspecified: Secondary | ICD-10-CM | POA: Diagnosis present

## 2018-01-17 DIAGNOSIS — Z888 Allergy status to other drugs, medicaments and biological substances status: Secondary | ICD-10-CM | POA: Diagnosis not present

## 2018-01-17 DIAGNOSIS — Z0489 Encounter for examination and observation for other specified reasons: Secondary | ICD-10-CM | POA: Diagnosis present

## 2018-01-17 DIAGNOSIS — F4310 Post-traumatic stress disorder, unspecified: Secondary | ICD-10-CM | POA: Diagnosis not present

## 2018-01-17 DIAGNOSIS — R45851 Suicidal ideations: Secondary | ICD-10-CM | POA: Insufficient documentation

## 2018-01-17 DIAGNOSIS — Z833 Family history of diabetes mellitus: Secondary | ICD-10-CM | POA: Diagnosis not present

## 2018-01-17 LAB — BASIC METABOLIC PANEL
Anion gap: 9 (ref 5–15)
BUN: 9 mg/dL (ref 6–20)
CO2: 24 mmol/L (ref 22–32)
Calcium: 9.3 mg/dL (ref 8.9–10.3)
Chloride: 105 mmol/L (ref 98–111)
Creatinine, Ser: 0.87 mg/dL (ref 0.44–1.00)
GFR calc Af Amer: >60 mL/min (ref >60)
GFR calc non Af Amer: >60 mL/min (ref >60)
Glucose, Bld: 102 mg/dL — ABNORMAL HIGH (ref 70–99)
Potassium: 3.7 mmol/L (ref 3.5–5.1)
Sodium: 138 mmol/L (ref 135–145)

## 2018-01-17 LAB — CBC WITH DIFFERENTIAL/PLATELET
Abs Immature Granulocytes: 0.07 10*3/uL (ref 0.00–0.07)
Basophils Absolute: 0.1 10*3/uL (ref 0.0–0.1)
Basophils Relative: 0 %
Eosinophils Absolute: 0.3 10*3/uL (ref 0.0–0.5)
Eosinophils Relative: 2 %
HCT: 44.7 % (ref 36.0–46.0)
Hemoglobin: 14.4 g/dL (ref 12.0–15.0)
Immature Granulocytes: 1 %
Lymphocytes Relative: 29 %
Lymphs Abs: 4.4 10*3/uL — ABNORMAL HIGH (ref 0.7–4.0)
MCH: 28.9 pg (ref 26.0–34.0)
MCHC: 32.2 g/dL (ref 30.0–36.0)
MCV: 89.8 fL (ref 80.0–100.0)
Monocytes Absolute: 0.8 10*3/uL (ref 0.1–1.0)
Monocytes Relative: 5 %
Neutro Abs: 9.7 10*3/uL — ABNORMAL HIGH (ref 1.7–7.7)
Neutrophils Relative %: 63 %
Platelets: 363 10*3/uL (ref 150–400)
RBC: 4.98 MIL/uL (ref 3.87–5.11)
RDW: 13.1 % (ref 11.5–15.5)
WBC: 15.3 10*3/uL — ABNORMAL HIGH (ref 4.0–10.5)
nRBC: 0 % (ref 0.0–0.2)

## 2018-01-17 LAB — URINALYSIS, ROUTINE W REFLEX MICROSCOPIC
Bilirubin Urine: NEGATIVE
Glucose, UA: NEGATIVE mg/dL
Hgb urine dipstick: NEGATIVE
Ketones, ur: NEGATIVE mg/dL
Nitrite: NEGATIVE
Protein, ur: NEGATIVE mg/dL
Specific Gravity, Urine: 1.025 (ref 1.005–1.030)
Squamous Epithelial / LPF: >50 — ABNORMAL HIGH (ref 0–5)
pH: 5 (ref 5.0–8.0)

## 2018-01-17 LAB — RAPID URINE DRUG SCREEN, HOSP PERFORMED
Amphetamines: POSITIVE — AB
Barbiturates: NOT DETECTED
Benzodiazepines: NOT DETECTED
Cocaine: NOT DETECTED
Opiates: NOT DETECTED
Tetrahydrocannabinol: NOT DETECTED

## 2018-01-17 LAB — I-STAT BETA HCG BLOOD, ED (MC, WL, AP ONLY): I-stat hCG, quantitative: <5 m[IU]/mL (ref <5)

## 2018-01-17 LAB — ETHANOL: Alcohol, Ethyl (B): <10 mg/dL (ref <10)

## 2018-01-17 NOTE — ED Notes (Signed)
Pt discharged safely with resources.  All belongings were returned to patient.  Pt was in no distress and contracted for safety.

## 2018-01-17 NOTE — BH Assessment (Signed)
[facility] Assessment Progress Note  Per Nelly Rout, MD, this pt does not require psychiatric hospitalization at this time.  Pt is to be discharged from [facility] with recommendation to continue treatment at [facility].  She is also to be provided with information regarding the Production manager Engagement at [facility] for LGBTQ support.  These recommenations have been included in pt's discharge instructions.  Pt's nurse, Kendal Hymen, has been notified.  Doylene Canning, MA Triage Specialist [phone]

## 2018-01-17 NOTE — ED Triage Notes (Signed)
Pt reports she is having suicidal ideation and went to [facility] and was told there were no "Gender fluid rooms" and felt uncomfortable at [facility].  Pt reports she felt like she wants to take all her medications and hope it kills her.  Pt reports her preferred personal pronoun is "Them, they, they're" Pt denies chance of pregnancy, alcohol, or drug use.

## 2018-01-17 NOTE — Discharge Instructions (Signed)
For your behavioral health needs, you are advised to continue treatment at [facility]:       [facility]      [address]      [place], [place] [address]      [phone]  For other social support, you are advised to follow up with the Office of Intercultural Engagement at [facility].  They offer advocacy and other supportive services for the LGBTQ community:       Production manager Engagement      [facility] [place] [facility] [place] [facility] [place], [place] [address]      [phone]

## 2018-01-17 NOTE — ED Notes (Addendum)
Pt stated "I'm pansexual but gender fluid.  I don't feel like I belong.  I would take a bunch of pills.  Everyone that is Ephriam Knuckles is mean, like Donald Trump."

## 2018-01-17 NOTE — BH Assessment (Addendum)
Tele Assessment Note   Patient Name: Minta Fair MRN: [id number] Referring Physician: Nira Conn, NP Location of Patient: [facility] Location of Provider: Behavioral Health TTS Department  Corlis Hove Hartsough is an 22 y.o. female presenting with SI with plan to overdose on prescription medications. Patient reported SI and depression since young child, but not taking medication until starting college. Patient reported "medication help me feel better, they just don't cure me". Patient reported sometimes she forgets taking her medications and didn't take them tonight. Patient reported auditory hallucinations stating "you are a coward for not killing yourself, you are a idiot for calling mobile crisis". Patient reported seeing Misty Stanley- psychiatrist and Rene Kocher- outpatient at [facility]. Patient reported increased depressive symptoms, lack of interest, tired/exhausted, can't get out of bed, increased anxiety and crying spells. Patient reported that her best friend died 04-Nov-2017, patient didn't share cause of death. Patient reported getting bullied for the past 2 months online and through text messages by unknown people, text messages stating "you are a serial killer and to burn myself with acid". 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 When asked about family support, patient reported "I have no support, they never accepted me, because I am gay, either too fat or too ugly. Patient was cooperative and anxious during assessment.  Patient denies HI and alcohol and drug usage.   Diagnosis: Schizoaffective Disorder  Past Medical History:  Past Medical History:  Diagnosis Date  . ADHD   . Autism   . Bipolar disorder (HCC)   . Chronic back pain    "mostly lower; sometimes all over" (03/22/2017)  . Chronic stomach ulcer   . Depression   . GERD (gastroesophageal reflux disease)   . Headache    "a few/week" (03/22/2017)  . Migraine     "monthly recently" (03/22/2017)  . Personality disorder (HCC)   . PTSD (post-traumatic stress disorder)   . Seizures (HCC)    "very random; I lose memory of what happens; usually happens when I'm in bed; probably 2-3/year" (03/22/2017)    No past surgical history on file.  Family History:  Family History  Problem Relation Age of Onset  . Diabetes Mother   . Hypertension Maternal Grandmother   . Hyperlipidemia Maternal Grandmother   . Diabetes Maternal Grandmother     Social History:  reports that she has never smoked. She has never used smokeless tobacco. She reports that she does not drink alcohol or use drugs.  Additional Social History:  Alcohol / Drug Use Pain Medications: see MAR Prescriptions: see MAR Over the Counter: see MAR  CIWA:   COWS:    Allergies:  Allergies  Allergen Reactions  . Asa [Aspirin] Anaphylaxis  . Lithium Hives    Home Medications:  (Not in a hospital admission)  OB/GYN Status:  No LMP recorded. Patient has had an implant.  General Assessment Data Location of Assessment: [facility] Assessment Services TTS Assessment: In system Is this a Tele or Face-to-Face Assessment?: Face-to-Face Is this an Initial Assessment or a Re-assessment for this encounter?: Initial Assessment Patient Accompanied by:: Other(Mobile Crisis) Language Other than English: No Living Arrangements: (roommates) What gender do you identify as?: Female Marital status: Single Pregnancy Status: Unknown Living Arrangements: Non-relatives/Friends(3 roommates) Can pt return to current living arrangement?: Yes Admission Status: Voluntary Is patient capable of signing voluntary admission?: Yes Referral Source: Other(Mobile Crisis)  Medical Screening Exam [facility] Walk-in ONLY) Medical Exam completed: Yes  Crisis Care Plan Living Arrangements: Non-relatives/Friends(3  roommates) Legal Guardian: (self) Name of Psychiatrist: Misty Stanley at [facility]) Name of Therapist:  Rene Kocher at [facility])  Education Status Is patient currently in school?: Yes Current Grade: (Sophomore ) 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 : Yes Suicidal Intent: Yes-Currently Present Has patient had any suicidal intent within the past 6 months prior to admission? : Yes Is patient at risk for suicide?: Yes Suicidal Plan?: Yes-Currently Present Has patient had any suicidal plan within the past 6 months prior to admission? : Yes Specify Current Suicidal Plan: (overdose on medications) Access to Means: Yes Specify Access to Suicidal Means: (prescription medications in the home) What has been your use of drugs/alcohol within the last 12 months?: (none) Triggers for Past Attempts: Family contact, Hallucinations, Unpredictable Intentional Self Injurious Behavior: None Family Suicide History: No Recent stressful life event(s): Loss (Comment)(best friend died 2017-10-16) Persecutory voices/beliefs?: No Depression: Yes Depression Symptoms: Insomnia, Tearfulness, Isolating, Fatigue, Guilt, Loss of interest in usual pleasures, Feeling worthless/self pity Substance abuse history and/or treatment for substance abuse?: No  Risk to Others within the past 6 months Homicidal Ideation: No Does patient have any lifetime risk of violence toward others beyond the six months prior to admission? : No Thoughts of Harm to Others: No Current Homicidal Intent: No Current Homicidal Plan: No Access to Homicidal Means: No History of harm to others?: No Assessment of Violence: None Noted Does patient have access to weapons?: No Criminal Charges Pending?: No Does patient have a court date: No Is patient on probation?: No  Psychosis Hallucinations: Auditory("your a coward for not killing  yourself") Delusions: None noted  Mental Status Report Appearance/Hygiene: Unremarkable Eye Contact: Fair Motor Activity: Freedom of movement Speech: Logical/coherent Level of Consciousness: Alert Mood: Depressed, Anxious, Sad, Helpless, Guilty, Worthless, low self-esteem Affect: Depressed, Anxious, Sad Anxiety Level: Severe Thought Processes: Coherent Judgement: Impaired Orientation: Person, Place, Time, Situation Obsessive Compulsive Thoughts/Behaviors: None  Cognitive Functioning Concentration: Fair Memory: Remote Impaired, Recent Impaired Is patient IDD: Yes Level of Function: (2nd year college student, hx of autism) Is IQ score available?: No Insight: Fair Impulse Control: Fair Appetite: Good Have you had any weight changes? : No Change Sleep: No Change Total Hours of Sleep: (6) Vegetative Symptoms: Staying in bed  ADLScreening [facility] Assessment Services) Patient's cognitive ability adequate to safely complete daily activities?: Yes Patient able to express need for assistance with ADLs?: Yes Independently performs ADLs?: Yes (appropriate for developmental age)  Prior Inpatient Therapy Prior Inpatient Therapy: Yes Prior Therapy Dates: (12/2016) Prior Therapy Facilty/Provider(s): [facility]) Reason for Treatment: (SI)  Prior Outpatient Therapy Prior Outpatient Therapy: Yes Prior Therapy Dates: (present) Prior Therapy Facilty/Provider(s): ([facility], Insurance claims handler- psychiatrist and [place]- opt) Reason for Treatment: (mental illness) Does patient have an ACCT team?: No Does patient have Intensive In-House Services?  : No Does patient have [facility] services? : No Does patient have P4CC services?: No  ADL Screening (condition at time of admission) Patient's cognitive ability adequate to safely complete daily activities?: Yes Patient able to express need for assistance with ADLs?: Yes Independently performs ADLs?: Yes (appropriate for developmental  age)  Disposition: Patient reported to [facility] "I am gender fluid and don't see myself having a female or female roommate" Patient refused having a roommate during process of review. Chyrl Civatte, AC, we  do not have an appropriate bed at [facility] due to patient refusal of roommate. Patient sent to [facility] ED. TTS to secure placement elsewhere. Disposition Initial Assessment Completed for this Encounter: Yes Disposition of Patient: Admit Type of inpatient treatment program: Adult Patient refused recommended treatment: No  This service was provided via telemedicine using a 2-way, interactive audio and video technology.  Names of all persons participating in this telemedicine service and their role in this encounter. Name: Darrel Reach Role: patient  Name: Terri Role: mobile crisis  Name: Al Corpus, [facility] Role: TTS Clinician  Name:  Role:     Burnetta Sabin 01/17/2018 1:15 AM

## 2018-01-17 NOTE — H&P (Addendum)
Behavioral Health Medical Screening Exam  Tamara [name] is an 22 y.o. female.  Total Time spent with patient: 20 minutes  Psychiatric Specialty Exam: Physical Exam  Constitutional: She is oriented to person, place, and time. She appears well-developed and well-nourished. No distress.  HENT:  Head: Normocephalic and atraumatic.  Right Ear: External ear normal.  Left Ear: External ear normal.  Eyes: Pupils are equal, round, and reactive to light. Right eye exhibits no discharge. Left eye exhibits no discharge. No scleral icterus.  Respiratory: Effort normal. No respiratory distress.  Musculoskeletal: Normal range of motion.  Neurological: She is alert and oriented to person, place, and time.  Skin: Skin is warm and dry. She is not diaphoretic.  Psychiatric: Her speech is normal. Her mood appears anxious. Her affect is blunt. She is not withdrawn and not actively hallucinating. Thought content is delusional. Thought content is not paranoid. Cognition and memory are normal. She expresses impulsivity and inappropriate judgment. She exhibits a depressed mood. She expresses suicidal ideation. She expresses no homicidal ideation. She expresses suicidal plans.    Review of Systems  Constitutional: Negative for chills, fever and weight loss.  Psychiatric/Behavioral: Positive for depression, hallucinations and suicidal ideas. Negative for memory loss and substance abuse. The patient is nervous/anxious and has insomnia.   All other systems reviewed and are negative.   There were no vitals taken for this visit.There is no height or weight on file to calculate BMI.  General Appearance: Casual and Well Groomed  Eye Contact:  Fair  Speech:  Clear and Coherent and Normal Rate  Volume:  Normal  Mood:  Anxious, Depressed, Dysphoric, Hopeless and Worthless  Affect:  Blunt, Congruent and Depressed  Thought Process:  Coherent, Goal Directed and Descriptions of Associations: Intact  Orientation:   Full (Time, Place, and Person)  Thought Content:  Logical and Hallucinations: Auditory  Suicidal Thoughts:  Yes.  with intent/plan  Homicidal Thoughts:  No  Memory:  Immediate;   Fair Recent;   Fair  Judgement:  Impaired  Insight:  Lacking  Psychomotor Activity:  Restlessness  Concentration: Concentration: Fair and Attention Span: Fair  Recall:  [employer] of Knowledge:Fair  Language: Good  Akathisia:  No  Handed:  Right  AIMS (if indicated):     Assets:  Communication Skills Desire for Improvement Financial Resources/Insurance Housing Leisure Time Physical Health  Sleep:       Musculoskeletal: Strength & Muscle Tone: within normal limits Gait & Station: normal   Recommendations:  Based on my evaluation the patient does not appear to have an emergency medical condition.   Patient states that she is gender fluid and that she can not tolerate sharing a room with a female or female. No appropriated beds available at this time. Will transfer to [facility] for placement.  Jackelyn Poling, NP 01/17/2018, 1:11 AM

## 2018-01-17 NOTE — ED Notes (Addendum)
Patient reported to [facility] "I am gender fluid and don't see myself having a female or female roommate" Patient refused having a roommate during process of review. Joann, AC, we do not have an appropriate bed at [facility] due to patient refusal of roommate. Patient sent to [facility] ED. TTS to secure placement elsewhere.

## 2018-01-17 NOTE — BHH Suicide Risk Assessment (Signed)
Suicide Risk Assessment  Discharge Assessment   [facility] Discharge Suicide Risk Assessment   Principal Problem: Schizoaffective disorder [facility]) Discharge Diagnoses:  Patient Active Problem List   Diagnosis Date Noted  . Borderline personality disorder (HCC) [F60.3] 12/20/2016    Priority: High  . Schizoaffective disorder (HCC) [F25.9] 12/19/2016    Priority: High    Total Time spent with patient: 45 minutes  Musculoskeletal: Strength & Muscle Tone: within normal limits Gait & Station: normal Patient leans: N/A  Psychiatric Specialty Exam:   Blood pressure 119/70, pulse 100, temperature 98.3 F (36.8 C), temperature source Oral, resp. rate 16, height 5\' 1"  (1.549 m), weight 111.1 kg, SpO2 91 %.Body mass index is 46.29 kg/m.  General Appearance: Casual  Eye Contact::  Good  Speech:  Normal Rate409  Volume:  Normal  Mood:  Euthymic  Affect:  Congruent  Thought Process:  Coherent and Descriptions of Associations: Intact  Orientation:  Full (Time, Place, and Person)  Thought Content:  WDL and Logical  Suicidal Thoughts:  No  Homicidal Thoughts:  No  Memory:  Immediate;   Good Recent;   Good Remote;   Good  Judgement:  Fair  Insight:  Fair  Psychomotor Activity:  Normal  Concentration:  Good  Recall:  Good  Fund of Knowledge:Good  Language: Good  Akathisia:  No  Handed:  Right  AIMS (if indicated):     Assets:  Housing Leisure Time Physical Health Resilience Social Support Vocational/Educational  Sleep:     Cognition: WNL  ADL's:  Intact   Mental Status Per Nursing Assessment::   On Admission:   22 yo female who got upset over a cyberbully and came to the ED.  Today, she denies suicidal/homicidal ideations, hallucinations, and substance abuse.  Calm and cooperative, established patient at [facility].  Wants information on LGBT at [facility], provided.  Demographic Factors:  Adolescent or young adult and Gay, lesbian, or bisexual orientation  Loss  Factors: NA  Historical Factors: NA  Risk Reduction Factors:   Sense of responsibility to family, Living with another person, especially a relative, Positive social support and Positive therapeutic relationship  Continued Clinical Symptoms:  NOne  Cognitive Features That Contribute To Risk:  None    Suicide Risk:  Minimal: No identifiable suicidal ideation.  Patients presenting with no risk factors but with morbid ruminations; may be classified as minimal risk based on the severity of the depressive symptoms    Plan Of Care/Follow-up recommendations:  Activity:  as tolerated Diet:  heart healthy diet  LORD, JAMISON, NP 01/17/2018, 9:38 AM

## 2018-01-19 NOTE — ED Provider Notes (Signed)
[facility]-EMERGENCY DEPT Provider Note   CSN: [id number] Arrival date & time: 01/17/18  0251     History   Chief Complaint Chief Complaint  Patient presents with  . Medical Clearance  . Suicidal    HPI Tamara [name] is a 22 y.o. female.  HPI Patient presents to the emergency department with has had a ideations and feeling depressed.  The patient states that this is been building over time.  She states nothing seems to make her condition better or worse.  The patient denies chest pain, shortness of breath, headache,blurred vision, neck pain, fever, cough, weakness, numbness, dizziness, anorexia, edema, abdominal pain, nausea, vomiting, diarrhea, rash, back pain, dysuria, hematemesis, bloody stool, near syncope, or syncope. Past Medical History:  Diagnosis Date  . ADHD   . Autism   . Bipolar disorder (HCC)   . Chronic back pain    "mostly lower; sometimes all over" (03/22/2017)  . Chronic stomach ulcer   . Depression   . GERD (gastroesophageal reflux disease)   . Headache    "a few/week" (03/22/2017)  . Migraine    "monthly recently" (03/22/2017)  . Personality disorder (HCC)   . PTSD (post-traumatic stress disorder)   . Seizures (HCC)    "very random; I lose memory of what happens; usually happens when I'm in bed; probably 2-3/year" (03/22/2017)    Patient Active Problem List   Diagnosis Date Noted  . Borderline personality disorder (HCC) 12/20/2016  . Schizoaffective disorder (HCC) 12/19/2016    History reviewed. No pertinent surgical history.   OB History   None      Home Medications    Prior to Admission medications   Medication Sig Start Date End Date Taking? Authorizing Provider  amphetamine-dextroamphetamine (ADDERALL) 30 MG tablet Take 30 mg by mouth 2 (two) times daily.   Yes [provider]  atorvastatin (LIPITOR) 20 MG tablet Take 20 mg by mouth daily.   Yes [provider]  Cholecalciferol (VITAMIN D  PO) Take 5,000 Units by mouth daily.   Yes [provider]  ibuprofen (ADVIL,MOTRIN) 200 MG tablet Take 400 mg by mouth every 6 (six) hours as needed for moderate pain.   Yes [provider]  lurasidone (LATUDA) 80 MG TABS tablet Take 80 mg by mouth daily with breakfast.   Yes [provider]  Melatonin 10 MG TABS Take 10 mg by mouth at bedtime.    Yes [provider]  pantoprazole (PROTONIX) 40 MG tablet Take 1 tablet (40 mg total) by mouth daily. For acid reflux 12/23/16  Yes Nwoko, Nicole KindredAgnes I, NP  vortioxetine HBr (TRINTELLIX) 10 MG TABS tablet Take 10 mg by mouth daily.   Yes [provider]    Family History Family History  Problem Relation Age of Onset  . Diabetes Mother   . Hypertension Maternal Grandmother   . Hyperlipidemia Maternal Grandmother   . Diabetes Maternal Grandmother     Social History Social History   Tobacco Use  . Smoking status: Never Smoker  . Smokeless tobacco: Never Used  Substance Use Topics  . Alcohol use: No    Alcohol/week: 0.0 standard drinks  . Drug use: No     Allergies   Asa [aspirin] and Lithium   Review of Systems Review of Systems All other systems negative except as documented in the HPI. All pertinent positives and negatives as reviewed in the HPI.  Physical Exam Updated Vital Signs BP 121/85 (BP Location: Right Arm)  Pulse 82   Temp 97.6 F (36.4 C) (Oral)   Resp 16   Ht 5\' 1"  (1.549 m)   Wt 111.1 kg   SpO2 98%   BMI 46.29 kg/m   Physical Exam  Constitutional: She is oriented to person, place, and time. She appears well-developed and well-nourished. No distress.  HENT:  Head: Normocephalic and atraumatic.  Mouth/Throat: Oropharynx is clear and moist.  Eyes: Pupils are equal, round, and reactive to light.  Neck: Normal range of motion. Neck supple.  Cardiovascular: Normal rate, regular rhythm and normal heart sounds. Exam reveals no gallop and no friction rub.  No murmur  heard. Pulmonary/Chest: Effort normal and breath sounds normal. No respiratory distress. She has no wheezes.  Abdominal: Soft. Bowel sounds are normal. She exhibits no distension. There is no tenderness.  Neurological: She is alert and oriented to person, place, and time. She exhibits normal muscle tone. Coordination normal.  Skin: Skin is warm and dry. Capillary refill takes less than 2 seconds. No rash noted. No erythema.  Psychiatric: She has a normal mood and affect. Her behavior is normal.  Nursing note and vitals reviewed.    ED Treatments / Results  Labs (all labs ordered are listed, but only abnormal results are displayed) Labs Reviewed  BASIC METABOLIC PANEL - Abnormal; Notable for the following components:      Result Value   Glucose, Bld 102 (*)    All other components within normal limits  CBC WITH DIFFERENTIAL/PLATELET - Abnormal; Notable for the following components:   WBC 15.3 (*)    Neutro Abs 9.7 (*)    Lymphs Abs 4.4 (*)    All other components within normal limits  URINALYSIS, ROUTINE W REFLEX MICROSCOPIC - Abnormal; Notable for the following components:   APPearance CLOUDY (*)    Leukocytes, UA SMALL (*)    Bacteria, UA MANY (*)    Squamous Epithelial / LPF >50 (*)    All other components within normal limits  RAPID URINE DRUG SCREEN, HOSP PERFORMED - Abnormal; Notable for the following components:   Amphetamines POSITIVE (*)    All other components within normal limits  ETHANOL  I-STAT BETA HCG BLOOD, ED ([facility], [facility], [facility] ONLY)    EKG None  Radiology No results found.  Procedures Procedures (including critical care time)  Medications Ordered in ED Medications - No data to display   Initial Impression / Assessment and Plan / ED Course  I have reviewed the triage vital signs and the nursing notes.  Pertinent labs & imaging results that were available during my care of the patient were reviewed by me and considered in my medical decision making (see  chart for details).    Patient will need TTS assessment to further evaluate her suicidal ideations and depression.  Final Clinical Impressions(s) / ED Diagnoses   Final diagnoses:  Borderline personality disorder (HCC)  Schizoaffective disorder, bipolar type [facility])    ED Discharge Orders         Ordered    Increase activity slowly     01/17/18 0938    Diet - low sodium heart healthy     01/17/18 0938    Discharge instructions    Comments:  Follow up with [facility]   01/17/18 0938           Charlestine Night, PA-C 01/19/18 1610    Alvira Monday, MD 01/21/18 [phone]

## 2018-01-26 ENCOUNTER — Inpatient Hospital Stay (HOSPITAL_COMMUNITY)
Admission: AD | Admit: 2018-01-26 | Discharge: 2018-01-31 | DRG: 885 | Disposition: A | Discharge status: Home / Self Care | Payer: Medicaid Other | Source: Intra-hospital | Attending: Psychiatry | Admitting: Psychiatry

## 2018-01-26 ENCOUNTER — Other Ambulatory Visit: Payer: Self-pay

## 2018-01-26 ENCOUNTER — Emergency Department (HOSPITAL_COMMUNITY)
Admission: EM | Admit: 2018-01-26 | Discharge: 2018-01-26 | Disposition: A | Discharge status: Other Acute Inpatient Hospital | Payer: Medicaid Other | Attending: Emergency Medicine | Admitting: Emergency Medicine

## 2018-01-26 ENCOUNTER — Encounter (HOSPITAL_COMMUNITY): Payer: Self-pay | Admitting: *Deleted

## 2018-01-26 ENCOUNTER — Encounter (HOSPITAL_COMMUNITY): Payer: Self-pay | Admitting: Emergency Medicine

## 2018-01-26 DIAGNOSIS — Z8249 Family history of ischemic heart disease and other diseases of the circulatory system: Secondary | ICD-10-CM

## 2018-01-26 DIAGNOSIS — F603 Borderline personality disorder: Secondary | ICD-10-CM | POA: Diagnosis present

## 2018-01-26 DIAGNOSIS — F909 Attention-deficit hyperactivity disorder, unspecified type: Secondary | ICD-10-CM | POA: Diagnosis present

## 2018-01-26 DIAGNOSIS — E569 Vitamin deficiency, unspecified: Secondary | ICD-10-CM | POA: Diagnosis present

## 2018-01-26 DIAGNOSIS — Z833 Family history of diabetes mellitus: Secondary | ICD-10-CM

## 2018-01-26 DIAGNOSIS — F251 Schizoaffective disorder, depressive type: Principal | ICD-10-CM | POA: Diagnosis present

## 2018-01-26 DIAGNOSIS — F840 Autistic disorder: Secondary | ICD-10-CM | POA: Diagnosis present

## 2018-01-26 DIAGNOSIS — Z886 Allergy status to analgesic agent status: Secondary | ICD-10-CM

## 2018-01-26 DIAGNOSIS — F4310 Post-traumatic stress disorder, unspecified: Secondary | ICD-10-CM | POA: Diagnosis present

## 2018-01-26 DIAGNOSIS — F515 Nightmare disorder: Secondary | ICD-10-CM | POA: Diagnosis not present

## 2018-01-26 DIAGNOSIS — F314 Bipolar disorder, current episode depressed, severe, without psychotic features: Secondary | ICD-10-CM | POA: Insufficient documentation

## 2018-01-26 DIAGNOSIS — M549 Dorsalgia, unspecified: Secondary | ICD-10-CM | POA: Diagnosis present

## 2018-01-26 DIAGNOSIS — R45851 Suicidal ideations: Secondary | ICD-10-CM | POA: Diagnosis present

## 2018-01-26 DIAGNOSIS — K219 Gastro-esophageal reflux disease without esophagitis: Secondary | ICD-10-CM | POA: Diagnosis present

## 2018-01-26 DIAGNOSIS — Z8349 Family history of other endocrine, nutritional and metabolic diseases: Secondary | ICD-10-CM | POA: Diagnosis not present

## 2018-01-26 DIAGNOSIS — G8929 Other chronic pain: Secondary | ICD-10-CM | POA: Diagnosis present

## 2018-01-26 DIAGNOSIS — Z8711 Personal history of peptic ulcer disease: Secondary | ICD-10-CM | POA: Diagnosis not present

## 2018-01-26 DIAGNOSIS — G4700 Insomnia, unspecified: Secondary | ICD-10-CM | POA: Diagnosis present

## 2018-01-26 DIAGNOSIS — Z888 Allergy status to other drugs, medicaments and biological substances status: Secondary | ICD-10-CM

## 2018-01-26 DIAGNOSIS — Z79899 Other long term (current) drug therapy: Secondary | ICD-10-CM | POA: Insufficient documentation

## 2018-01-26 LAB — URINALYSIS, ROUTINE W REFLEX MICROSCOPIC
Bilirubin Urine: NEGATIVE
GLUCOSE, UA: NEGATIVE mg/dL
HGB URINE DIPSTICK: NEGATIVE
Ketones, ur: NEGATIVE mg/dL
LEUKOCYTES UA: NEGATIVE
Nitrite: NEGATIVE
PROTEIN: NEGATIVE mg/dL
SPECIFIC GRAVITY, URINE: 1.02 (ref 1.005–1.030)
pH: 5 (ref 5.0–8.0)

## 2018-01-26 LAB — RAPID URINE DRUG SCREEN, HOSP PERFORMED
AMPHETAMINES: POSITIVE — AB
BENZODIAZEPINES: NOT DETECTED
Barbiturates: NOT DETECTED
Cocaine: NOT DETECTED
OPIATES: NOT DETECTED
TETRAHYDROCANNABINOL: NOT DETECTED

## 2018-01-26 LAB — COMPREHENSIVE METABOLIC PANEL
ALBUMIN: 3.5 g/dL (ref 3.5–5.0)
ALK PHOS: 119 U/L (ref 38–126)
ALT: 22 U/L (ref 0–44)
ANION GAP: 8 (ref 5–15)
AST: 19 U/L (ref 15–41)
BILIRUBIN TOTAL: 0.5 mg/dL (ref 0.3–1.2)
BUN: 8 mg/dL (ref 6–20)
CALCIUM: 9.3 mg/dL (ref 8.9–10.3)
CO2: 24 mmol/L (ref 22–32)
Chloride: 109 mmol/L (ref 98–111)
Creatinine, Ser: 0.92 mg/dL (ref 0.44–1.00)
GFR calc Af Amer: >60 mL/min (ref >60)
GFR calc non Af Amer: >60 mL/min (ref >60)
GLUCOSE: 96 mg/dL (ref 70–99)
Potassium: 3.9 mmol/L (ref 3.5–5.1)
SODIUM: 141 mmol/L (ref 135–145)
TOTAL PROTEIN: 6.8 g/dL (ref 6.5–8.1)

## 2018-01-26 LAB — SALICYLATE LEVEL

## 2018-01-26 LAB — I-STAT BETA HCG BLOOD, ED (MC, WL, AP ONLY): I-stat hCG, quantitative: <5 m[IU]/mL (ref <5)

## 2018-01-26 LAB — CBC
HCT: 40.6 % (ref 36.0–46.0)
HEMOGLOBIN: 13.1 g/dL (ref 12.0–15.0)
MCH: 29.4 pg (ref 26.0–34.0)
MCHC: 32.3 g/dL (ref 30.0–36.0)
MCV: 91 fL (ref 80.0–100.0)
NRBC: 0 % (ref 0.0–0.2)
PLATELETS: 371 10*3/uL (ref 150–400)
RBC: 4.46 MIL/uL (ref 3.87–5.11)
RDW: 13.2 % (ref 11.5–15.5)
WBC: 14.8 10*3/uL — AB (ref 4.0–10.5)

## 2018-01-26 LAB — ETHANOL: Alcohol, Ethyl (B): <10 mg/dL (ref <10)

## 2018-01-26 LAB — ACETAMINOPHEN LEVEL

## 2018-01-26 MED ORDER — MAGNESIUM HYDROXIDE 400 MG/5ML PO SUSP: 30.0000 mL | Freq: Every day | ORAL | Status: DC | PRN

## 2018-01-26 MED ORDER — ALUM & MAG HYDROXIDE-SIMETH 200-200-20 MG/5ML PO SUSP
30.0000 mL | ORAL | Status: DC | PRN
  Administered 2018-01-27 – 2018-01-28 (×2): 30 mL via ORAL
  Filled 2018-01-26 (×2): qty 30

## 2018-01-26 MED ORDER — PRAZOSIN HCL 2 MG PO CAPS
2.0000 mg | ORAL_CAPSULE | Freq: Every day | ORAL | Status: DC
  Administered 2018-01-26 – 2018-01-30 (×4): 2 mg via ORAL
  Filled 2018-01-26 (×5): qty 1
  Filled 2018-01-26: qty 2
  Filled 2018-01-26: qty 1

## 2018-01-26 MED ORDER — ACETAMINOPHEN 325 MG PO TABS
650.0000 mg | ORAL_TABLET | Freq: Four times a day (QID) | ORAL | Status: DC | PRN
  Administered 2018-01-26 – 2018-01-30 (×5): 650 mg via ORAL
  Filled 2018-01-26 (×5): qty 2

## 2018-01-26 MED ORDER — TRAZODONE HCL 100 MG PO TABS
100.0000 mg | ORAL_TABLET | Freq: Every day | ORAL | Status: DC
  Administered 2018-01-26 – 2018-01-30 (×4): 100 mg via ORAL
  Filled 2018-01-26 (×7): qty 1

## 2018-01-26 NOTE — Progress Notes (Signed)
Tamara [name] is a 22 year old female pt admitted on voluntary basis. On admission, she reports that she has been feeling depressed and suicidal and does endorse passive SI on admission but able to contract for safety on the unit. She reports that she was brought in to the hospital by her school counselor. She reports that she has been feeling depressed and suicidal for awhile now and spoke about how she has been thinking about taking overdose of medications. She reports that she see Dr. Gerome SamPoulus for medications and reports that she has been taking them as she should and reports that there have been some recent changes to her medicine as she feels they were not working for her. She denies any substance abuse issues. She reports that she lives in an apartment with 3 roommates and will return to the same living situation upon discharge. She was escorted to the unit, oriented to the milieu and safety maintained.

## 2018-01-26 NOTE — ED Notes (Signed)
Patient is SI and denies HI/AVH at this time. Plan of care discussed. Encouragement and support provided and safety maintain. Q 15 min safety checks in place and video monitoring.

## 2018-01-26 NOTE — ED Notes (Signed)
Per Debbe BalesBrook, RN patient can be transferred at 2100.

## 2018-01-26 NOTE — ED Notes (Signed)
Bed: WBH42 Expected date:  Expected time:  Means of arrival:  Comments: Hold for triage 3 

## 2018-01-26 NOTE — ED Notes (Signed)
Bed: WLPT3 Expected date:  Expected time:  Means of arrival:  Comments: 

## 2018-01-26 NOTE — ED Notes (Signed)
Bed: WLPT2 Expected date:  Expected time:  Means of arrival:  Comments: 

## 2018-01-26 NOTE — Tx Team (Signed)
Initial Treatment Plan 01/26/2018 10:15 PM Tamara MoritaQueenie Rhea [name] ZOX:[id number]    PATIENT STRESSORS: Educational concerns Medication change or noncompliance   PATIENT STRENGTHS: Ability for insight Average or above average intelligence General fund of knowledge Motivation for treatment/growth   PATIENT IDENTIFIED PROBLEMS: Depression Suicidal thoughts A/V hallucinations "Maybe an adjustment for my medications"                     DISCHARGE CRITERIA:  Ability to meet basic life and health needs Improved stabilization in mood, thinking, and/or behavior Reduction of life-threatening or endangering symptoms to within safe limits Verbal commitment to aftercare and medication compliance  PRELIMINARY DISCHARGE PLAN: Attend aftercare/continuing care group Return to previous living arrangement  PATIENT/FAMILY INVOLVEMENT: This treatment plan has been presented to and reviewed with the patient, Tamara [name], and/or family member, .  The patient and family have been given the opportunity to ask questions and make suggestions.  Kymberlie Brazeau, [place], [place]RN 01/26/2018, 10:15 PM

## 2018-01-26 NOTE — ED Notes (Signed)
Patient alert and oriented upon arrival to the unit.  Patient calm and cooperative asking for something to eat.  Patient reports she has been suicidal since beginning of school year, but has been trying to suppress it.  She called her doctor and had her medications adjusted, but that has not helped.  Patient has a plan for suicide but did not feel comfortable sharing it with nurse saying it was embarrassing.  Patient signed a release of information form which is on her chart.  TTS in patient's room interviewing patient now.

## 2018-01-26 NOTE — ED Notes (Signed)
ED physician at bedside.

## 2018-01-26 NOTE — ED Notes (Signed)
[facility]

## 2018-01-26 NOTE — Progress Notes (Addendum)
Report given by Debbe BalesBrook, RN. Pt is oriented to the unit. Pt was given snacks/fluids. Provider on call notified; Pt restarted back on night meds. Rates pain 8/10; lower back. PRN tylenol requested and given. Heat pack offered and accepted. Cooperative on the unit. Pt prefers to be call using "gender fluid" pronouns such as them and they.Support and encouragement offered. Will continue with POC.

## 2018-01-26 NOTE — ED Notes (Signed)
[facility]  [facility] to transport patient after shift change.

## 2018-01-26 NOTE — ED Notes (Signed)
Patient requesting fluid gender room when she is admitted.  Attempted to call Mardella LaymanLindsey but no answer.

## 2018-01-26 NOTE — ED Triage Notes (Addendum)
Patient BIB Field se[employer]ologist[employer] counselor. Reports SI "for a long time" with plan to OD on Advil. Hx of same. Reports "I always have hallucinations." Reports taking psych meds as prescribed. Cooperative in triage.

## 2018-01-26 NOTE — ED Provider Notes (Addendum)
[facility]-EMERGENCY DEPT Provider Note   CSN: [id number] Arrival date & time: 01/26/18  1414     History   Chief Complaint Chief Complaint  Patient presents with  . Suicidal    HPI Tamara [name] is a 22 y.o. female.  22 year old female with prior medical history as detailed below presents for evaluation of reported suicidal ideation.  The patient endorses suicidal ideation.  She is evasive when questioned as to whether she has a specific plan. She is without other acute complaint. Of note, patient with prior recent evaluations for similar symptoms in the past.   The history is provided by the patient and medical records.  Mental Health Problem  Presenting symptoms: suicidal thoughts   Degree of incapacity (severity):  Mild Onset quality:  Unable to specify Duration: unclear  Timing:  Constant Progression:  Worsening Treatment compliance:  Untreated Relieved by:  Nothing Worsened by:  Nothing   Past Medical History:  Diagnosis Date  . ADHD   . Autism   . Bipolar disorder (HCC)   . Chronic back pain    "mostly lower; sometimes all over" (03/22/2017)  . Chronic stomach ulcer   . Depression   . GERD (gastroesophageal reflux disease)   . Headache    "a few/week" (03/22/2017)  . Migraine    "monthly recently" (03/22/2017)  . Personality disorder (HCC)   . PTSD (post-traumatic stress disorder)   . Seizures (HCC)    "very random; I lose memory of what happens; usually happens when I'm in bed; probably 2-3/year" (03/22/2017)    Patient Active Problem List   Diagnosis Date Noted  . Borderline personality disorder (HCC) 12/20/2016  . Schizoaffective disorder (HCC) 12/19/2016    History reviewed. No pertinent surgical history.   OB History   None      Home Medications    Prior to Admission medications   Medication Sig Start Date End Date Taking? Authorizing Provider  amphetamine-dextroamphetamine (ADDERALL) 30 MG tablet Take 30  mg by mouth 2 (two) times daily.   Yes [provider]  atorvastatin (LIPITOR) 20 MG tablet Take 20 mg by mouth daily.   Yes [provider]  Cholecalciferol (VITAMIN D PO) Take 5,000 Units by mouth daily.   Yes [provider]  ibuprofen (ADVIL,MOTRIN) 200 MG tablet Take 400 mg by mouth daily as needed for moderate pain.    Yes [provider]  Lurasidone HCl 120 MG TABS Take 120 mg by mouth daily with breakfast.    Yes [provider]  pantoprazole (PROTONIX) 40 MG tablet Take 1 tablet (40 mg total) by mouth daily. For acid reflux 12/23/16  Yes Nwoko, Nicole Kindred I, NP  prazosin (MINIPRESS) 2 MG capsule Take 2 mg by mouth at bedtime. 01/12/18  Yes [provider]  traZODone (DESYREL) 100 MG tablet Take 100 mg by mouth at bedtime. 01/12/18  Yes [provider]  vortioxetine HBr (TRINTELLIX) 10 MG TABS tablet Take 10 mg by mouth daily.   Yes [provider]    Family History Family History  Problem Relation Age of Onset  . Diabetes Mother   . Hypertension Maternal Grandmother   . Hyperlipidemia Maternal Grandmother   . Diabetes Maternal Grandmother     Social History Social History   Tobacco Use  . Smoking status: Never Smoker  . Smokeless tobacco: Never Used  Substance Use Topics  . Alcohol use: No    Alcohol/week: 0.0 standard drinks  . Drug use: No  Allergies   Asa [aspirin] and Lithium   Review of Systems Review of Systems  Psychiatric/Behavioral: Positive for suicidal ideas.  All other systems reviewed and are negative.    Physical Exam Updated Vital Signs BP (!) 130/92 (BP Location: Right Arm)   Pulse (!) 114   Temp 98 F (36.7 C) (Oral)   Resp 20   SpO2 99%   Physical Exam  Constitutional: She is oriented to person, place, and time. She appears well-developed and well-nourished. No distress.  HENT:  Head: Normocephalic and atraumatic.  Mouth/Throat: Oropharynx is clear and moist.  Eyes:  Pupils are equal, round, and reactive to light. Conjunctivae and EOM are normal.  Neck: Normal range of motion. Neck supple.  Cardiovascular: Normal rate, regular rhythm and normal heart sounds.  Pulmonary/Chest: Effort normal and breath sounds normal. No respiratory distress.  Abdominal: Soft. She exhibits no distension. There is no tenderness.  Musculoskeletal: Normal range of motion. She exhibits no edema or deformity.  Neurological: She is alert and oriented to person, place, and time.  Skin: Skin is warm and dry.  Psychiatric: She has a normal mood and affect.  Endorses SI  Will not discuss possible plan of self harm with this provider.   Nursing note and vitals reviewed.    ED Treatments / Results  Labs (all labs ordered are listed, but only abnormal results are displayed) Labs Reviewed  ACETAMINOPHEN LEVEL - Abnormal; Notable for the following components:      Result Value   Acetaminophen (Tylenol), Serum <10 (*)    All other components within normal limits  CBC - Abnormal; Notable for the following components:   WBC 14.8 (*)    All other components within normal limits  RAPID URINE DRUG SCREEN, HOSP PERFORMED - Abnormal; Notable for the following components:   Amphetamines POSITIVE (*)    All other components within normal limits  COMPREHENSIVE METABOLIC PANEL  ETHANOL  SALICYLATE LEVEL  URINALYSIS, ROUTINE W REFLEX MICROSCOPIC  I-STAT BETA HCG BLOOD, ED ([facility], [facility], [facility] ONLY)    EKG None  Radiology No results found.  Procedures Procedures (including critical care time)  Medications Ordered in ED Medications - No data to display   Initial Impression / Assessment and Plan / ED Course  I have reviewed the triage vital signs and the nursing notes.  Pertinent labs & imaging results that were available during my care of the patient were reviewed by me and considered in my medical decision making (see chart for details).     MDM  Screen complete  Patient is  presenting for evaluation of reported Suicidal Ideation.   Patient without acute medical complaint.   Screening labs do not suggest acute medical pathology.  At this time she is clear for further psychiatric evaluation and treatment.     Final Clinical Impressions(s) / ED Diagnoses   Final diagnoses:  Suicidal ideation    ED Discharge Orders    None       Wynetta FinesMessick, Peter C, MD 01/26/18 1843    Wynetta FinesMessick, Peter C, MD 01/26/18 [phone]

## 2018-01-26 NOTE — ED Notes (Signed)
Patient changed into paper scrubs and wanded by security. 
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
## 2018-01-26 NOTE — ED Notes (Signed)
Pt transported to [facility] by [facility] service for continuation of specialized care. Belongings given to driver after patient signed for them. Pt left in no acute distress. 

## 2018-01-27 DIAGNOSIS — F251 Schizoaffective disorder, depressive type: Principal | ICD-10-CM

## 2018-01-27 DIAGNOSIS — R45851 Suicidal ideations: Secondary | ICD-10-CM

## 2018-01-27 DIAGNOSIS — F4310 Post-traumatic stress disorder, unspecified: Secondary | ICD-10-CM

## 2018-01-27 DIAGNOSIS — K219 Gastro-esophageal reflux disease without esophagitis: Secondary | ICD-10-CM

## 2018-01-27 DIAGNOSIS — G4700 Insomnia, unspecified: Secondary | ICD-10-CM

## 2018-01-27 LAB — LIPID PANEL
CHOL/HDL RATIO: 3.4 ratio
Cholesterol: 137 mg/dL (ref 0–200)
HDL: 40 mg/dL — AB (ref >40)
LDL Cholesterol: 68 mg/dL (ref 0–99)
Triglycerides: 147 mg/dL (ref <150)
VLDL: 29 mg/dL (ref 0–40)

## 2018-01-27 LAB — HEMOGLOBIN A1C
Hgb A1c MFr Bld: 5.2 % (ref 4.8–5.6)
Mean Plasma Glucose: 102.54 mg/dL

## 2018-01-27 LAB — TSH: TSH: 7.934 u[IU]/mL — AB (ref 0.350–4.500)

## 2018-01-27 MED ORDER — FLUOXETINE HCL 20 MG PO CAPS
20.0000 mg | ORAL_CAPSULE | Freq: Every day | ORAL | Status: DC
  Administered 2018-01-27 – 2018-01-31 (×5): 20 mg via ORAL
  Filled 2018-01-27 (×7): qty 1

## 2018-01-27 MED ORDER — ARIPIPRAZOLE 5 MG PO TABS
5.0000 mg | ORAL_TABLET | Freq: Every day | ORAL | Status: DC
  Administered 2018-01-27 – 2018-01-31 (×5): 5 mg via ORAL
  Filled 2018-01-27 (×8): qty 1

## 2018-01-27 MED ORDER — TRAZODONE HCL 100 MG PO TABS
100.0000 mg | ORAL_TABLET | Freq: Every day | ORAL | Status: DC
  Filled 2018-01-27 (×2): qty 1

## 2018-01-27 MED ORDER — PRENATAL MULTIVITAMIN CH
1.0000 | ORAL_TABLET | Freq: Every day | ORAL | Status: DC
  Administered 2018-01-27 – 2018-01-30 (×4): 1 via ORAL
  Filled 2018-01-27 (×5): qty 1
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
## 2018-01-27 NOTE — [facility] Suicide Risk Assessment (Signed)
[facility] INPATIENT:  Family/Significant Other Suicide Prevention Education  Suicide Prevention Education:  Patient Refusal for Family/Significant Other Suicide Prevention Education: The patient Tamara [name] has refused to provide written consent for family/significant other to be provided Family/Significant Other Suicide Prevention Education during admission and/or prior to discharge.  Physician notified.  Cherie Bohaboy 01/27/2018, 1:01 PM
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
## 2018-01-27 NOTE — [facility] Group Notes (Signed)
Pt did not attend wrap up group this evening. Pt stayed in their room instead 

## 2018-01-27 NOTE — Progress Notes (Signed)
Recreation Therapy Notes  Date: 11.21.19 Time: 1000 Location: 500 Hall Dayroom   Group Topic: Communication, Team Building, Problem Solving  Goal Area(s) Addresses:  Patient will effectively work with peer towards shared goal.  Patient will identify skill used to make activity successful.  Patient will identify how skills used during activity can be used to reach post d/c goals.   Intervention: STEM Activity   Activity: [place]. Patients were provided the following materials: 5 drinking straws, 5 rubber bands, 5 paper clips, 2 index cards, 2 drinking cups, and 2 toilet paper rolls. Using the provided materials patients were asked to build a launching mechanisms to launch a ping pong ball approximately 12 feet. Patients were divided into teams of 3-5.   Education: Pharmacist, communityocial Skills, Building control surveyorDischarge Planning.   Education Outcome: Acknowledges education/In group clarification offered/Needs additional education.   Clinical Observations/Feedback: Pt did not attend group.     Caroll RancherMarjette Jahzaria Vary, LRT/CTRS         Caroll RancherLindsay, Chantay Whitelock A 01/27/2018 11:32 AM

## 2018-01-27 NOTE — Plan of Care (Signed)
Progress note  D: pt found in the dayroom; compliant with medication administration. Pt states she slept well. Pt rates her depression/hopelessness/anxiety a 06/16/08 out of 10 respectively. Pt has complaints of lower back pain that she rates at a 7/10. Pt states her goal for today is to heal and she will achieve this by coping. Pt denies any si/hi/ah/vh to this Clinical research associatewriter and verbally agrees to approach staff if these become apparent or before harming herself [facility].  A: pt provided support and encouragement. Pt given medication per protocol and standing orders. Q9928m safety checks implemented and continued. R: pt safe on the unit. Will continue to monitor.   Pt progressing in the following metrics  Problem: Education: Goal: Knowledge of [facility] Education information/materials will improve Outcome: Progressing Goal: Emotional status will improve Outcome: Progressing Goal: Mental status will improve Outcome: Progressing Goal: Verbalization of understanding the information provided will improve Outcome: Progressing

## 2018-01-28 DIAGNOSIS — F515 Nightmare disorder: Secondary | ICD-10-CM

## 2018-01-28 LAB — PROLACTIN: Prolactin: 94.3 ng/mL — ABNORMAL HIGH (ref 4.8–23.3)

## 2018-01-28 MED ORDER — PANTOPRAZOLE SODIUM 40 MG PO TBEC
40.0000 mg | DELAYED_RELEASE_TABLET | Freq: Every day | ORAL | Status: DC
  Administered 2018-01-28 – 2018-01-31 (×4): 40 mg via ORAL
  Filled 2018-01-28 (×5): qty 1

## 2018-01-28 NOTE — Progress Notes (Signed)
[facility] MD Progress Note  01/28/2018 11:36 AM Tamara [name]  MRN:  [id number]  Subjective: Tamara [name] reports, "I came to the hospital because I was having suicidal thoughts. I'm still having the suicidal thoughts. But, I feel safe here. I'm also having heart burn & back pain. The depression is still there, but not as bad. But, my anxiety remains very high. I could not go to the group sessions this morning because I/m feeling drowsy from my medicines".  This 22 year old patient was admitted voluntarily, presenting with a cluster of complaints to include suicidal thinking, she states she does not want to elaborate the plan because it is "awkward" she also reports hearing negative voices inside her head and at times seeing shadows.  She has obviously been under psychiatric care she takes prazosin at night which implies nightmares but she does not want to discuss past trauma she states her family "are all jerks" and that is why she is so depressed. Her outpatient medications include Latuda, Trintellix, and Adderall.  Her drug screens have shown amphetamines from the Adderall.  She reports cyber bullying and some unusual statements were made online towards her she reports continued depressive symptoms despite treatment however she acknowledges her compliance is intermittent. Patient also focused on gender identity issues. Patient can contract for safety here.  Received her physical exam already.  Tamara [name] is seen, chart reviewed. She is lying down in her bed in her room. She presents alert & oriented. She is aware of situation. She says she came to the hospital because she was depressed & having suicidal thoughts. She blamed her symptoms on the disagreement she has with her family. She continues to endorse depression & high anxiety levels today. She rates her depression #6 & anxiety #9. She is also complaining of drowsiness which she blamed on the side effects of her medications. She says she could not go the  morning group today because her back hurts & also feeling tired from the drowsy feelings. She complains of heartburn & back pain. She is encouraged to ask her nurse for something for pain. She  was reminded that she does have an order for Protonix for the heart burn. She denies any HI, AVH, delusional thoughts or paranoia. She does not appear to be responding to any internal stimuli. Tamara [name] is in agreement to continue her current plan of care as already in progress & give her body time to adjust to the medications..   Principal Problem: Schizoaffective disorder, depressive type (HCC)  Diagnosis: Principal Problem:   Schizoaffective disorder, depressive type (HCC)  Total Time spent with patient: 30 minutes  Past Psychiatric History: See H&P  Past Medical History:  Past Medical History:  Diagnosis Date  . ADHD   . Autism   . Bipolar disorder (HCC)   . Chronic back pain    "mostly lower; sometimes all over" (03/22/2017)  . Chronic stomach ulcer   . Depression   . GERD (gastroesophageal reflux disease)   . Headache    "a few/week" (03/22/2017)  . Migraine    "monthly recently" (03/22/2017)  . Personality disorder (HCC)   . PTSD (post-traumatic stress disorder)   . Seizures (HCC)    "very random; I lose memory of what happens; usually happens when I'm in bed; probably 2-3/year" (03/22/2017)   History reviewed. No pertinent surgical history.  Family History:  Family History  Problem Relation Age of Onset  . Diabetes Mother   . Hypertension Maternal Grandmother   . Hyperlipidemia Maternal  Grandmother   . Diabetes Maternal Grandmother    Family Psychiatric  History: See H&P  Social History:  Social History   Substance and Sexual Activity  Alcohol Use No  . Alcohol/week: 0.0 standard drinks     Social History   Substance and Sexual Activity  Drug Use No    Social History   Socioeconomic History  . Marital status: Single    Spouse name: Not on file  . Number of children:  Not on file  . Years of education: Not on file  . Highest education level: Not on file  Occupational History  . Not on file  Social Needs  . Financial resource strain: Not on file  . Food insecurity:    Worry: Not on file    Inability: Not on file  . Transportation needs:    Medical: Not on file    Non-medical: Not on file  Tobacco Use  . Smoking status: Never Smoker  . Smokeless tobacco: Never Used  Substance and Sexual Activity  . Alcohol use: No    Alcohol/week: 0.0 standard drinks  . Drug use: No  . Sexual activity: Yes  Lifestyle  . Physical activity:    Days per week: Not on file    Minutes per session: Not on file  . Stress: Not on file  Relationships  . Social connections:    Talks on phone: Not on file    Gets together: Not on file    Attends religious service: Not on file    Active member of club or organization: Not on file    Attends meetings of clubs or organizations: Not on file    Relationship status: Not on file  Other Topics Concern  . Not on file  Social History Narrative  . Not on file   Additional Social History:   Sleep: Good  Appetite:  Good  Current Medications: Current Facility-Administered Medications  Medication Dose Route Frequency Provider Last Rate Last Dose  . acetaminophen (TYLENOL) tablet 650 mg  650 mg Oral Q6H PRN Nira Conn A, NP   650 mg at 01/27/18 1823  . alum & mag hydroxide-simeth (MAALOX/MYLANTA) 200-200-20 MG/5ML suspension 30 mL  30 mL Oral Q4H PRN Nira Conn A, NP   30 mL at 01/28/18 1007  . ARIPiprazole (ABILIFY) tablet 5 mg  5 mg Oral Daily Malvin Johns, MD   5 mg at 01/28/18 0742  . FLUoxetine (PROZAC) capsule 20 mg  20 mg Oral Daily Malvin Johns, MD   20 mg at 01/28/18 0742  . magnesium hydroxide (MILK OF MAGNESIA) suspension 30 mL  30 mL Oral Daily PRN Nira Conn A, NP      . pantoprazole (PROTONIX) EC tablet 40 mg  40 mg Oral Daily Money, Travis B, FNP   40 mg at 01/28/18 1126  . prazosin (MINIPRESS) capsule  2 mg  2 mg Oral QHS Kerry Hough, PA-C   2 mg at 01/27/18 2110  . prenatal multivitamin tablet 1 tablet  1 tablet Oral Q1200 Malvin Johns, MD   1 tablet at 01/28/18 1126  . traZODone (DESYREL) tablet 100 mg  100 mg Oral QHS Kerry Hough, PA-C   100 mg at 01/27/18 2110   Lab Results:  Results for orders placed or performed during the hospital encounter of 01/26/18 (from the past 48 hour(s))  Hemoglobin A1c     Status: None   Collection Time: 01/27/18  6:24 AM  Result Value Ref Range   Hgb  A1c MFr Bld 5.2 4.8 - 5.6 %    Comment: (NOTE) Pre diabetes:          5.7%-6.4% Diabetes:              >6.4% Glycemic control for   <7.0% adults with diabetes    Mean Plasma Glucose 102.54 mg/dL    Comment: Performed at [facility], [address] [place], [place] [address]  Lipid panel     Status: Abnormal   Collection Time: 01/27/18  6:24 AM  Result Value Ref Range   Cholesterol 137 0 - 200 mg/dL   Triglycerides 604147 <540<150 mg/dL   HDL 40 (L) >98>40 mg/dL   Total CHOL/HDL Ratio 3.4 RATIO   VLDL 29 0 - 40 mg/dL   LDL Cholesterol 68 0 - 99 mg/dL    Comment:        Total Cholesterol/HDL:CHD Risk Coronary Heart Disease Risk Table                     Men   Women  1/2 Average Risk   3.4   3.3  Average Risk       5.0   4.4  2 X Average Risk   9.6   7.1  3 X Average Risk  23.4   11.0        Use the calculated Patient Ratio above and the CHD Risk Table to determine the patient's CHD Risk.        ATP III CLASSIFICATION (LDL):  <100     mg/dL   Optimal  119-147100-129  mg/dL   Near or Above                    Optimal  130-159  mg/dL   Borderline  [id number]  mg/dL   High  >130>190     mg/dL   Very High Performed at [facility], [address] [place], [place] [address]   Prolactin     Status: Abnormal   Collection Time: 01/27/18  6:24 AM  Result Value Ref Range   Prolactin 94.3 (H) 4.8 - 23.3 ng/mL    Comment: (NOTE) Performed At: [facility] [address]  [place], [place] [id number] Jolene SchimkeNagendra Sanjai MD UX:[phone]   TSH     Status: Abnormal   Collection Time: 01/27/18  6:24 AM  Result Value Ref Range   TSH 7.934 (H) 0.350 - 4.500 uIU/mL    Comment: Performed by a 3rd Generation assay with a functional sensitivity of <=0.01 uIU/mL. Performed at [facility], [address] [place], [place] [address]    Blood Alcohol level:  Lab Results  Component Value Date   [facility] <10 01/26/2018   ETH <10 01/17/2018   Metabolic Disorder Labs: Lab Results  Component Value Date   HGBA1C 5.2 01/27/2018   MPG 102.54 01/27/2018   Lab Results  Component Value Date   PROLACTIN 94.3 (H) 01/27/2018   Lab Results  Component Value Date   CHOL 137 01/27/2018   TRIG 147 01/27/2018   HDL 40 (L) 01/27/2018   CHOLHDL 3.4 01/27/2018   VLDL 29 01/27/2018   LDLCALC 68 01/27/2018   Physical Findings: AIMS: Facial and Oral Movements Muscles of Facial Expression: None, normal Lips and Perioral Area: None, normal Jaw: None, normal Tongue: None, normal,Extremity Movements Upper (arms, wrists, hands, fingers): None, normal Lower (legs, knees, ankles, toes): None, normal, Trunk Movements Neck, shoulders, hips: None, normal, Overall  Severity Severity of abnormal movements (highest score from questions above): None, normal Incapacitation due to abnormal movements: None, normal Patient's awareness of abnormal movements (rate only patient's report): No Awareness, Dental Status Current problems with teeth and/or dentures?: No Does patient usually wear dentures?: No  CIWA:    COWS:     Musculoskeletal: Strength & Muscle Tone: within normal limits Gait & Station: normal Patient leans: N/A  Psychiatric Specialty Exam: Physical Exam  Nursing note and vitals reviewed.   ROS  Blood pressure 128/82, pulse 93, temperature (!) 97.4 F (36.3 C), temperature source Oral, resp. rate 20, height 5\' 2"  (1.575 m), weight 118.8 kg, SpO2 99 %.Body mass  index is 47.92 kg/m.  General Appearance: casual  Eye Contact:  Fair  Speech:  Clear and Coherent  Volume:  Normal  Mood:  Dysphoric  Affect:  Depressed  Thought Process:  Coherent  Orientation:  Full (Time, Place, and Person)  Thought Content:  Tangential  Suicidal Thoughts:  Yes.  with intent/plan  Homicidal Thoughts:  No  Memory:  Immediate;   Fair  Judgement:  Intact  Insight:  Fair  Psychomotor Activity:  Normal  Concentration:  Concentration: Good  Recall:  Good  Fund of Knowledge:  Good  Language:  Good  Akathisia:  Negative  Handed:  Right  AIMS (if indicated):     Assets:  Communication Skills  ADL's:  Intact  Cognition:  WNL  Sleep:  Number of Hours: 6.75     Treatment Plan Summary: Daily contact with patient to assess and evaluate symptoms and progress in treatment.  - Continue inpatient hospitalization.  - Will continue today 01/28/2018 plan as below except where it is noted.  Mood control.     - Continue Abilify 5 mg po daily.  Depression.     - Continue Prozac 20 mg daily.  PTSD/Nightmares.     - Continue Minipress 2 mg po Q hs.  Insomnia.      - Continue Trazodone 100 mg po Q hs.  GERD.    - Continue Protonix 40 mg po daily Q am.  Patient to continue to attend & participate in the group sessions.  Discharge disposition is ongoing.  Armandina Stammer, NP, pmhnp, fnp-bc 01/28/2018, 11:36 AM

## 2018-01-28 NOTE — Tx Team (Signed)
Interdisciplinary Treatment and Diagnostic Plan Update  01/28/2018 Time of Session: 1342 Tamara [name] MRN: [id number]  Principal Diagnosis: Schizoaffective disorder, depressive type [facility])  Secondary Diagnoses: Principal Problem:   Schizoaffective disorder, depressive type (HCC)   Current Medications:  Current Facility-Administered Medications  Medication Dose Route Frequency Provider Last Rate Last Dose  . acetaminophen (TYLENOL) tablet 650 mg  650 mg Oral Q6H PRN Nira Conn A, NP   650 mg at 01/27/18 1823  . alum & mag hydroxide-simeth (MAALOX/MYLANTA) 200-200-20 MG/5ML suspension 30 mL  30 mL Oral Q4H PRN Nira Conn A, NP   30 mL at 01/28/18 1007  . ARIPiprazole (ABILIFY) tablet 5 mg  5 mg Oral Daily Malvin Johns, MD   5 mg at 01/28/18 0742  . FLUoxetine (PROZAC) capsule 20 mg  20 mg Oral Daily Malvin Johns, MD   20 mg at 01/28/18 0742  . magnesium hydroxide (MILK OF MAGNESIA) suspension 30 mL  30 mL Oral Daily PRN Nira Conn A, NP      . pantoprazole (PROTONIX) EC tablet 40 mg  40 mg Oral Daily Money, Travis B, FNP   40 mg at 01/28/18 1126  . prazosin (MINIPRESS) capsule 2 mg  2 mg Oral QHS Kerry Hough, PA-C   2 mg at 01/27/18 2110  . prenatal multivitamin tablet 1 tablet  1 tablet Oral Q1200 Malvin Johns, MD   1 tablet at 01/28/18 1126  . traZODone (DESYREL) tablet 100 mg  100 mg Oral QHS Kerry Hough, PA-C   100 mg at 01/27/18 2110   PTA Medications: Medications Prior to Admission  Medication Sig Dispense Refill Last Dose  . amphetamine-dextroamphetamine (ADDERALL) 30 MG tablet Take 30 mg by mouth 2 (two) times daily.   01/25/2018 at Unknown time  . atorvastatin (LIPITOR) 20 MG tablet Take 20 mg by mouth daily.   01/25/2018 at Unknown time  . Cholecalciferol (VITAMIN D PO) Take 5,000 Units by mouth daily.   01/25/2018 at Unknown time  . ibuprofen (ADVIL,MOTRIN) 200 MG tablet Take 400 mg by mouth daily as needed for moderate pain.    01/25/2018 at Unknown  time  . Lurasidone HCl 120 MG TABS Take 120 mg by mouth daily with breakfast.    01/25/2018 at Unknown time  . pantoprazole (PROTONIX) 40 MG tablet Take 1 tablet (40 mg total) by mouth daily. For acid reflux   01/25/2018 at Unknown time  . prazosin (MINIPRESS) 2 MG capsule Take 2 mg by mouth at bedtime.  1 01/25/2018 at Unknown time  . traZODone (DESYREL) 100 MG tablet Take 100 mg by mouth at bedtime.  1 01/25/2018 at Unknown time  . vortioxetine HBr (TRINTELLIX) 10 MG TABS tablet Take 10 mg by mouth daily.   01/25/2018 at Unknown time    Patient Stressors: Educational concerns Medication change or noncompliance  Patient Strengths: Ability for insight Average or above average intelligence General fund of knowledge Motivation for treatment/growth  Treatment Modalities: Medication Management, Group therapy, Case management,  1 to 1 session with clinician, Psychoeducation, Recreational therapy.   Physician Treatment Plan for Primary Diagnosis: Schizoaffective disorder, depressive type (HCC) Long Term Goal(s): Improvement in symptoms so as ready for discharge Improvement in symptoms so as ready for discharge   Short Term Goals: Ability to identify changes in lifestyle to reduce recurrence of condition will improve Ability to verbalize feelings will improve Ability to disclose and discuss suicidal ideas Ability to demonstrate self-control will improve Ability to identify and develop effective coping  behaviors will improve Ability to identify changes in lifestyle to reduce recurrence of condition will improve Ability to verbalize feelings will improve Ability to disclose and discuss suicidal ideas Ability to identify and develop effective coping behaviors will improve Ability to maintain clinical measurements within normal limits will improve  Medication Management: Evaluate patient's response, side effects, and tolerance of medication regimen.  Therapeutic Interventions: 1 to 1  sessions, Unit Group sessions and Medication administration.  Evaluation of Outcomes: Progressing  Physician Treatment Plan for Secondary Diagnosis: Principal Problem:   Schizoaffective disorder, depressive type (HCC)  Long Term Goal(s): Improvement in symptoms so as ready for discharge Improvement in symptoms so as ready for discharge   Short Term Goals: Ability to identify changes in lifestyle to reduce recurrence of condition will improve Ability to verbalize feelings will improve Ability to disclose and discuss suicidal ideas Ability to demonstrate self-control will improve Ability to identify and develop effective coping behaviors will improve Ability to identify changes in lifestyle to reduce recurrence of condition will improve Ability to verbalize feelings will improve Ability to disclose and discuss suicidal ideas Ability to identify and develop effective coping behaviors will improve Ability to maintain clinical measurements within normal limits will improve     Medication Management: Evaluate patient's response, side effects, and tolerance of medication regimen.  Therapeutic Interventions: 1 to 1 sessions, Unit Group sessions and Medication administration.  Evaluation of Outcomes: Progressing   RN Treatment Plan for Primary Diagnosis: Schizoaffective disorder, depressive type (HCC) Long Term Goal(s): Knowledge of disease and therapeutic regimen to maintain health will improve  Short Term Goals: Ability to identify and develop effective coping behaviors will improve and Compliance with prescribed medications will improve  Medication Management: RN will administer medications as ordered by provider, will assess and evaluate patient's response and provide education to patient for prescribed medication. RN will report any adverse and/or side effects to prescribing provider.  Therapeutic Interventions: 1 on 1 counseling sessions, Psychoeducation, Medication administration,  Evaluate responses to treatment, Monitor vital signs and CBGs as ordered, Perform/monitor CIWA, COWS, AIMS and Fall Risk screenings as ordered, Perform wound care treatments as ordered.  Evaluation of Outcomes: Progressing   LCSW Treatment Plan for Primary Diagnosis: Schizoaffective disorder, depressive type (HCC) Long Term Goal(s): Safe transition to appropriate next level of care at discharge, Engage patient in therapeutic group addressing interpersonal concerns.  Short Term Goals: Engage patient in aftercare planning with referrals and resources, Increase social support and Increase skills for wellness and recovery  Therapeutic Interventions: Assess for all discharge needs, 1 to 1 time with Social worker, Explore available resources and support systems, Assess for adequacy in community support network, Educate family and significant other(s) on suicide prevention, Complete Psychosocial Assessment, Interpersonal group therapy.  Evaluation of Outcomes: Progressing   Progress in Treatment: Attending groups: No. Participating in groups: No. Taking medication as prescribed: Yes. Toleration medication: Yes. Family/Significant other contact made: No, will contact:  pt refused Patient understands diagnosis: Yes. Discussing patient identified problems/goals with staff: Yes. Medical problems stabilized or resolved: Yes. Denies suicidal/homicidal ideation: Yes. Issues/concerns per patient self-inventory: No. Other: none  New problem(s) identified: No, Describe:  none  New Short Term/Long Term Goal(s):  Patient Goals:  "to see if new medicines make me feel better"  Discharge Plan or Barriers:   Reason for Continuation of Hospitalization: Depression Medication stabilization  Estimated Length of Stay: 2-4 days.  Attendees: Patient: Tamara [name] 01/28/2018   Physician: Dr Jola Babinskilary, MD 01/28/2018   Nursing: Lanora ManisElizabeth  Awofadeju, RN 01/28/2018   RN Care Manager: 01/28/2018   Social  Worker: Daleen Squibb, LCSW 01/28/2018   Recreational Therapist:  01/28/2018   Other:  01/28/2018   Other:  01/28/2018   Other: 01/28/2018       Scribe for Treatment Team: Lorri Frederick, LCSW 01/28/2018 2:37 PM

## 2018-01-28 NOTE — [facility] Group Notes (Signed)
spiritual care group led by chaplain Burnis KingfisherMatthew Shantel Wesely, MDiv, [facility].    Group Description: Focused on Topic of Hope.  Pt's participated in facilitated dialog around meaning of hope in their lives.  Utilized Paramedicvisual explorer cards to share an image of hope for them over the next day.     Patient Participation:  Tamara [name] was invited.  Did not attend.

## 2018-01-28 NOTE — Plan of Care (Signed)
  Problem: Activity: Goal: Interest or engagement in activities will improve Outcome: Not Progressing   Problem: Safety: Goal: Periods of time without injury will increase Outcome: Progressing  DAR NOTE: Patient presents with anxious affect and depressed mood.  Endorses suicidal thoughts but verbally contracts for safety.  Continues to reports multiple  somatic symptoms.  Rates depression at 7, hopelessness at 7, and anxiety at 10.  Maintained on routine safety checks.  Medications given as prescribed.  Support and encouragement offered as needed.  Attended group and participated.  States goal for today is "being honest."  Patient remained in her room for most of this shift.

## 2018-01-28 NOTE — Progress Notes (Signed)
D: Patient endorses passive SI but contracts for safety and denies HI or AVH.   Patient presents as animated and anxious but pleasant and cooperative.  Pt. States that her day has been good and that she is eating/sleeping well.  Pt. Spoke about her education at [employer] and her family members with their lack of support.  Pt. C/o some heartburn which reports is her norm.    A: Patient given emotional support from RN. Patient encouraged to come to staff with concerns and/or questions. Patient's medication routine continued. Patient's orders and plan of care reviewed.   R: Patient remains appropriate and cooperative. Will continue to monitor patient q15 minutes for safety.

## 2018-01-28 NOTE — Progress Notes (Signed)
Recreation Therapy Notes  Date: 01/28/18 Time: 1000 Location: 500 Hall Dayroom  Group Topic: Stress Management  Goal Area(s) Addresses:  Patient will verbalize importance of using healthy stress management.  Patient will identify positive emotions associated with healthy stress management.   Behavioral Response: Engaged  Intervention: Stress Management  Activity :  Meditation.  LRT introduced the stress management technique of meditation to the patients.  LRT played a meditation that focused on healing and letting go.  Patients were to follow along as the meditation played to engage in the activity.  Education: Stress Management, Discharge Planning.   Education Outcome: Acknowledges edcuation/In group clarification offered/Needs additional education  Clinical Observations/Feedback: Pt was fully engaged in the meditation but left early because she stated her back was hurting.  Pt was appropriate for the time she was in group and able to focus.    Caroll RancherMarjette Eulan Heyward, LRT/CTRS     Caroll RancherLindsay, Ziquan Fidel A 01/28/2018 1:18 PM

## 2018-01-29 NOTE — Plan of Care (Signed)
D: Patient presents anxious, cooperative. She appears guarded, and seems uncomfortable. She denies physical complaints, however. She reports sleeping poorly last night, and waking often. She also complains of back pain (low, mid and upper). Patient denies SI/HI/AVH.  A: Patient checked q15 min, and checks reviewed. Reviewed medication changes with patient and educated on side effects. Educated patient on importance of attending group therapy sessions and educated on several coping skills. Encouarged participation in milieu through recreation therapy and attending meals with peers. Support and encouragement provided. Fluids offered. R: Patient receptive to education on medications, and is medication compliant. Patient contracts for safety on the unit. Patient refused to fill out self-inventory.

## 2018-01-29 NOTE — Progress Notes (Signed)
D: Patient endorses passive SI but contracts for safety.  Patient presents as animated and anxious but pleasant and appropriate.  Pt. Was briefly visualized in the milieu but retreated to her bed shortly after snack and went to sleep.  Pt. Continues  to report somatic symptoms but reports a good appetite and is no obvious discomfort.  She states that she slept poorly but was visualized sleeping with resps even and unlabored and no apparent distress.  A: Patient given emotional support from RN. Patient encouraged to come to staff with concerns and/or questions. Patient's medication routine continued. Patient's orders and plan of care reviewed.   R: Patient remains appropriate and cooperative. Will continue to monitor patient q15 minutes for safety.

## 2018-01-29 NOTE — Progress Notes (Signed)
[facility]  01/29/2018 11:42 AM [name]  MRN:  [id number]  Subjective: Tamara [name] reports, "I'm doing better, no severe symptoms of depression or anxiety today. I'm kind of sleeping well but I wake up a lot at night. I'm a little anxious about being here. It feels like I'm being punished or something because I can't use my phone, go to Youtube or listen to music. But, that is okay. I'm feeling a little drowsy today, that is all".  This 22 year old patient was admitted voluntarily, presenting with a cluster of complaints to include suicidal thinking, she states she does not want to elaborate the plan because it is "awkward" she also reports hearing negative voices inside her head and at times seeing shadows.  She has obviously been under psychiatric care she takes prazosin at night which implies nightmares but she does not want to discuss past trauma she states her family "are all jerks" and that is why she is so depressed. Her outpatient medications include Latuda, Trintellix, and Adderall.  Her drug screens have shown amphetamines from the Adderall.  She reports cyber bullying and some unusual statements were made online towards her she reports continued depressive symptoms despite treatment however she acknowledges her compliance is intermittent. Patient also focused on gender identity issues. Patient can contract for safety here.  Received her physical exam already.  Tamara [name] is seen, chart reviewed. She is lying down in her bed in her room. She presents alert & oriented. She is aware of situation. She denies severe symptoms of depression & anxiety today.However, says she is having some anxiety symptoms for being in the hospital. She says she feels like she is being punished here because she is not allowed to use her phone to play/listen to some music on Youtube. She rates her depression at #3 & anxiety #4. She is still complaining of drowsiness which she blamed on the side effects  of her medications. She does not appear to be in any distress during this evaluation. He affect is good. She is visible on the unit today  She denies any HI, AVH, delusional thoughts or paranoia. She does not appear to be responding to any internal stimuli. Tamara [name] is in agreement to continue her current plan of care as already in progress & give her body time to adjust to the medications..   Principal Problem: Schizoaffective disorder, depressive type (HCC)  Diagnosis: Principal Problem:   Schizoaffective disorder, depressive type (HCC)  Total Time spent with patient: 30 minutes  Past Psychiatric History: See H&P  Past Medical History:  Past Medical History:  Diagnosis Date  . ADHD   . Autism   . Bipolar disorder (HCC)   . Chronic back pain    "mostly lower; sometimes all over" (03/22/2017)  . Chronic stomach ulcer   . Depression   . GERD (gastroesophageal reflux disease)   . Headache    "a few/week" (03/22/2017)  . Migraine    "monthly recently" (03/22/2017)  . Personality disorder (HCC)   . PTSD (post-traumatic stress disorder)   . Seizures (HCC)    "very random; I lose memory of what happens; usually happens when I'm in bed; probably 2-3/year" (03/22/2017)   History reviewed. No pertinent surgical history.  Family History:  Family History  Problem Relation Age of Onset  . Diabetes Mother   . Hypertension Maternal Grandmother   . Hyperlipidemia Maternal Grandmother   . Diabetes Maternal Grandmother    Family Psychiatric  History: See H&P  Social  History:  Social History   Substance and Sexual Activity  Alcohol Use No  . Alcohol/week: 0.0 standard drinks     Social History   Substance and Sexual Activity  Drug Use No    Social History   Socioeconomic History  . Marital status: Single    Spouse name: Not on file  . Number of children: Not on file  . Years of education: Not on file  . Highest education level: Not on file  Occupational History  . Not on file   Social Needs  . Financial resource strain: Not on file  . Food insecurity:    Worry: Not on file    Inability: Not on file  . Transportation needs:    Medical: Not on file    Non-medical: Not on file  Tobacco Use  . Smoking status: Never Smoker  . Smokeless tobacco: Never Used  Substance and Sexual Activity  . Alcohol use: No    Alcohol/week: 0.0 standard drinks  . Drug use: No  . Sexual activity: Yes  Lifestyle  . Physical activity:    Days per week: Not on file    Minutes per session: Not on file  . Stress: Not on file  Relationships  . Social connections:    Talks on phone: Not on file    Gets together: Not on file    Attends religious service: Not on file    Active member of club or organization: Not on file    Attends meetings of clubs or organizations: Not on file    Relationship status: Not on file  Other Topics Concern  . Not on file  Social History Narrative  . Not on file   Additional Social History:   Sleep: Good  Appetite:  Good  Current Medications: Current Facility-Administered Medications  Medication Dose Route Frequency Provider Last Rate Last Dose  . acetaminophen (TYLENOL) tablet 650 mg  650 mg Oral Q6H PRN Nira Conn A, NP   650 mg at 01/27/18 1823  . alum & mag hydroxide-simeth (MAALOX/MYLANTA) 200-200-20 MG/5ML suspension 30 mL  30 mL Oral Q4H PRN Nira Conn A, NP   30 mL at 01/28/18 1007  . ARIPiprazole (ABILIFY) tablet 5 mg  5 mg Oral Daily Malvin Johns, MD   5 mg at 01/29/18 0818  . FLUoxetine (PROZAC) capsule 20 mg  20 mg Oral Daily Malvin Johns, MD   20 mg at 01/29/18 0818  . magnesium hydroxide (MILK OF MAGNESIA) suspension 30 mL  30 mL Oral Daily PRN Nira Conn A, NP      . pantoprazole (PROTONIX) EC tablet 40 mg  40 mg Oral Daily Money, Gerlene Burdock, FNP   40 mg at 01/29/18 0818  . prazosin (MINIPRESS) capsule 2 mg  2 mg Oral QHS Donell Sievert E, PA-C   2 mg at 01/27/18 2110  . prenatal multivitamin tablet 1 tablet  1 tablet Oral  Q1200 Malvin Johns, MD   1 tablet at 01/28/18 1126  . traZODone (DESYREL) tablet 100 mg  100 mg Oral QHS Kerry Hough, PA-C   100 mg at 01/27/18 2110   Lab Results:  No results found for this or any previous visit (from the past 48 hour(s)). Blood Alcohol level:  Lab Results  Component Value Date   [facility] <10 01/26/2018   ETH <10 01/17/2018   Metabolic Disorder Labs: Lab Results  Component Value Date   HGBA1C 5.2 01/27/2018   MPG 102.54 01/27/2018   Lab Results  Component Value Date   PROLACTIN 94.3 (H) 01/27/2018   Lab Results  Component Value Date   CHOL 137 01/27/2018   TRIG 147 01/27/2018   HDL 40 (L) 01/27/2018   CHOLHDL 3.4 01/27/2018   VLDL 29 01/27/2018   LDLCALC 68 01/27/2018   Physical Findings: AIMS: Facial and Oral Movements Muscles of Facial Expression: None, normal Lips and Perioral Area: None, normal Jaw: None, normal Tongue: None, normal,Extremity Movements Upper (arms, wrists, hands, fingers): None, normal Lower (legs, knees, ankles, toes): None, normal, Trunk Movements Neck, shoulders, hips: None, normal, Overall Severity Severity of abnormal movements (highest score from questions above): None, normal Incapacitation due to abnormal movements: None, normal Patient's awareness of abnormal movements (rate only patient's report): No Awareness, Dental Status Current problems with teeth and/or dentures?: No Does patient usually wear dentures?: No  CIWA:  CIWA-Ar Total: 0 COWS:  COWS Total Score: 0  Musculoskeletal: Strength & Muscle Tone: within normal limits Gait & Station: normal Patient leans: N/A  Psychiatric Specialty Exam: Physical Exam  Nursing note and vitals reviewed.   ROS  Blood pressure 115/83, pulse 96, temperature (!) 97.5 F (36.4 C), temperature source Oral, resp. rate 20, height 5\' 2"  (1.575 m), weight 118.8 kg, SpO2 99 %.Body mass index is 47.92 kg/m.  General Appearance: casual  Eye Contact:  Fair  Speech:  Clear and  Coherent  Volume:  Normal  Mood:  Dysphoric  Affect:  Depressed  Thought Process:  Coherent  Orientation:  Full (Time, Place, and Person)  Thought Content:  Tangential  Suicidal Thoughts:  Yes.  with intent/plan  Homicidal Thoughts:  No  Memory:  Immediate;   Fair  Judgement:  Intact  Insight:  Fair  Psychomotor Activity:  Normal  Concentration:  Concentration: Good  Recall:  Good  Fund of Knowledge:  Good  Language:  Good  Akathisia:  Negative  Handed:  Right  AIMS (if indicated):     Assets:  Communication Skills  ADL's:  Intact  Cognition:  WNL  Sleep:  Number of Hours: 6.75     Treatment Plan Summary: Daily contact with patient to assess and evaluate symptoms and progress in treatment.  - Continue inpatient hospitalization.  - Will continue today 01/29/2018 plan as below except where it is noted.  Mood control.     - Continue Abilify 5 mg po daily.  Depression.     - Continue Prozac 20 mg daily.  PTSD/Nightmares.     - Continue Minipress 2 mg po Q hs.  Insomnia.      - Continue Trazodone 100 mg po Q hs.  GERD.    - Continue Protonix 40 mg po daily Q am.  Patient to continue to attend & participate in the group sessions.  Discharge disposition is ongoing.  Armandina Stammer, NP, pmhnp, fnp-bc 01/29/2018, 11:42 AMPatient ID: Montez Morita, female   DOB: 1995-04-08, 22 y.o.   MRN: [id number]

## 2018-01-29 NOTE — Progress Notes (Signed)
Patient has been in her room resting the majority of the night. She had to be awakened for her hs medications. She received her snack and medication and returned to bed. Safety maintained on unit with 15 min checks.

## 2018-01-30 NOTE — Plan of Care (Signed)
Progress Note  D: pt found in the hallway, compliant with medication administration. Pt states she slept well. Pt rates her depression/hopelessness/anxiety a 5/4/6 out of 10 respectively. Pt states she is having back pain, rating this a 6/10. Pt stated her goal for today is to be happy and she will achieve this by being positive. Pt denies any si/hi/ah/vh and verbally agrees to approach staff if these become apparent or before [facility]. A: pt provided support and encouragement. Pt given medication per protocol and standing orders. [id number] safety checks implemented and continued. R: pt safe on the unit; will continue to monitor.  Pt progressing in the following metrics  Problem: Coping: Goal: Ability to verbalize frustrations and anger appropriately will improve Outcome: Progressing Goal: Ability to demonstrate self-control will improve Outcome: Progressing   Problem: Health Behavior/Discharge Planning: Goal: Compliance with treatment plan for underlying cause of condition will improve Outcome: Progressing   Problem: Physical Regulation: Goal: Ability to maintain clinical measurements within normal limits will improve Outcome: Progressing

## 2018-01-30 NOTE — [facility] Group Notes (Signed)
[facility]  Date/Time:  01/30/2018  11:00AM-12:00PM  Type of Therapy and Topic:  Group Therapy:  Music and Mood  Participation Level:  Active   Description of Group: In this process group, members listened to a variety of genres of music and identified that different types of music evoke different responses.  Patients were encouraged to identify music that was soothing for them and music that was energizing for them.  Patients discussed how this knowledge can help with wellness and recovery in various ways including managing depression and anxiety as well as encouraging healthy sleep habits.    Therapeutic Goals: 1. Patients will explore the impact of different varieties of music on mood 2. Patients will verbalize the thoughts they have when listening to different types of music 3. Patients will identify music that is soothing to them as well as music that is energizing to them 4. Patients will discuss how to use this knowledge to assist in maintaining wellness and recovery 5. Patients will explore the use of music as a coping skill  Summary of Patient Progress:  At the beginning of group, patient expressed that she felt tired and at the end of group said she felt energized.  Therapeutic Modalities: Solution Focused Brief Therapy Activity   Ambrose MantleMareida Grossman-Orr, LCSW

## 2018-01-30 NOTE — Progress Notes (Signed)
[facility] MD Progress Note  01/30/2018 11:15 AM Tamara [name]  MRN:  [id number]  Subjective: Tamara [name] reports, "I'm feeling really drowsy this morning. It started after I took my morning medicines. I could not go to breakfast as a result. The depression is not so bad today. But, I'm always anxious. I cannot rate or put a number on my anxiety because it is always there. I'm not feeling suicidal or homicidal, but, I did hear a voice once this morning. I am not sure if the voice is from the staff or something else. I did attend a relaxation group session yesterday. The activity itself made real sleepy. I did not like it much".  This 22 year old patient was admitted voluntarily, presenting with a cluster of complaints to include suicidal thinking, she states she does not want to elaborate the plan because it is "awkward" she also reports hearing negative voices inside her head and at times seeing shadows.  She has obviously been under psychiatric care she takes prazosin at night which implies nightmares but she does not want to discuss past trauma she states her family "are all jerks" and that is why she is so depressed. Her outpatient medications include Latuda, Trintellix, and Adderall.  Her drug screens have shown amphetamines from the Adderall.  She reports cyber bullying and some unusual statements were made online towards her she reports continued depressive symptoms despite treatment however she acknowledges her compliance is intermittent. Patient also focused on gender identity issues. Patient can contract for safety here.  Received her physical exam already.  Tamara [name] is seen, chart reviewed. She is lying down in her bed in her room. She presents alert & oriented. She is aware of situation. She denies severe symptoms of depression & continue to endorse symptoms of anxiety.However, says she is unable to rate her anxiety today because it is always there.  She rates her depression at #0. She is still  complaining of drowsiness which she blamed on the side effects of her medications. She does not appear to be in any distress during this evaluation. He affect is good. She is encouraged to be visible on the unit to help alleviate weakness/tiredness that comes with lying in bed all day. She denies any HI, AVH, delusional thoughts or paranoia. She does not appear to be responding to any internal stimuli. Tamara [name] is in agreement to continue her current plan of care as already in progress & give her body time to adjust to the medications..   Principal Problem: Schizoaffective disorder, depressive type (HCC)  Diagnosis: Principal Problem:   Schizoaffective disorder, depressive type (HCC)  Total Time spent with patient: 15 minutes  Past Psychiatric History: See H&P  Past Medical History:  Past Medical History:  Diagnosis Date  . ADHD   . Autism   . Bipolar disorder (HCC)   . Chronic back pain    "mostly lower; sometimes all over" (03/22/2017)  . Chronic stomach ulcer   . Depression   . GERD (gastroesophageal reflux disease)   . Headache    "a few/week" (03/22/2017)  . Migraine    "monthly recently" (03/22/2017)  . Personality disorder (HCC)   . PTSD (post-traumatic stress disorder)   . Seizures (HCC)    "very random; I lose memory of what happens; usually happens when I'm in bed; probably 2-3/year" (03/22/2017)   History reviewed. No pertinent surgical history.  Family History:  Family History  Problem Relation Age of Onset  . Diabetes Mother   . Hypertension Maternal  Grandmother   . Hyperlipidemia Maternal Grandmother   . Diabetes Maternal Grandmother    Family Psychiatric  History: See H&P  Social History:  Social History   Substance and Sexual Activity  Alcohol Use No  . Alcohol/week: 0.0 standard drinks     Social History   Substance and Sexual Activity  Drug Use No    Social History   Socioeconomic History  . Marital status: Single    Spouse name: Not on file  .  Number of children: Not on file  . Years of education: Not on file  . Highest education level: Not on file  Occupational History  . Not on file  Social Needs  . Financial resource strain: Not on file  . Food insecurity:    Worry: Not on file    Inability: Not on file  . Transportation needs:    Medical: Not on file    Non-medical: Not on file  Tobacco Use  . Smoking status: Never Smoker  . Smokeless tobacco: Never Used  Substance and Sexual Activity  . Alcohol use: No    Alcohol/week: 0.0 standard drinks  . Drug use: No  . Sexual activity: Yes  Lifestyle  . Physical activity:    Days per week: Not on file    Minutes per session: Not on file  . Stress: Not on file  Relationships  . Social connections:    Talks on phone: Not on file    Gets together: Not on file    Attends religious service: Not on file    Active member of club or organization: Not on file    Attends meetings of clubs or organizations: Not on file    Relationship status: Not on file  Other Topics Concern  . Not on file  Social History Narrative  . Not on file   Additional Social History:   Sleep: Good  Appetite:  Good  Current Medications: Current Facility-Administered Medications  Medication Dose Route Frequency Provider Last Rate Last Dose  . acetaminophen (TYLENOL) tablet 650 mg  650 mg Oral Q6H PRN Nira Conn A, NP   650 mg at 01/29/18 1855  . alum & mag hydroxide-simeth (MAALOX/MYLANTA) 200-200-20 MG/5ML suspension 30 mL  30 mL Oral Q4H PRN Nira Conn A, NP   30 mL at 01/28/18 1007  . ARIPiprazole (ABILIFY) tablet 5 mg  5 mg Oral Daily Malvin Johns, MD   5 mg at 01/30/18 0853  . FLUoxetine (PROZAC) capsule 20 mg  20 mg Oral Daily Malvin Johns, MD   20 mg at 01/30/18 0853  . magnesium hydroxide (MILK OF MAGNESIA) suspension 30 mL  30 mL Oral Daily PRN Nira Conn A, NP      . pantoprazole (PROTONIX) EC tablet 40 mg  40 mg Oral Daily Money, Travis B, FNP   40 mg at 01/30/18 0853  . prazosin  (MINIPRESS) capsule 2 mg  2 mg Oral QHS Kerry Hough, PA-C   2 mg at 01/29/18 2141  . prenatal multivitamin tablet 1 tablet  1 tablet Oral Q1200 Malvin Johns, MD   1 tablet at 01/29/18 1210  . traZODone (DESYREL) tablet 100 mg  100 mg Oral QHS Kerry Hough, PA-C   100 mg at 01/29/18 2141   Lab Results:  No results found for this or any previous visit (from the past 48 hour(s)). Blood Alcohol level:  Lab Results  Component Value Date   ETH <10 01/26/2018   ETH <10 01/17/2018  Metabolic Disorder Labs: Lab Results  Component Value Date   HGBA1C 5.2 01/27/2018   MPG 102.54 01/27/2018   Lab Results  Component Value Date   PROLACTIN 94.3 (H) 01/27/2018   Lab Results  Component Value Date   CHOL 137 01/27/2018   TRIG 147 01/27/2018   HDL 40 (L) 01/27/2018   CHOLHDL 3.4 01/27/2018   VLDL 29 01/27/2018   LDLCALC 68 01/27/2018   Physical Findings: AIMS: Facial and Oral Movements Muscles of Facial Expression: None, normal Lips and Perioral Area: None, normal Jaw: None, normal Tongue: None, normal,Extremity Movements Upper (arms, wrists, hands, fingers): None, normal Lower (legs, knees, ankles, toes): None, normal, Trunk Movements Neck, shoulders, hips: None, normal, Overall Severity Severity of abnormal movements (highest score from questions above): None, normal Incapacitation due to abnormal movements: None, normal Patient's awareness of abnormal movements (rate only patient's report): No Awareness, Dental Status Current problems with teeth and/or dentures?: No Does patient usually wear dentures?: No  CIWA:  CIWA-Ar Total: 0 COWS:  COWS Total Score: 0  Musculoskeletal: Strength & Muscle Tone: within normal limits Gait & Station: normal Patient leans: N/A  Psychiatric Specialty Exam: Physical Exam  Nursing note and vitals reviewed.   ROS  Blood pressure 116/80, pulse 100, temperature (!) 97.1 F (36.2 C), temperature source Oral, resp. rate 20, height 5\' 2"   (1.575 m), weight 118.8 kg, SpO2 99 %.Body mass index is 47.92 kg/m.  General Appearance: casual  Eye Contact:  Fair  Speech:  Clear and Coherent  Volume:  Normal  Mood:  Dysphoric  Affect:  Depressed  Thought Process:  Coherent  Orientation:  Full (Time, Place, and Person)  Thought Content:  Tangential  Suicidal Thoughts:  Yes.  with intent/plan  Homicidal Thoughts:  No  Memory:  Immediate;   Fair  Judgement:  Intact  Insight:  Fair  Psychomotor Activity:  Normal  Concentration:  Concentration: Good  Recall:  Good  Fund of Knowledge:  Good  Language:  Good  Akathisia:  Negative  Handed:  Right  AIMS (if indicated):     Assets:  Communication Skills  ADL's:  Intact  Cognition:  WNL  Sleep:  Number of Hours: 6.75     Treatment Plan Summary: Daily contact with patient to assess and evaluate symptoms and progress in treatment.  - Continue inpatient hospitalization.  - Will continue today 01/30/2018 plan as below except where it is noted.  Mood control.     - Continue Abilify 5 mg po daily.  Depression.     - Continue Prozac 20 mg daily.  PTSD/Nightmares.     - Continue Minipress 2 mg po Q hs.  Insomnia.      - Continue Trazodone 100 mg po Q hs.  GERD.    - Continue Protonix 40 mg po daily Q am.  Patient to continue to attend & participate in the group sessions.  Discharge disposition is ongoing.  Armandina StammerAgnes Nwoko, NP, pmhnp, fnp-bc 01/30/2018, 11:15 AMPatient ID: Tamara MoritaQueenie Rhea [name], female   DOB: 05/24/1995, 22 y.o.   MRN: [id number]

## 2018-01-30 NOTE — Progress Notes (Signed)
D: Pt denies SI/HI/AV. Pt is pleasant and cooperative. Pt stated she was doing better, pt visible on the unit this evening. Pt presented very sill, but appropriate this evening. A: Pt was offered support and encouragement. Pt was given scheduled medications. Pt was encourage to attend groups. Q 15 minute checks were done for safety.  R:Pt attends groups and interacts well with peers and staff. Pt is taking medication. Pt has no complaints.Pt receptive to treatment and safety maintained on unit.  Problem: Education: Goal: Emotional status will improve Outcome: Progressing   Problem: Education: Goal: Mental status will improve Outcome: Progressing   Problem: Education: Goal: Verbalization of understanding the information provided will improve Outcome: Progressing   Problem: Coping: Goal: Ability to verbalize frustrations and anger appropriately will improve Outcome: Progressing   Problem: Coping: Goal: Ability to demonstrate self-control will improve Outcome: Progressing   Problem: Safety: Goal: Periods of time without injury will increase Outcome: Progressing

## 2018-01-30 NOTE — Progress Notes (Signed)
Adult Psychoeducational Group Note  Date:  01/30/2018 Time:  12:13 AM  Group Topic/Focus:  Wrap-Up Group:   The focus of this group is to help patients review their daily goal of treatment and discuss progress on daily workbooks.  Participation Level:  Did Not Attend  Participation Quality:  Did not attend  Affect:  Did not attend  Cognitive:  Did not attend  Insight: None  Engagement in Group:  Did not attend  Modes of Intervention:  Did not attend  Additional Comments:  Pt did not attend evening wrap up group tonight.  Tamara FurnaceChristopher  [name] 01/30/2018, 12:13 AM

## 2018-01-31 MED ORDER — ARIPIPRAZOLE 5 MG PO TABS: 5.0000 mg | ORAL_TABLET | Freq: Every day | ORAL | 0 refills | Status: DC

## 2018-01-31 MED ORDER — FLUOXETINE HCL 20 MG PO CAPS: 20.0000 mg | ORAL_CAPSULE | Freq: Every day | ORAL | 0 refills | Status: DC

## 2018-01-31 MED ORDER — PRENATAL MULTIVITAMIN CH: 1.0000 | ORAL_TABLET | Freq: Every day | ORAL | Status: DC

## 2018-01-31 MED ORDER — HYDROXYZINE HCL 50 MG PO TABS
50.0000 mg | ORAL_TABLET | Freq: Once | ORAL | Status: AC
  Administered 2018-01-31: 50 mg via ORAL
  Filled 2018-01-31 (×2): qty 1

## 2018-01-31 MED ORDER — PANTOPRAZOLE SODIUM 40 MG PO TBEC: 40.0000 mg | DELAYED_RELEASE_TABLET | Freq: Every day | ORAL | 0 refills | Status: DC

## 2018-01-31 NOTE — Progress Notes (Signed)
Adult Psychoeducational Group Note  Date:  01/31/2018 Time:  1:12 AM  Group Topic/Focus:  Wrap-Up Group:   The focus of this group is to help patients review their daily goal of treatment and discuss progress on daily workbooks.  Participation Level:  Active  Participation Quality:  Appropriate  Affect:  Appropriate  Cognitive:  Appropriate  Insight: Appropriate  Engagement in Group:  Engaged  Modes of Intervention:  Discussion  Additional Comments: Pt stated her goal was control her emotional outburst today. Pt stated she accomplished her goal and was happy all day long. Pt rated her over all day a 6 out of 10. Pt stated she attend all groups held today.  Felipa FurnaceChristopher  Xzander Gilham 01/31/2018, 1:12 AM

## 2018-01-31 NOTE — Progress Notes (Signed)
  [facility]  Will you be returning to the same living situation after discharge:  Yes,  patient reports that she is returning to her apartment with her roommates At discharge, do you have transportation home?: Yes,  patient reports that she is taking an Benedetto Goad[employer]ber back to her apartment Do you have the ability to pay for your medications: No.  Release of information consent forms completed and in the chart;  Patient's signature needed at discharge.  Patient to Follow up at: Follow-up Information    [facility]. Go on 01/31/2018.   Specialty:  Behavioral Health Why:  Please attend your medication management appointment on Monday, 01/31/18 at 2:15p Therapy appointment on Monday, 02/14/18 at 4:00p.  Contact information: [address] [place] [address] [phone]           Next level of care provider [facility]:yes  Safety Planning and Suicide Prevention discussed: Yes,  with the patient   Have you used any form of tobacco in the last 30 days? (Cigarettes, Smokeless Tobacco, Cigars, and/or Pipes): No  Has patient been referred to the Quitline?: N/A patient is not a smoker  Patient has been referred for addiction treatment: N/A  Maeola SarahJolan E Karna Abed, LCSWA 01/31/2018, 2:22 PM
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
## 2018-01-31 NOTE — Plan of Care (Signed)
Pt was able to identify some coping skills at completion of recreation therapy group sessions.   Barbette Mcglaun, LRT/CTRS 

## 2018-01-31 NOTE — Progress Notes (Signed)
Recreation Therapy Notes  INPATIENT RECREATION TR PLAN  Patient Details Name: Tamara [name] MRN: [id number] DOB: Jul 27, 1995 Today's Date: 01/31/2018  Rec Therapy Plan Is patient appropriate for Therapeutic Recreation?: Yes Treatment times per week: about 3 days Estimated Length of Stay: 5-7 days TR Treatment/Interventions: Group participation (Comment)  Discharge Criteria Pt will be discharged from therapy if:: Discharged Treatment plan/goals/alternatives discussed and agreed upon by:: Patient/family  Discharge Summary Short term goals set: See patient care plan Short term goals met: Adequate for discharge Progress toward goals comments: Groups attended Which groups?: Stress management Reason goals not met: None Therapeutic equipment acquired: N/A Reason patient discharged from therapy: Discharge from hospital Pt/family agrees with progress & goals achieved: Yes Date patient discharged from therapy: 01/31/18    Victorino Sparrow, LRT/CTRS  Ria Comment, [place] 01/31/2018, 11:04 AM
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
## 2018-01-31 NOTE — Progress Notes (Signed)
Recreation Therapy Notes  Date: 11.25.19 Time: 1000 Location: [address] Dayroom  Group Topic: Anxiety  Goal Area(s) Addresses:  Patient will identify triggers for anxiety.  Patient will identify physical symptoms for anxiety.  Patient will identify coping skills for anxiety.   Intervention: Worksheet  Activity: Introduction to Anxiety.  Patients were to identify their triggers to anxiety, the physical symptoms they have when anxious, the thoughts they have from anxiety and coping skills for dealing with anxiety.  Education:Anxiety, Discharge Planning   Education Outcome: Acknowledges education/In group clarification offered/Needs additional education.   Clinical Observations/Feedback: Pt did not attend group.     Caroll RancherMarjette Jerrelle Michelsen, LRT/CTRS     Caroll RancherLindsay, Ambreen Tufte A 01/31/2018 12:09 PM

## 2018-01-31 NOTE — Progress Notes (Signed)
Patient discharged to lobby. Patient was stable and appreciative at that time. All papers and prescriptions were given and valuables returned. Verbal understanding expressed. Denies SI/HI and A/VH. Patient given opportunity to express concerns and ask questions.  Suicide information hotline leaflet explained and given to patient upon discharge.

## 2018-01-31 NOTE — [facility] Suicide Risk Assessment (Signed)
[facility]   Principal Problem: Schizoaffective disorder, depressive type (HCC) Discharge Diagnoses: Principal Problem:   Schizoaffective disorder, depressive type (HCC)   Total Time spent with patient: 45 minutes   Musculoskeletal: Strength & Muscle Tone: within normal limits Gait & Station: normal Patient leans: N/A  Psychiatric Specialty Exam: Physical Exam  VSS alert-ox3 mood stable    General Appearance: Casual  Eye Contact:  Good  Speech:  Normal Rate  Volume:  Normal  Mood:  Euthymic  Affect:  Congruent  Thought Process:  Coherent  Orientation:  Full (Time, Place, and Person)  Thought Content:  Logical  Suicidal Thoughts:  No  Homicidal Thoughts:  No  Memory:  Immediate;   Good  Judgement:  Good  Insight:  Good  Psychomotor Activity:  Normal  Concentration:  Concentration: Fair  Recall:  Good  Fund of Knowledge:  Good  Language:  Good  Akathisia:  NA  Handed:  Right  AIMS (if indicated):     Assets:  Communication Skills  ADL's:  Intact  Cognition:  WNL  Sleep:  Number of Hours: 6.25    Mental Status Per Nursing Assessment::   On Admission:  Suicidal ideation indicated by patient, Suicide plan, Self-harm thoughts  Demographic Factors:  Caucasian  Loss Factors: Decrease in vocational status  Historical Factors: NA  Risk Reduction Factors:   NA  Continued Clinical Symptoms:  Dysthymia  Cognitive Features That Contribute To Risk:  None    Suicide Risk:  Minimal: No identifiable suicidal ideation.  Patients presenting with no risk factors but with morbid ruminations; may be classified as minimal risk based on the severity of the depressive symptoms  Follow-up Information    [facility]. Go on 01/31/2018.   Specialty:  Behavioral Health Why:  Please attend your medication management appointment on Monday, 01/31/18 at 2:15p Therapy appointment on Monday, 02/14/18 at 4:00p.  Contact  information: [address] [place] [address] [phone]           Plan Of Care/Follow-up recommendations:  Activity:  full  Tamara Grullon, MD 01/31/2018, 8:54 AM

## 2018-03-14 ENCOUNTER — Emergency Department (HOSPITAL_COMMUNITY)
Admission: EM | Admit: 2018-03-14 | Discharge: 2018-03-15 | Disposition: A | Discharge status: Home / Self Care | Payer: Medicaid Other | Attending: Emergency Medicine | Admitting: Emergency Medicine

## 2018-03-14 ENCOUNTER — Encounter (HOSPITAL_COMMUNITY): Payer: Self-pay | Admitting: Emergency Medicine

## 2018-03-14 DIAGNOSIS — R45851 Suicidal ideations: Secondary | ICD-10-CM | POA: Insufficient documentation

## 2018-03-14 DIAGNOSIS — Z79899 Other long term (current) drug therapy: Secondary | ICD-10-CM | POA: Diagnosis not present

## 2018-03-14 DIAGNOSIS — F603 Borderline personality disorder: Secondary | ICD-10-CM | POA: Diagnosis not present

## 2018-03-14 DIAGNOSIS — F315 Bipolar disorder, current episode depressed, severe, with psychotic features: Secondary | ICD-10-CM | POA: Insufficient documentation

## 2018-03-14 DIAGNOSIS — F329 Major depressive disorder, single episode, unspecified: Secondary | ICD-10-CM | POA: Diagnosis present

## 2018-03-14 LAB — CBC
HCT: 45.6 % (ref 36.0–46.0)
HEMOGLOBIN: 14.5 g/dL (ref 12.0–15.0)
MCH: 28.7 pg (ref 26.0–34.0)
MCHC: 31.8 g/dL (ref 30.0–36.0)
MCV: 90.3 fL (ref 80.0–100.0)
NRBC: 0 % (ref 0.0–0.2)
PLATELETS: 409 10*3/uL — AB (ref 150–400)
RBC: 5.05 MIL/uL (ref 3.87–5.11)
RDW: 12.6 % (ref 11.5–15.5)
WBC: 15.8 10*3/uL — ABNORMAL HIGH (ref 4.0–10.5)

## 2018-03-14 LAB — COMPREHENSIVE METABOLIC PANEL
ALBUMIN: 3.9 g/dL (ref 3.5–5.0)
ALT: 18 U/L (ref 0–44)
AST: 17 U/L (ref 15–41)
Alkaline Phosphatase: 117 U/L (ref 38–126)
Anion gap: 9 (ref 5–15)
BUN: 8 mg/dL (ref 6–20)
CALCIUM: 8.9 mg/dL (ref 8.9–10.3)
CHLORIDE: 105 mmol/L (ref 98–111)
CO2: 25 mmol/L (ref 22–32)
CREATININE: 0.75 mg/dL (ref 0.44–1.00)
GFR calc Af Amer: >60 mL/min (ref >60)
GFR calc non Af Amer: >60 mL/min (ref >60)
GLUCOSE: 107 mg/dL — AB (ref 70–99)
Potassium: 3.3 mmol/L — ABNORMAL LOW (ref 3.5–5.1)
Sodium: 139 mmol/L (ref 135–145)
Total Bilirubin: 0.5 mg/dL (ref 0.3–1.2)
Total Protein: 7.9 g/dL (ref 6.5–8.1)

## 2018-03-14 LAB — RAPID URINE DRUG SCREEN, HOSP PERFORMED
AMPHETAMINES: NOT DETECTED
BENZODIAZEPINES: NOT DETECTED
Barbiturates: NOT DETECTED
Cocaine: NOT DETECTED
Opiates: NOT DETECTED
TETRAHYDROCANNABINOL: NOT DETECTED

## 2018-03-14 LAB — I-STAT BETA HCG BLOOD, ED (MC, WL, AP ONLY)

## 2018-03-14 LAB — ETHANOL: Alcohol, Ethyl (B): <10 mg/dL (ref <10)

## 2018-03-14 LAB — ACETAMINOPHEN LEVEL: Acetaminophen (Tylenol), Serum: <10 ug/mL — ABNORMAL LOW (ref 10–30)

## 2018-03-14 LAB — SALICYLATE LEVEL: Salicylate Lvl: <7 mg/dL (ref 2.8–30.0)

## 2018-03-14 MED ORDER — ARIPIPRAZOLE 5 MG PO TABS
5.0000 mg | ORAL_TABLET | Freq: Every day | ORAL | Status: DC
  Administered 2018-03-14 – 2018-03-15 (×2): 5 mg via ORAL
  Filled 2018-03-14 (×2): qty 1

## 2018-03-14 MED ORDER — PANTOPRAZOLE SODIUM 40 MG PO TBEC
40.0000 mg | DELAYED_RELEASE_TABLET | Freq: Every day | ORAL | Status: DC
  Administered 2018-03-14 – 2018-03-15 (×2): 40 mg via ORAL
  Filled 2018-03-14 (×2): qty 1

## 2018-03-14 MED ORDER — FLUOXETINE HCL 20 MG PO CAPS
20.0000 mg | ORAL_CAPSULE | Freq: Every day | ORAL | Status: DC
  Administered 2018-03-14 – 2018-03-15 (×2): 20 mg via ORAL
  Filled 2018-03-14 (×2): qty 1

## 2018-03-14 MED ORDER — PRENATAL MULTIVITAMIN CH
1.0000 | ORAL_TABLET | Freq: Every day | ORAL | Status: DC
  Filled 2018-03-14: qty 1
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
## 2018-03-14 NOTE — BH Assessment (Signed)
[facility] Assessment Progress Note  Case was staffed with Shaune Pollack DNP who recommended patient be observed and monitored for safety. Patient will also be evaluated for possible medication interventions.

## 2018-03-14 NOTE — ED Notes (Signed)
Pt has slept since she arrived to unit.  She woke to take her meds and began talking about the bullying that has brought her to being suicidal.  Patients affect is incongruent with her thought process.  She is pleasant and cooperative.
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
## 2018-03-15 DIAGNOSIS — F603 Borderline personality disorder: Secondary | ICD-10-CM

## 2018-03-15 DIAGNOSIS — R45851 Suicidal ideations: Secondary | ICD-10-CM

## 2018-03-15 NOTE — ED Notes (Signed)
Pt discharged home. Discharged instructions read to pt who verbalized understanding. All belongings returned to pt who signed for same. Denies SI/HI, is not delusional and not responding to internal stimuli. Escorted pt to the ED exit.  Pt has [employer] bus pass

## 2018-03-15 NOTE — Discharge Instructions (Signed)
For your behavioral health needs, you are advised to continue treatment with your providers at [facility]:       [facility]      [address]      [place], [place] [address]      [phone]

## 2018-03-15 NOTE — Consult Note (Addendum)
[facility] Psych ED Discharge  03/15/2018 3:17 PM [name]  MRN:  [id number] Principal Problem: Borderline personality disorder [facility]) Discharge Diagnoses: Principal Problem:   Borderline personality disorder [facility])   Subjective:  Tamara [name] reports that she was upset about a female that has been harassing her friends. He has also reportedly bullied her. He told her to harm herself and he would stop harassing her friends. She reports that after speaking to medical professionals she is aware that she exercised poor judgment. She denies SI, HI or AVH. She denies any intention to harm self. She is future oriented. She reports that she is doing well in school. 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 She reports that her job is flexible with her schedule. She feels safe returning home. She gets along with her roommates. She has an appointment is [facility] on Friday.   Total Time spent with patient: 30 minutes  Past Psychiatric History: Depression, anxiety, PTSD, BPAD, ADHD, schizoaffective disorder and borderline personality disorder.   Past Medical History:  Past Medical History:  Diagnosis Date  . ADHD   . Autism   . Bipolar disorder (HCC)   . Chronic back pain    "mostly lower; sometimes all over" (03/22/2017)  . Chronic stomach ulcer   . Depression   . GERD (gastroesophageal reflux disease)   . Headache    "a few/week" (03/22/2017)  . Migraine    "monthly recently" (03/22/2017)  . Personality disorder (HCC)   . PTSD (post-traumatic stress disorder)   . Seizures (HCC)    "very random; I lose memory of what happens; usually happens when I'm in bed; probably 2-3/year" (03/22/2017)   No past surgical history on file. Family History:  Family History  Problem Relation Age of Onset  . Diabetes Mother   . Hypertension Maternal Grandmother   . Hyperlipidemia Maternal Grandmother   . Diabetes Maternal Grandmother    Family  Psychiatric  History: Father-alcoholism and drug abuse and mother-BPAD ("She has serial killer tendencies.") Social History:  Social History   Substance and Sexual Activity  Alcohol Use No  . Alcohol/week: 0.0 standard drinks     Social History   Substance and Sexual Activity  Drug Use No    Social History   Socioeconomic History  . Marital status: Single    Spouse name: Not on file  . Number of children: Not on file  . Years of education: Not on file  . Highest education level: Not on file  Occupational History  . Not on file  Social Needs  . Financial resource strain: Not on file  . Food insecurity:    Worry: Not on file    Inability: Not on file  . Transportation needs:    Medical: Not on file    Non-medical: Not on file  Tobacco Use  . Smoking status: Never Smoker  . Smokeless tobacco: Never Used  Substance and Sexual Activity  . Alcohol use: No    Alcohol/week: 0.0 standard drinks  . Drug use: No  . Sexual activity: Yes  Lifestyle  . Physical activity:    Days per week: Not on file    Minutes per session: Not on file  . Stress: Not on file  Relationships  . Social connections:    Talks on phone: Not on file    Gets together: Not on file    Attends religious service: Not on file    Active member  of club or organization: Not on file    Attends meetings of clubs or organizations: Not on file    Relationship status: Not on file  Other Topics Concern  . Not on file  Social History Narrative  . Not on file    Has this patient used any form of tobacco in the last 30 days? (Cigarettes, Smokeless Tobacco, Cigars, and/or Pipes) Prescription not provided because: Patient does not use tobacco products.  Current Medications: Current Facility-Administered Medications  Medication Dose Route Frequency Provider Last Rate Last Dose  . ARIPiprazole (ABILIFY) tablet 5 mg  5 mg Oral Daily Charm RingsLord, Jamison Y, NP   5 mg at 03/15/18 1006  . FLUoxetine (PROZAC) capsule 20 mg   20 mg Oral Daily Charm RingsLord, Jamison Y, NP   20 mg at 03/15/18 1006  . pantoprazole (PROTONIX) EC tablet 40 mg  40 mg Oral Daily Charm RingsLord, Jamison Y, NP   40 mg at 03/15/18 1006  . prenatal multivitamin tablet 1 tablet  1 tablet Oral Q1200 Charm RingsLord, Jamison Y, NP       Current Outpatient Medications  Medication Sig Dispense Refill  . ARIPiprazole (ABILIFY) 5 MG tablet Take 1 tablet (5 mg total) by mouth daily. For mood control 30 tablet 0  . atorvastatin (LIPITOR) 20 MG tablet Take 20 mg by mouth daily.    Marland Kitchen. FLUoxetine (PROZAC) 20 MG capsule Take 1 capsule (20 mg total) by mouth daily. For depression 30 capsule 0  . pantoprazole (PROTONIX) 40 MG tablet Take 1 tablet (40 mg total) by mouth daily. For acid reflux 15 tablet 0  . VITAMIN D PO Take 1 tablet by mouth daily.    Marland Kitchen. amphetamine-dextroamphetamine (ADDERALL) 30 MG tablet Take 30 mg by mouth daily.    Marland Kitchen. lurasidone (LATUDA) 80 MG TABS tablet Take 80 mg by mouth daily with breakfast.    . prazosin (MINIPRESS) 2 MG capsule Take 2 mg by mouth at bedtime.    . Prenatal Vit-Fe Fumarate-FA (PRENATAL MULTIVITAMIN) TABS tablet Take 1 tablet by mouth daily at 12 noon. Vitamin supplement (Patient not taking: Reported on 03/14/2018)    . traZODone (DESYREL) 100 MG tablet Take 100 mg by mouth at bedtime.    . vortioxetine HBr (TRINTELLIX) 10 MG TABS tablet Take 10 mg by mouth daily.     PTA Medications: (Not in a hospital admission)   Musculoskeletal: Strength & Muscle Tone: within normal limits Gait & Station: normal Patient leans: N/A  Psychiatric Specialty Exam: Physical Exam  Nursing note and vitals reviewed. Constitutional: She is oriented to person, place, and time. She appears well-developed and well-nourished.  HENT:  Head: Normocephalic and atraumatic.  Neck: Normal range of motion.  Respiratory: Effort normal.  Musculoskeletal: Normal range of motion.  Neurological: She is alert and oriented to person, place, and time.  Psychiatric: She has a  normal mood and affect. Her speech is normal and behavior is normal. Judgment and thought content normal. Cognition and memory are normal.    Review of Systems  Psychiatric/Behavioral: Negative for hallucinations, substance abuse and suicidal ideas.  All other systems reviewed and are negative.   Blood pressure (!) 119/95, pulse 100, temperature 98.4 F (36.9 C), temperature source Oral, resp. rate 18, SpO2 96 %.There is no height or weight on file to calculate BMI.  General Appearance: Fairly Groomed, young, Hispanic female, wearing paper hospital scrubs with long hair and red lipstick who is lying in bed. NAD.   Eye Contact:  Good  Speech:  Clear and Coherent and Normal Rate  Volume:  Normal  Mood:  Euthymic  Affect:  Appropriate and Congruent  Thought Process:  Goal Directed, Linear and Descriptions of Associations: Intact  Orientation:  Full (Time, Place, and Person)  Thought Content:  Logical  Suicidal Thoughts:  No  Homicidal Thoughts:  No  Memory:  Immediate;   Good Recent;   Good Remote;   Good  Judgement:  Fair  Insight:  Fair  Psychomotor Activity:  Normal  Concentration:  Concentration: Good and Attention Span: Good  Recall:  Good  Fund of Knowledge:  Good  Language:  Good  Akathisia:  No  Handed:  Right  AIMS (if indicated):   N/A  Assets:  Communication Skills Desire for Improvement Financial Resources/Insurance Housing Physical Health Social Support  ADL's:  Intact  Cognition:  WNL  Sleep:   N/A     Demographic Factors:  Adolescent or young adult  Loss Factors: NA  Historical Factors: Family history of mental illness or substance abuse  Risk Reduction Factors:   Employed, Living with another person, especially a relative, Positive social support and Positive therapeutic relationship  Continued Clinical Symptoms:  More than one psychiatric diagnosis Previous Psychiatric Diagnoses and Treatments Medical Diagnoses and  Treatments/Surgeries  Cognitive Features That Contribute To Risk:  None    Suicide Risk:  Minimal: No identifiable suicidal ideation.  Patients presenting with no risk factors but with morbid ruminations; may be classified as minimal risk based on the severity of the depressive symptoms  Assessment:  Tamara [name] is a 23 y.o. female who was admitted with SI in the setting of psychosocial stressors. Today she denies SI, HI or AVH. She is future oriented and able to safety plan. She does not warrant inpatient psychiatric hospitalization at this time.   Plan Of Care/Follow-up recommendations:  -Continue psychotropic medications as prescribed.  -Will follow up with outpatient provider for medication management.  Disposition: Discharge home.  Cherly BeachJacqueline J Danene Montijo, DO 03/15/2018, 3:17 PM

## 2018-03-15 NOTE — BH Assessment (Signed)
[facility] Assessment Progress Note  Per Juanetta Beets, DO, this pt does not require psychiatric hospitalization at this time.  Pt is to be discharged from [facility] with recommendation to continue treatment with her regular providers at [facility].  This has been included in pt's discharge instructions.  Pt's nurse, Diane, has been notified.  Doylene Canning, MA Triage Specialist [phone]

## 2018-06-06 ENCOUNTER — Telehealth: Payer: Self-pay | Admitting: Gastroenterology

## 2018-06-06 NOTE — Telephone Encounter (Signed)
PT was given PROTONIX but does not remember from where she has seen Dr. Christella Hartigan before and would like to know if she should have a Webex meeting since she is still having heart burn and nasuea even when she takes the medication.

## 2018-06-06 NOTE — Telephone Encounter (Signed)
Pt. States she has had acid reflux since she was 16 yrs. But the last 2 weeks if has gotten worse. States she gets heart burn and nausea everytime she eats or drinks anything. She is taking Protonix. Left message for pt. To call back to schedule a virtual visit with a PA  Pt. Called back and is now scheduled for Virtual visit 3/31 @ 10:15 am

## 2018-06-07 ENCOUNTER — Other Ambulatory Visit: Payer: Self-pay

## 2018-06-07 ENCOUNTER — Ambulatory Visit: Payer: Medicaid Other | Admitting: Physician Assistant

## 2018-06-07 ENCOUNTER — Ambulatory Visit: Payer: Self-pay

## 2018-06-07 NOTE — Telephone Encounter (Signed)
Pt called in c/o waking up with a sore throat.   She is vomiting clear, white mucus.   Drinking Ginger Al.  Feels tired.   Should I go to the emergency room?  See triage notes.  She goes to the [facility].   "I just called to see if I needed to go to the ED".   I let her know she did not need to go but to stay home and avoid being around other people.    I let her know to call her PCP if her symptoms became worse or she was not getting better, also if gets shortness of breath, cough, and fever.   She verbalized understanding.       Reason for Disposition . [1] Sore throat is the only symptom AND [2] sore throat present < 48 hours  Answer Assessment - Initial Assessment Questions 1. ONSET: "When did the throat start hurting?" (Hours or days ago)      I woke up this morning with my throat sore in my right lymph node. 2. SEVERITY: "How bad is the sore throat?" (Scale 1-10; mild, moderate or severe)   - MILD (1-3):  doesn't interfere with eating or normal activities   - MODERATE (4-7): interferes with eating some solids and normal activities   - SEVERE (8-10):  excruciating pain, interferes with most normal activities   - SEVERE DYSPHAGIA: can't swallow liquids, drooling     I'm drinking Ginger Al. 3. STREP EXPOSURE: "Has there been any exposure to strep within the past week?" If so, ask: "What type of contact occurred?"      No.  I've been doing the social distancing.  4.  VIRAL SYMPTOMS: "Are there any symptoms of a cold, such as a runny nose, cough, hoarse voice or red eyes?"      Denies shortness of breath, cough or fever. 5. FEVER: "Do you have a fever?" If so, ask: "What is your temperature, how was it measured, and when did it start?"     No 6. PUS ON THE TONSILS: "Is there pus on the tonsils in the back of your throat?"     Not asked 7. OTHER SYMPTOMS: "Do you have any other symptoms?" (e.g., difficulty breathing, headache, rash)     Vomiting clear, white  mucus stuff.  I'm tired.  No other symptoms. 8. PREGNANCY: "Is there any chance you are pregnant?" "When was your last menstrual period?"     Not asked.  Protocols used: SORE THROAT-A-AH

## 2018-06-18 ENCOUNTER — Other Ambulatory Visit: Payer: Self-pay

## 2018-06-18 ENCOUNTER — Emergency Department (HOSPITAL_COMMUNITY)
Admission: EM | Admit: 2018-06-18 | Discharge: 2018-06-19 | Disposition: A | Discharge status: Home / Self Care | Payer: Medicaid Other | Attending: Emergency Medicine | Admitting: Emergency Medicine

## 2018-06-18 ENCOUNTER — Encounter (HOSPITAL_COMMUNITY): Payer: Self-pay | Admitting: Emergency Medicine

## 2018-06-18 DIAGNOSIS — F840 Autistic disorder: Secondary | ICD-10-CM | POA: Insufficient documentation

## 2018-06-18 DIAGNOSIS — G8929 Other chronic pain: Secondary | ICD-10-CM | POA: Insufficient documentation

## 2018-06-18 DIAGNOSIS — M549 Dorsalgia, unspecified: Secondary | ICD-10-CM | POA: Insufficient documentation

## 2018-06-18 DIAGNOSIS — Z79899 Other long term (current) drug therapy: Secondary | ICD-10-CM | POA: Insufficient documentation

## 2018-06-18 DIAGNOSIS — F909 Attention-deficit hyperactivity disorder, unspecified type: Secondary | ICD-10-CM | POA: Insufficient documentation

## 2018-06-18 MED ORDER — KETOROLAC TROMETHAMINE 30 MG/ML IJ SOLN
30.0000 mg | Freq: Once | INTRAMUSCULAR | Status: AC
  Administered 2018-06-19: 30 mg via INTRAMUSCULAR
  Filled 2018-06-18: qty 1

## 2018-06-18 NOTE — ED Notes (Signed)
Bed: WA23 Expected date:  Expected time:  Means of arrival:  Comments: 

## 2018-06-18 NOTE — ED Triage Notes (Signed)
Patient here from home with complaints of back pain. Hx of same. Ambulatory.

## 2018-06-18 NOTE — ED Notes (Signed)
Bed: DJ57 Expected date: 06/18/18 Expected time: 10:06 PM Means of arrival:  Comments:

## 2018-06-19 LAB — URINALYSIS, ROUTINE W REFLEX MICROSCOPIC
Glucose, UA: NEGATIVE mg/dL
Hgb urine dipstick: NEGATIVE
Ketones, ur: NEGATIVE mg/dL
Leukocytes,Ua: NEGATIVE
Nitrite: NEGATIVE
Protein, ur: 30 mg/dL — AB
Specific Gravity, Urine: 1.028 (ref 1.005–1.030)
pH: 5 (ref 5.0–8.0)

## 2018-06-19 LAB — BASIC METABOLIC PANEL
Anion gap: 12 (ref 5–15)
BUN: 8 mg/dL (ref 6–20)
CO2: 23 mmol/L (ref 22–32)
Calcium: 9.5 mg/dL (ref 8.9–10.3)
Chloride: 102 mmol/L (ref 98–111)
Creatinine, Ser: 0.84 mg/dL (ref 0.44–1.00)
GFR calc Af Amer: >60 mL/min (ref >60)
GFR calc non Af Amer: >60 mL/min (ref >60)
Glucose, Bld: 98 mg/dL (ref 70–99)
Potassium: 3.2 mmol/L — ABNORMAL LOW (ref 3.5–5.1)
Sodium: 137 mmol/L (ref 135–145)

## 2018-06-19 LAB — PREGNANCY, URINE: Preg Test, Ur: NEGATIVE

## 2018-06-19 LAB — CK: Total CK: 76 U/L (ref 38–234)

## 2018-06-19 MED ORDER — NAPROXEN 500 MG PO TABS: 500.0000 mg | ORAL_TABLET | Freq: Two times a day (BID) | ORAL | 0 refills | Status: DC

## 2018-06-19 NOTE — Discharge Instructions (Addendum)
Your seen today for chronic back pain.  Your work-up is reassuring.  Follow-up with your primary doctor regarding further pain management.  If you develop weakness, numbness, tingling in the lower extremities, bowel or bladder difficulty, this is reason to be reevaluated.

## 2018-06-19 NOTE — ED Provider Notes (Signed)
[facility]-EMERGENCY DEPT Provider Note   CSN: [id number] Arrival date & time: 06/18/18  2241    History   Chief Complaint Chief Complaint  Patient presents with  . Back Pain    HPI Tamara [name] is a 23 y.o. female.     HPI  This is a 23 year old female with a history of bipolar disorder, borderline personality disorder who presents with back pain.  Patient reports chronic history of back pain.  She states it has been ongoing for "a long time."  She has previously tried NSAIDs and a tens unit with minimal relief.  She states over the last 2 to 3 days she has had increasing pain.  She states it is "all over my back."  Reports radiation into the right leg.  Denies any weakness, numbness, tingling of the legs, bowel or bladder difficulties.  Denies any flank pain or urinary symptoms.  She states that she has talked to her primary doctor about this but "I just want to feel better."  Denies any fevers, drug use, chest pain, abdominal pain.  Past Medical History:  Diagnosis Date  . ADHD   . Autism   . Bipolar disorder (HCC)   . Chronic back pain    "mostly lower; sometimes all over" (03/22/2017)  . Chronic stomach ulcer   . Depression   . GERD (gastroesophageal reflux disease)   . Headache    "a few/week" (03/22/2017)  . Migraine    "monthly recently" (03/22/2017)  . Personality disorder (HCC)   . PTSD (post-traumatic stress disorder)   . Seizures (HCC)    "very random; I lose memory of what happens; usually happens when I'm in bed; probably 2-3/year" (03/22/2017)    Patient Active Problem List   Diagnosis Date Noted  . Schizoaffective disorder, depressive type (HCC) 01/26/2018  . Borderline personality disorder (HCC) 12/20/2016  . Schizoaffective disorder (HCC) 12/19/2016    History reviewed. No pertinent surgical history.   OB History   No obstetric history on file.      Home Medications    Prior to Admission medications    Medication Sig Start Date End Date Taking? Authorizing Provider  amphetamine-dextroamphetamine (ADDERALL) 30 MG tablet Take 30 mg by mouth daily.    [provider]  ARIPiprazole (ABILIFY) 5 MG tablet Take 1 tablet (5 mg total) by mouth daily. For mood control 02/01/18   Armandina StammerNwoko, Agnes I, NP  atorvastatin (LIPITOR) 20 MG tablet Take 20 mg by mouth daily.    [provider]  FLUoxetine (PROZAC) 20 MG capsule Take 1 capsule (20 mg total) by mouth daily. For depression 02/01/18   Armandina StammerNwoko, Agnes I, NP  lurasidone (LATUDA) 80 MG TABS tablet Take 80 mg by mouth daily with breakfast.    [provider]  naproxen (NAPROSYN) 500 MG tablet Take 1 tablet (500 mg total) by mouth 2 (two) times daily. 06/19/18   , Mayer Maskerourtney F, MD  pantoprazole (PROTONIX) 40 MG tablet Take 1 tablet (40 mg total) by mouth daily. For acid reflux 01/31/18   Armandina StammerNwoko, Agnes I, NP  prazosin (MINIPRESS) 2 MG capsule Take 2 mg by mouth at bedtime.    [provider]  Prenatal Vit-Fe Fumarate-FA (PRENATAL MULTIVITAMIN) TABS tablet Take 1 tablet by mouth daily at 12 noon. Vitamin supplement Patient not taking: Reported on 03/14/2018 01/31/18   Armandina StammerNwoko, Agnes I, NP  traZODone (DESYREL) 100 MG tablet Take 100 mg by mouth at bedtime.    [provider]  VITAMIN D PO Take 1 tablet by mouth daily.    [provider]  vortioxetine HBr (TRINTELLIX) 10 MG TABS tablet Take 10 mg by mouth daily.    [provider]    Family History Family History  Problem Relation Age of Onset  . Diabetes Mother   . Hypertension Maternal Grandmother   . Hyperlipidemia Maternal Grandmother   . Diabetes Maternal Grandmother     Social History Social History   Tobacco Use  . Smoking status: Never Smoker  . Smokeless tobacco: Never Used  Substance Use Topics  . Alcohol use: No    Alcohol/week: 0.0 standard drinks  . Drug use: No     Allergies   Asa [aspirin]; Pork-derived products; and  Lithium   Review of Systems Review of Systems  Constitutional: Negative for fever.  Respiratory: Negative for shortness of breath.   Cardiovascular: Negative for chest pain.  Genitourinary: Negative for difficulty urinating and dysuria.  Musculoskeletal: Positive for back pain.  Neurological: Negative for weakness and numbness.  All other systems reviewed and are negative.    Physical Exam Updated Vital Signs BP 108/69 (BP Location: Right Arm)   Pulse (!) 57   Temp 98 F (36.7 C) (Oral)   Resp 18   SpO2 100%   Physical Exam Vitals signs and nursing note reviewed.  Constitutional:      Appearance: She is well-developed.     Comments: Obese, nontoxic-appearing, no acute distress  HENT:     Head: Normocephalic and atraumatic.  Eyes:     Pupils: Pupils are equal, round, and reactive to light.  Cardiovascular:     Rate and Rhythm: Normal rate and regular rhythm.  Pulmonary:     Effort: Pulmonary effort is normal. No respiratory distress.  Musculoskeletal:     Comments: No midline tenderness to palpation, step-off, deformity, hyperesthesia with palpation of the entirety of the back both thoracic and lumbar  Skin:    General: Skin is warm and dry.  Neurological:     Mental Status: She is alert and oriented to person, place, and time.     Comments: 5 out of 5 strength bilateral lower extremities, normal and symmetric reflexes bilaterally, negative straight leg raise      ED Treatments / Results  Labs (all labs ordered are listed, but only abnormal results are displayed) Labs Reviewed  BASIC METABOLIC PANEL - Abnormal; Notable for the following components:      Result Value   Potassium 3.2 (*)    All other components within normal limits  URINALYSIS, ROUTINE W REFLEX MICROSCOPIC - Abnormal; Notable for the following components:   APPearance HAZY (*)    Bilirubin Urine SMALL (*)    Protein, ur 30 (*)    Bacteria, UA MANY (*)    Crystals PRESENT (*)    All other  components within normal limits  CK  PREGNANCY, URINE    EKG None  Radiology No results found.  Procedures Procedures (including critical care time)  Medications Ordered in ED Medications  ketorolac (TORADOL) 30 MG/ML injection 30 mg (30 mg Intramuscular Given 06/19/18 0030)     Initial Impression / Assessment and Plan / ED Course  I have reviewed the triage vital signs and the nursing notes.  Pertinent labs & imaging results that were available during my care of the patient were reviewed by me and considered in my medical decision making (see chart for details).        Patient presents with  back pain.  Mostly chronic in nature.  She is very hyperesthetic sensitive to touch throughout her back.  There is no lateralization.  There is no midline tenderness or deformity.  Doubt injury or spine involvement.  She has no signs or symptoms of cauda equina.  Urinalysis without evidence of urinary tract infection.  She does have crystals but because the pain is not lateralizing, do not feel that this is kidney stones.  No significant metabolic derangements and CK is normal.  Doubt acute emergent process.  Will discharge home with primary care follow-up.  After history, exam, and medical workup I feel the patient has been appropriately medically screened and is safe for discharge home. Pertinent diagnoses were discussed with the patient. Patient was given return precautions.   Final Clinical Impressions(s) / ED Diagnoses   Final diagnoses:  Chronic bilateral back pain, unspecified back location    ED Discharge Orders         Ordered    naproxen (NAPROSYN) 500 MG tablet  2 times daily     06/19/18 0143           , Mayer Masker, MD 06/19/18 [phone]

## 2018-06-19 NOTE — ED Notes (Signed)
Patient walked to the bathroom with no assistance. Patient did not have any difficulty walking.

## 2018-06-19 NOTE — ED Notes (Signed)
Pt stated she did not have to urinate at the moment but is aware we need a urine specimen.  Pt refused to let me draw her blood without someone else in the room to hold her down because she is so afraid of needles and doesn't want to be in anymore pain.

## 2018-07-08 ENCOUNTER — Encounter (HOSPITAL_COMMUNITY): Payer: Self-pay

## 2018-07-08 ENCOUNTER — Inpatient Hospital Stay (HOSPITAL_COMMUNITY)
Admission: AD | Admit: 2018-07-08 | Discharge: 2018-07-14 | DRG: 885 | Disposition: A | Discharge status: Home / Self Care | Payer: Medicaid Other | Source: Intra-hospital | Attending: Psychiatry | Admitting: Psychiatry

## 2018-07-08 ENCOUNTER — Emergency Department (HOSPITAL_COMMUNITY)
Admission: EM | Admit: 2018-07-08 | Discharge: 2018-07-08 | Disposition: A | Discharge status: Other Acute Inpatient Hospital | Payer: Medicaid Other | Attending: Emergency Medicine | Admitting: Emergency Medicine

## 2018-07-08 ENCOUNTER — Other Ambulatory Visit: Payer: Self-pay

## 2018-07-08 DIAGNOSIS — F840 Autistic disorder: Secondary | ICD-10-CM | POA: Diagnosis not present

## 2018-07-08 DIAGNOSIS — F251 Schizoaffective disorder, depressive type: Secondary | ICD-10-CM | POA: Diagnosis present

## 2018-07-08 DIAGNOSIS — R531 Weakness: Secondary | ICD-10-CM | POA: Diagnosis present

## 2018-07-08 DIAGNOSIS — Z79899 Other long term (current) drug therapy: Secondary | ICD-10-CM

## 2018-07-08 DIAGNOSIS — Z62819 Personal history of unspecified abuse in childhood: Secondary | ICD-10-CM | POA: Diagnosis present

## 2018-07-08 DIAGNOSIS — G4700 Insomnia, unspecified: Secondary | ICD-10-CM | POA: Diagnosis present

## 2018-07-08 DIAGNOSIS — F3132 Bipolar disorder, current episode depressed, moderate: Secondary | ICD-10-CM | POA: Diagnosis present

## 2018-07-08 DIAGNOSIS — F319 Bipolar disorder, unspecified: Secondary | ICD-10-CM | POA: Insufficient documentation

## 2018-07-08 DIAGNOSIS — F603 Borderline personality disorder: Secondary | ICD-10-CM | POA: Diagnosis present

## 2018-07-08 DIAGNOSIS — Z886 Allergy status to analgesic agent status: Secondary | ICD-10-CM

## 2018-07-08 DIAGNOSIS — F909 Attention-deficit hyperactivity disorder, unspecified type: Secondary | ICD-10-CM | POA: Diagnosis not present

## 2018-07-08 DIAGNOSIS — E785 Hyperlipidemia, unspecified: Secondary | ICD-10-CM | POA: Diagnosis present

## 2018-07-08 DIAGNOSIS — Z833 Family history of diabetes mellitus: Secondary | ICD-10-CM

## 2018-07-08 DIAGNOSIS — Z91018 Allergy to other foods: Secondary | ICD-10-CM

## 2018-07-08 DIAGNOSIS — R45851 Suicidal ideations: Secondary | ICD-10-CM | POA: Diagnosis present

## 2018-07-08 DIAGNOSIS — F259 Schizoaffective disorder, unspecified: Secondary | ICD-10-CM | POA: Diagnosis present

## 2018-07-08 DIAGNOSIS — F250 Schizoaffective disorder, bipolar type: Secondary | ICD-10-CM | POA: Diagnosis not present

## 2018-07-08 DIAGNOSIS — K219 Gastro-esophageal reflux disease without esophagitis: Secondary | ICD-10-CM | POA: Diagnosis present

## 2018-07-08 DIAGNOSIS — R45850 Homicidal ideations: Secondary | ICD-10-CM | POA: Insufficient documentation

## 2018-07-08 DIAGNOSIS — R000 Tachycardia, unspecified: Secondary | ICD-10-CM | POA: Insufficient documentation

## 2018-07-08 DIAGNOSIS — Z79891 Long term (current) use of opiate analgesic: Secondary | ICD-10-CM

## 2018-07-08 DIAGNOSIS — F4310 Post-traumatic stress disorder, unspecified: Secondary | ICD-10-CM | POA: Diagnosis present

## 2018-07-08 DIAGNOSIS — Z888 Allergy status to other drugs, medicaments and biological substances status: Secondary | ICD-10-CM

## 2018-07-08 DIAGNOSIS — F515 Nightmare disorder: Secondary | ICD-10-CM | POA: Diagnosis present

## 2018-07-08 DIAGNOSIS — F314 Bipolar disorder, current episode depressed, severe, without psychotic features: Secondary | ICD-10-CM

## 2018-07-08 DIAGNOSIS — Z915 Personal history of self-harm: Secondary | ICD-10-CM

## 2018-07-08 DIAGNOSIS — Z8249 Family history of ischemic heart disease and other diseases of the circulatory system: Secondary | ICD-10-CM

## 2018-07-08 DIAGNOSIS — R440 Auditory hallucinations: Secondary | ICD-10-CM | POA: Diagnosis not present

## 2018-07-08 DIAGNOSIS — Z5320 Procedure and treatment not carried out because of patient's decision for unspecified reasons: Secondary | ICD-10-CM | POA: Diagnosis not present

## 2018-07-08 DIAGNOSIS — G8929 Other chronic pain: Secondary | ICD-10-CM | POA: Diagnosis present

## 2018-07-08 LAB — RAPID URINE DRUG SCREEN, HOSP PERFORMED
Amphetamines: POSITIVE — AB
Barbiturates: NOT DETECTED
Benzodiazepines: NOT DETECTED
Cocaine: NOT DETECTED
Opiates: NOT DETECTED
Tetrahydrocannabinol: NOT DETECTED

## 2018-07-08 LAB — COMPREHENSIVE METABOLIC PANEL
ALT: 25 U/L (ref 0–44)
AST: 17 U/L (ref 15–41)
Albumin: 3.8 g/dL (ref 3.5–5.0)
Alkaline Phosphatase: 138 U/L — ABNORMAL HIGH (ref 38–126)
Anion gap: 12 (ref 5–15)
BUN: 8 mg/dL (ref 6–20)
CO2: 21 mmol/L — ABNORMAL LOW (ref 22–32)
Calcium: 9.4 mg/dL (ref 8.9–10.3)
Chloride: 108 mmol/L (ref 98–111)
Creatinine, Ser: 0.79 mg/dL (ref 0.44–1.00)
GFR calc Af Amer: >60 mL/min (ref >60)
GFR calc non Af Amer: >60 mL/min (ref >60)
Glucose, Bld: 111 mg/dL — ABNORMAL HIGH (ref 70–99)
Potassium: 3.7 mmol/L (ref 3.5–5.1)
Sodium: 141 mmol/L (ref 135–145)
Total Bilirubin: 0.5 mg/dL (ref 0.3–1.2)
Total Protein: 7.2 g/dL (ref 6.5–8.1)

## 2018-07-08 LAB — CBC
HCT: 43.6 % (ref 36.0–46.0)
Hemoglobin: 14 g/dL (ref 12.0–15.0)
MCH: 28.5 pg (ref 26.0–34.0)
MCHC: 32.1 g/dL (ref 30.0–36.0)
MCV: 88.8 fL (ref 80.0–100.0)
Platelets: 346 10*3/uL (ref 150–400)
RBC: 4.91 MIL/uL (ref 3.87–5.11)
RDW: 14 % (ref 11.5–15.5)
WBC: 11.5 10*3/uL — ABNORMAL HIGH (ref 4.0–10.5)
nRBC: 0 % (ref 0.0–0.2)

## 2018-07-08 LAB — I-STAT BETA HCG BLOOD, ED (MC, WL, AP ONLY): I-stat hCG, quantitative: <5 m[IU]/mL (ref <5)

## 2018-07-08 LAB — ETHANOL: Alcohol, Ethyl (B): <10 mg/dL (ref <10)

## 2018-07-08 LAB — ACETAMINOPHEN LEVEL: Acetaminophen (Tylenol), Serum: <10 ug/mL — ABNORMAL LOW (ref 10–30)

## 2018-07-08 LAB — SALICYLATE LEVEL: Salicylate Lvl: <7 mg/dL (ref 2.8–30.0)

## 2018-07-08 MED ORDER — PRAZOSIN HCL 2 MG PO CAPS
2.0000 mg | ORAL_CAPSULE | Freq: Every day | ORAL | Status: DC
  Administered 2018-07-08: 2 mg via ORAL
  Filled 2018-07-08: qty 1
  Filled 2018-07-08: qty 2

## 2018-07-08 MED ORDER — ALUM & MAG HYDROXIDE-SIMETH 200-200-20 MG/5ML PO SUSP
30.0000 mL | ORAL | Status: DC | PRN
  Administered 2018-07-10: 30 mL via ORAL
  Filled 2018-07-08: qty 30

## 2018-07-08 MED ORDER — LURASIDONE HCL 80 MG PO TABS
80.0000 mg | ORAL_TABLET | Freq: Every day | ORAL | Status: DC
  Administered 2018-07-09: 80 mg via ORAL
  Filled 2018-07-08: qty 1
  Filled 2018-07-08: qty 2

## 2018-07-08 MED ORDER — VORTIOXETINE HBR 10 MG PO TABS
10.0000 mg | ORAL_TABLET | Freq: Every day | ORAL | Status: DC
  Administered 2018-07-09: 10 mg via ORAL
  Filled 2018-07-08 (×4): qty 1

## 2018-07-08 MED ORDER — ATORVASTATIN CALCIUM 20 MG PO TABS
20.0000 mg | ORAL_TABLET | Freq: Every day | ORAL | Status: DC
  Administered 2018-07-09: 20 mg via ORAL
  Filled 2018-07-08: qty 1
  Filled 2018-07-08 (×2): qty 2

## 2018-07-08 MED ORDER — ARIPIPRAZOLE 5 MG PO TABS
5.0000 mg | ORAL_TABLET | Freq: Every day | ORAL | Status: DC
  Administered 2018-07-09 – 2018-07-10 (×2): 5 mg via ORAL
  Filled 2018-07-08 (×5): qty 1

## 2018-07-08 MED ORDER — MAGNESIUM HYDROXIDE 400 MG/5ML PO SUSP: 30.0000 mL | Freq: Every day | ORAL | Status: DC | PRN

## 2018-07-08 MED ORDER — ACETAMINOPHEN 325 MG PO TABS
650.0000 mg | ORAL_TABLET | Freq: Four times a day (QID) | ORAL | Status: DC | PRN
  Administered 2018-07-08 – 2018-07-14 (×7): 650 mg via ORAL
  Filled 2018-07-08 (×7): qty 2

## 2018-07-08 MED ORDER — TRAZODONE HCL 100 MG PO TABS
100.0000 mg | ORAL_TABLET | Freq: Every day | ORAL | Status: DC
  Administered 2018-07-08: 100 mg via ORAL
  Filled 2018-07-08 (×2): qty 1

## 2018-07-08 MED ORDER — FLUOXETINE HCL 20 MG PO CAPS
20.0000 mg | ORAL_CAPSULE | Freq: Every day | ORAL | Status: DC
  Administered 2018-07-09: 20 mg via ORAL
  Filled 2018-07-08 (×3): qty 1

## 2018-07-08 NOTE — ED Notes (Signed)
Voluntary consent

## 2018-07-08 NOTE — ED Notes (Signed)
[facility] arrived to transport

## 2018-07-08 NOTE — ED Provider Notes (Signed)
[facility] Provider Note   CSN: [id number] Arrival date & time: 07/08/18  1308    History   Chief Complaint Chief Complaint  Patient presents with  . Suicidal    HPI Montez MoritaQueenie Rhea Melecio is a 23 y.o. female with history of ADHD, autism, bipolar disorder, personality disorder, PTSD presenting with chief complaint of weakness, suicidal, and homicidal ideations x2 weeks. Pt reports feeling weak and not like her usual self.  She says overall he just does not care about doing things.  She also reports to being suicidal and homicidal.  She states she wants to kill her roommate's dog.  She has a plan to stab it with knives and states every time she sees it she thinks about following through with this.  She also states if she could overdose on pills she would.  Patient also admits to being homicidal but will not be forthcoming with the plan.  She has been out of her antidepressants for the last 2 weeks due to financial troubles. Pt also admits to auditory hallucination and loss of interest in daily activities for awhile now.  Patient states she had a suicide attempt back in January when she attempted to cut her wrists down enough so she would bleed to death.  She denies any drug or alcohol use.  Patient does admit to taking of her Adderall for ADHD.  Patient reports she only finds pleasure when hurting things.  She openly states she cannot wait until her mother is ill and needs to be cared for because she will torture her until her death.  She denies any fever, chills, chest pain, shortness of breath, nausea, vomiting, abdominal pain, diarrhea, urinary symptoms.  She denies any sick contacts.  She denies any known contact with person positive with COVID-19.   Past Medical History:  Diagnosis Date  . ADHD   . Autism   . Bipolar disorder (HCC)   . Chronic back pain    "mostly lower; sometimes all over" (03/22/2017)  . Chronic stomach ulcer   . Depression    . GERD (gastroesophageal reflux disease)   . Headache    "a few/week" (03/22/2017)  . Migraine    "monthly recently" (03/22/2017)  . Personality disorder (HCC)   . PTSD (post-traumatic stress disorder)   . Seizures (HCC)    "very random; I lose memory of what happens; usually happens when I'm in bed; probably 2-3/year" (03/22/2017)    Patient Active Problem List   Diagnosis Date Noted  . Schizoaffective disorder, depressive type (HCC) 01/26/2018  . Borderline personality disorder (HCC) 12/20/2016  . Schizoaffective disorder (HCC) 12/19/2016    History reviewed. No pertinent surgical history.   OB History   No obstetric history on file.      Home Medications    Prior to Admission medications   Medication Sig Start Date End Date Taking? Authorizing Provider  amphetamine-dextroamphetamine (ADDERALL) 30 MG tablet Take 30 mg by mouth 2 (two) times daily.    Yes [provider]  atorvastatin (LIPITOR) 20 MG tablet Take 20 mg by mouth at bedtime.    Yes [provider]  Etonogestrel (IMPLANON [place]) Inject 1 each into the skin once.   Yes [provider]  ibuprofen (ADVIL) 200 MG tablet Take 400 mg by mouth every 6 (six) hours as needed for headache (pain).   Yes [provider]  Lurasidone HCl 120 MG TABS Take 120 mg by mouth at bedtime.    Yes  [provider]  Multiple Vitamin (MULTIVITAMIN WITH MINERALS) TABS tablet Take 1 tablet by mouth daily.   Yes [provider]  pantoprazole (PROTONIX) 40 MG tablet Take 1 tablet (40 mg total) by mouth daily. For acid reflux 01/31/18  Yes Nwoko, Nicole Kindred I, NP  prazosin (MINIPRESS) 2 MG capsule Take 2 mg by mouth at bedtime.   Yes [provider]  traZODone (DESYREL) 100 MG tablet Take 100 mg by mouth at bedtime.   Yes [provider]  vortioxetine HBr (TRINTELLIX) 20 MG TABS tablet Take 20 mg by mouth daily.    Yes [provider]  ARIPiprazole (ABILIFY) 5 MG tablet  Take 1 tablet (5 mg total) by mouth daily. For mood control Patient not taking: Reported on 07/08/2018 02/01/18   Armandina Stammer I, NP  FLUoxetine (PROZAC) 20 MG capsule Take 1 capsule (20 mg total) by mouth daily. For depression Patient not taking: Reported on 07/08/2018 02/01/18   Armandina Stammer I, NP  naproxen (NAPROSYN) 500 MG tablet Take 1 tablet (500 mg total) by mouth 2 (two) times daily. Patient not taking: Reported on 07/08/2018 06/19/18   Horton, Mayer Masker, MD  Prenatal Vit-Fe Fumarate-FA (PRENATAL MULTIVITAMIN) TABS tablet Take 1 tablet by mouth daily at 12 noon. Vitamin supplement Patient not taking: Reported on 03/14/2018 01/31/18   Sanjuana Kava, NP    Family History Family History  Problem Relation Age of Onset  . Diabetes Mother   . Hypertension Maternal Grandmother   . Hyperlipidemia Maternal Grandmother   . Diabetes Maternal Grandmother     Social History Social History   Tobacco Use  . Smoking status: Never Smoker  . Smokeless tobacco: Never Used  Substance Use Topics  . Alcohol use: No    Alcohol/week: 0.0 standard drinks  . Drug use: No     Allergies   Asa [aspirin]; Pork-derived products; and Lithium   Review of Systems Review of Systems  Constitutional: Negative for chills and fever.  HENT: Negative for congestion, ear discharge, ear pain, sinus pressure, sinus pain and sore throat.   Eyes: Negative for pain and redness.  Respiratory: Negative for cough and shortness of breath.   Cardiovascular: Negative for chest pain.  Gastrointestinal: Negative for abdominal pain, constipation, diarrhea, nausea and vomiting.  Genitourinary: Negative for dysuria and hematuria.  Musculoskeletal: Negative for back pain and neck pain.  Skin: Negative for wound.  Neurological: Negative for weakness, numbness and headaches.  Psychiatric/Behavioral: Positive for hallucinations and suicidal ideas.     Physical Exam Updated Vital Signs BP 101/78 (BP Location: Right Arm)    Pulse (!) 108   Temp 98.4 F (36.9 C) (Oral)   Resp 18   Ht  (1.575 m)   Wt 117.9 kg   SpO2 100%   BMI 47.55 kg/m   Physical Exam Vitals signs and nursing note reviewed.  HENT:     Head: Normocephalic and atraumatic.     Nose: Nose normal.     Mouth/Throat:     Mouth: Mucous membranes are moist.     Pharynx: Oropharynx is clear.  Eyes:     General: No scleral icterus.    Conjunctiva/sclera: Conjunctivae normal.  Neck:     Musculoskeletal: Normal range of motion. No muscular tenderness.  Cardiovascular:     Rate and Rhythm: Regular rhythm. Tachycardia present.     Pulses: Normal pulses.          Radial pulses are 2+ on the right side and 2+  on the left side.     Heart sounds: Normal heart sounds.  Pulmonary:     Effort: Pulmonary effort is normal.     Breath sounds: Normal breath sounds. No wheezing, rhonchi or rales.  Abdominal:     General: There is no distension.     Palpations: Abdomen is soft.     Tenderness: There is no abdominal tenderness. There is no guarding or rebound.  Musculoskeletal: Normal range of motion.  Skin:    General: Skin is warm and dry.     Capillary Refill: Capillary refill takes less than 2 seconds.     Findings: No rash.  Neurological:     Mental Status: She is alert and oriented to person, place, and time.  Psychiatric:        Attention and Perception: She perceives auditory hallucinations.        Thought Content: Thought content includes homicidal and suicidal ideation. Thought content includes homicidal and suicidal plan.      ED Treatments / Results  Labs (all labs ordered are listed, but only abnormal results are displayed) Labs Reviewed  COMPREHENSIVE METABOLIC PANEL - Abnormal; Notable for the following components:      Result Value   CO2 21 (*)    Glucose, Bld 111 (*)    Alkaline Phosphatase 138 (*)    All other components within normal limits  ACETAMINOPHEN LEVEL - Abnormal; Notable for the following components:    Acetaminophen (Tylenol), Serum <10 (*)    All other components within normal limits  CBC - Abnormal; Notable for the following components:   WBC 11.5 (*)    All other components within normal limits  RAPID URINE DRUG SCREEN, HOSP PERFORMED - Abnormal; Notable for the following components:   Amphetamines POSITIVE (*)    All other components within normal limits  ETHANOL  SALICYLATE LEVEL  I-STAT BETA HCG BLOOD, ED ([facility], [facility], [facility] ONLY)    EKG None  Radiology No results found.  Procedures Procedures (including critical care time)  Medications Ordered in ED Medications - No data to display   Initial Impression / Assessment and Plan / ED Course  I have reviewed the triage vital signs and the nursing notes.  Pertinent labs & imaging results that were available during my care of the patient were reviewed by me and considered in my medical decision making (see chart for details).  23 year old female with history of ADHD, autism, bipolar disorder, personality disorder, PTSD presenting with both suicidal and homicidal ideations x 2 weeks. On arrival she is tachycardic to 121, overall well appearing. Pt given PO fluids and heart rate improved to 108, she continues to appear anxious. She has plans to harm herself by overdosing on pills, kill her roommate's dog by stabbing, and hurt kill other people. She has been off her antidepressants for 2 weeks.   Her lab work today so mild leukocytosis of 11.5, however she is afebrile with no cough, or URI symptoms doubt infectious source. CMP, ethanol, salicylate, acetaminophen level unremarkable. UDS positive for amphetamines, pt takes aderall. Pt is medically clear.  TTS has evaluated the patient and recommending overnight observation.  Patient has been accepted to [facility] obs unit.  This note was prepared with assistance of Conservation officer, historic buildings. Occasional wrong-word or sound-a-like substitutions may have occurred due to the inherent  limitations of voice recognition software.     Final Clinical Impressions(s) / ED Diagnoses   Final diagnoses:  Suicidal ideation  Homicidal ideations  ED Discharge Orders    None       Kathyrn Lass 07/08/18 1743    Maia Plan, MD 07/08/18 2108

## 2018-07-08 NOTE — ED Notes (Signed)
Pt given a bag lunch and gingerale. 

## 2018-07-08 NOTE — ED Notes (Signed)
Observation unit at [facility] called to give report

## 2018-07-08 NOTE — H&P (Signed)
[facility] Observation Unit Provider Admission PAA/H&P  Patient Identification: Tamara [name] MRN:  [id number] Date of Evaluation:  07/08/2018 Chief Complaint:  Bipolar 1 disorder Principal Diagnosis: Schizoaffective disorder (HCC) Diagnosis:  Principal Problem:   Schizoaffective disorder (HCC) Active Problems:   Borderline personality disorder (HCC)  History of Present Illness: Pt presented to [facility] stating she was suicidal but would not specify a plan and stated she homicidal toward her roommates dog and had a plan to stab the dog because it barks all the time. Pt is pansexual and prefers they/them. She is guarded about her issues. She is seen at [facility] for counseling and medication management but stated she stopped her medications 2 weeks ago due to financial issues. She lives with roommates and is taking a gap year form [employer]. She denies drug and alcohol use. Her UDS is positive for amphetmines (prescribed), BAL negative. She is alert and oriented x 4, calm and cooperative but guarded. Her insight is limited and she seems focused on her roommates and the dog instead of the real issue of her mental health. Pt will remain in the observation unit overnight and be restarted on her home medications.   Associated Signs/Symptoms: Depression Symptoms:  depressed mood, anhedonia, difficulty concentrating, suicidal thoughts without plan, loss of energy/fatigue, (Hypo) Manic Symptoms:  Distractibility, Irritable Mood, Anxiety Symptoms:  Excessive Worry, Panic Symptoms, Psychotic Symptoms:  N/A PTSD Symptoms: Negative Total Time spent with patient: 30 minutes  Past Psychiatric History: Bipolar disorder  Is the patient at risk to self? Yes.    Has the patient been a risk to self in the past 6 months? No.  Has the patient been a risk to self within the distant past? Yes.    Is the patient a risk to others? Yes.    Has the patient been a risk to others in the past 6 months? No.  Has  the patient been a risk to others within the distant past? No.   Prior Inpatient Therapy:   Prior Outpatient Therapy:    Alcohol Screening:   Substance Abuse History in the last 12 months:  No. Consequences of Substance Abuse: Negative Previous Psychotropic Medications: Yes  Psychological Evaluations: No  Past Medical History:  Past Medical History:  Diagnosis Date  . ADHD   . Autism   . Bipolar disorder (HCC)   . Chronic back pain    "mostly lower; sometimes all over" (03/22/2017)  . Chronic stomach ulcer   . Depression   . GERD (gastroesophageal reflux disease)   . Headache    "a few/week" (03/22/2017)  . Migraine    "monthly recently" (03/22/2017)  . Personality disorder (HCC)   . PTSD (post-traumatic stress disorder)   . Seizures (HCC)    "very random; I lose memory of what happens; usually happens when I'm in bed; probably 2-3/year" (03/22/2017)   No past surgical history on file. Family History:  Family History  Problem Relation Age of Onset  . Diabetes Mother   . Hypertension Maternal Grandmother   . Hyperlipidemia Maternal Grandmother   . Diabetes Maternal Grandmother    Family Psychiatric History: Pt did not disclose Tobacco Screening:   Social History:  Social History   Substance and Sexual Activity  Alcohol Use No  . Alcohol/week: 0.0 standard drinks     Social History   Substance and Sexual Activity  Drug Use No    Additional Social History:        Allergies:   Allergies  Allergen  Reactions  . Asa [Aspirin] Anaphylaxis  . Pork-Derived Products Shortness Of Breath  . Lithium Hives   Lab Results:  Results for orders placed or performed during the hospital encounter of 07/08/18 (from the past 48 hour(s))  Rapid urine drug screen (hospital performed)     Status: Abnormal   Collection Time: 07/08/18  1:32 PM  Result Value Ref Range   Opiates NONE DETECTED NONE DETECTED   Cocaine NONE DETECTED NONE DETECTED   Benzodiazepines NONE DETECTED NONE  DETECTED   Amphetamines POSITIVE (A) NONE DETECTED   Tetrahydrocannabinol NONE DETECTED NONE DETECTED   Barbiturates NONE DETECTED NONE DETECTED    Comment: (NOTE) DRUG SCREEN FOR MEDICAL PURPOSES ONLY.  IF CONFIRMATION IS NEEDED FOR ANY PURPOSE, NOTIFY LAB WITHIN 5 DAYS. LOWEST DETECTABLE LIMITS FOR URINE DRUG SCREEN Drug Class                     Cutoff (ng/mL) Amphetamine and metabolites    1000 Barbiturate and metabolites    200 Benzodiazepine                 200 Tricyclics and metabolites     300 Opiates and metabolites        300 Cocaine and metabolites        300 THC                            50 Performed at [facility] Lab, [address], [place], [place] [address]   Comprehensive metabolic panel     Status: Abnormal   Collection Time: 07/08/18  1:38 PM  Result Value Ref Range   Sodium 141 135 - 145 mmol/L   Potassium 3.7 3.5 - 5.1 mmol/L   Chloride 108 98 - 111 mmol/L   CO2 21 (L) 22 - 32 mmol/L   Glucose, Bld 111 (H) 70 - 99 mg/dL   BUN 8 6 - 20 mg/dL   Creatinine, Ser 6.29 0.44 - 1.00 mg/dL   Calcium 9.4 8.9 - 47.6 mg/dL   Total Protein 7.2 6.5 - 8.1 g/dL   Albumin 3.8 3.5 - 5.0 g/dL   AST 17 15 - 41 U/L   ALT 25 0 - 44 U/L   Alkaline Phosphatase 138 (H) 38 - 126 U/L   Total Bilirubin 0.5 0.3 - 1.2 mg/dL   GFR calc non Af Amer >60 >60 mL/min   GFR calc Af Amer >60 >60 mL/min   Anion gap 12 5 - 15    Comment: Performed at [facility], [address], [place], [place] [address]  Ethanol     Status: None   Collection Time: 07/08/18  1:38 PM  Result Value Ref Range   Alcohol, Ethyl (B) <10 <10 mg/dL    Comment: (NOTE) Lowest detectable limit for serum alcohol is 10 mg/dL. For medical purposes only. Performed at [facility], [address] [place], [place] [address]   Salicylate level     Status: None   Collection Time: 07/08/18  1:38 PM  Result Value Ref Range   Salicylate Lvl <7.0 2.8 - 30.0 mg/dL    Comment: Performed at [facility], [address], [place], [place] [address]  Acetaminophen level     Status: Abnormal   Collection Time: 07/08/18  1:38 PM  Result Value Ref Range   Acetaminophen (Tylenol), Serum <10 (L) 10 - 30 ug/mL  Comment: (NOTE) Therapeutic concentrations vary significantly. A range of 10-30 ug/mL  may be an effective concentration for many patients. However, some  are best treated at concentrations outside of this range. Acetaminophen concentrations >150 ug/mL at 4 hours after ingestion  and >50 ug/mL at 12 hours after ingestion are often associated with  toxic reactions. Performed at [facility], [address] [place], [place] [address]   cbc     Status: Abnormal   Collection Time: 07/08/18  1:38 PM  Result Value Ref Range   WBC 11.5 (H) 4.0 - 10.5 K/uL   RBC 4.91 3.87 - 5.11 MIL/uL   Hemoglobin 14.0 12.0 - 15.0 g/dL   HCT 60.443.6 54.036.0 - 98.146.0 %   MCV 88.8 80.0 - 100.0 fL   MCH 28.5 26.0 - 34.0 pg   MCHC 32.1 30.0 - 36.0 g/dL   RDW 19.114.0 47.811.5 - 29.515.5 %   Platelets 346 150 - 400 K/uL   nRBC 0.0 0.0 - 0.2 %    Comment: Performed at [facility], [address] [place], [place] [address]  I-Stat beta hCG blood, ED     Status: None   Collection Time: 07/08/18  2:07 PM  Result Value Ref Range   I-stat hCG, quantitative <5.0 <5 mIU/mL   Comment 3            Comment:   GEST. AGE      CONC.  (mIU/mL)   <=1 WEEK        5 - 50     2 WEEKS       50 - 500     3 WEEKS       100 - 10,000     4 WEEKS     1,000 - 30,000        FEMALE AND NON-PREGNANT FEMALE:     LESS THAN 5 mIU/mL     Blood Alcohol level:  Lab Results  Component Value Date   ETH <10 07/08/2018   ETH <10 03/14/2018    Metabolic Disorder Labs:  Lab Results  Component Value Date   HGBA1C 5.2 01/27/2018   MPG 102.54 01/27/2018   Lab Results  Component Value Date   PROLACTIN 94.3 (H) 01/27/2018   Lab Results  Component Value Date   CHOL 137 01/27/2018   TRIG 147 01/27/2018   HDL 40 (L)  01/27/2018   CHOLHDL 3.4 01/27/2018   VLDL 29 01/27/2018   LDLCALC 68 01/27/2018    Current Medications: No current facility-administered medications for this encounter.    PTA Medications: Medications Prior to Admission  Medication Sig Dispense Refill Last Dose  . amphetamine-dextroamphetamine (ADDERALL) 30 MG tablet Take 30 mg by mouth 2 (two) times daily.    07/08/2018 at am  . ARIPiprazole (ABILIFY) 5 MG tablet Take 1 tablet (5 mg total) by mouth daily. For mood control (Patient not taking: Reported on 07/08/2018) 30 tablet 0 Not Taking at Unknown time  . atorvastatin (LIPITOR) 20 MG tablet Take 20 mg by mouth at bedtime.    07/07/2018 at pm  . Etonogestrel (IMPLANON [place]) Inject 1 each into the skin once.   3 years ago  . FLUoxetine (PROZAC) 20 MG capsule Take 1 capsule (20 mg total) by mouth daily. For depression (Patient not taking: Reported on 07/08/2018) 30 capsule 0 Not Taking at Unknown time  . ibuprofen (ADVIL) 200 MG tablet Take 400 mg by mouth every 6 (six) hours as needed for headache (  pain).   few days ago  . Lurasidone HCl 120 MG TABS Take 120 mg by mouth at bedtime.    07/07/2018 at pm  . Multiple Vitamin (MULTIVITAMIN WITH MINERALS) TABS tablet Take 1 tablet by mouth daily.   07/08/2018 at am  . naproxen (NAPROSYN) 500 MG tablet Take 1 tablet (500 mg total) by mouth 2 (two) times daily. (Patient not taking: Reported on 07/08/2018) 30 tablet 0 Not Taking at Unknown time  . pantoprazole (PROTONIX) 40 MG tablet Take 1 tablet (40 mg total) by mouth daily. For acid reflux 15 tablet 0 07/08/2018 at am  . prazosin (MINIPRESS) 2 MG capsule Take 2 mg by mouth at bedtime.   07/07/2018 at pm  . Prenatal Vit-Fe Fumarate-FA (PRENATAL MULTIVITAMIN) TABS tablet Take 1 tablet by mouth daily at 12 noon. Vitamin supplement (Patient not taking: Reported on 03/14/2018)   Not Taking at Unknown time  . traZODone (DESYREL) 100 MG tablet Take 100 mg by mouth at bedtime.   07/07/2018 at pm  . vortioxetine HBr  (TRINTELLIX) 20 MG TABS tablet Take 20 mg by mouth daily.    07/08/2018 at am    Musculoskeletal: Strength & Muscle Tone: within normal limits Gait & Station: not tested Patient leans: N/A  Psychiatric Specialty Exam: Physical Exam  Constitutional: She is oriented to person, place, and time. She appears well-developed and well-nourished.  HENT:  Head: Normocephalic.  Respiratory: Effort normal.  Neurological: She is alert and oriented to person, place, and time.    Review of Systems  Psychiatric/Behavioral: Positive for depression. The patient is nervous/anxious.   All other systems reviewed and are negative.   Blood pressure (!) 113/93, pulse (!) 118, temperature 98 F (36.7 C), temperature source Oral, resp. rate 18, SpO2 100 %.There is no height or weight on file to calculate BMI.  General Appearance: Casual  Eye Contact:  Good  Speech:  Clear and Coherent and Normal Rate  Volume:  Normal  Mood:  Anxious, Depressed and Irritable  Affect:  Congruent and Depressed  Thought Process:  Coherent and Descriptions of Associations: Intact  Orientation:  Full (Time, Place, and Person)  Thought Content:  Logical  Suicidal Thoughts:  No  Homicidal Thoughts:  Yes.  with intent/plan  Memory:  Immediate;   Good Recent;   Good Remote;   Fair  Judgement:  Poor  Insight:  Shallow  Psychomotor Activity:  Normal  Concentration:  Concentration: Fair  Recall:  [employer] of Knowledge:  Fair  Language:  Good  Akathisia:  No  Handed:  Right  AIMS (if indicated):     Assets:  Architect Housing Social Support Vocational/Educational  ADL's:  Intact  Cognition:  WNL  Sleep:       Treatment Plan Summary: Daily contact with patient to assess and evaluate symptoms and progress in treatment, Medication management and Plan Admit to [facility] OBS unit for safety and restart home medications  Observation Level/Precautions:  15 minute checks Laboratory:   Labs reviewed.  CBC, CMET, UDS, I-stat hCG quant negative Psychotherapy:  TBD Medications:  See [facility]  Discharge Concerns:  Safety and ability to afford medications Estimated LOS: 24-48 hours     Laveda Abbe, NP 5/1/20206:32 PM

## 2018-07-08 NOTE — Progress Notes (Signed)
Patient ID: Tamara [name], female   DOB: 08-Apr-1995, 23 y.o.   MRN: [id number]  Pt has been accepted to [facility] OBS unit for overnight observation and to restart on home medications. May transport by [facility].   Laveda Abbe, FNP-C 07/08/2018      1704

## 2018-07-08 NOTE — Discharge Instructions (Addendum)
Transfer to [facility] 

## 2018-07-08 NOTE — ED Notes (Signed)
Esignature not available. 

## 2018-07-08 NOTE — ED Notes (Signed)
[facility] called to request transport
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
## 2018-07-08 NOTE — Progress Notes (Signed)
Patient ID: Tamara [name], female   DOB: August 26, 1995, 23 y.o.   MRN: [id number] Pt A&O x 3, resting in bed, no distress noted, pt remains SI, plan to stab self with knife.  Pt verbally contracts for safety.  Denies HI or AVH.  Monitoring for safety, Q 15 min checks in effect.

## 2018-07-08 NOTE — BH Assessment (Signed)
Patient reports increased depression, suicidal ideation and increased anxiety due to not being able to afford medications and her living situation.  Patient states that she is triggered by her roommates dog consistently barking and her not being able to rest. Skin assessment complete, patient found to be free of all injury and contraband.  All personal belongings stored in locker #8.

## 2018-07-08 NOTE — ED Triage Notes (Signed)
Pt BIB [facility] for eval of suicidal ideations and generalized body aches. Pt reports that she stopped taking her antidepressants d/t financial constraints, however reported to EMS that it was due to nausea. States she stopped abruptly 2-3 weeks. Pt reports that she has also had increased hallucinations w/ loss of interest in daily activities. Pt reports she is gender fluid and prefers they/them.

## 2018-07-08 NOTE — BH Assessment (Signed)
[facility] Assessment Progress Note Case was staffed with Parks FNP who recommended patient be observed and monitored.             

## 2018-07-09 DIAGNOSIS — G4700 Insomnia, unspecified: Secondary | ICD-10-CM | POA: Diagnosis present

## 2018-07-09 DIAGNOSIS — F515 Nightmare disorder: Secondary | ICD-10-CM | POA: Diagnosis present

## 2018-07-09 DIAGNOSIS — Z5320 Procedure and treatment not carried out because of patient's decision for unspecified reasons: Secondary | ICD-10-CM | POA: Diagnosis not present

## 2018-07-09 DIAGNOSIS — Z915 Personal history of self-harm: Secondary | ICD-10-CM | POA: Diagnosis not present

## 2018-07-09 DIAGNOSIS — F259 Schizoaffective disorder, unspecified: Secondary | ICD-10-CM

## 2018-07-09 DIAGNOSIS — G8929 Other chronic pain: Secondary | ICD-10-CM | POA: Diagnosis present

## 2018-07-09 DIAGNOSIS — Z79899 Other long term (current) drug therapy: Secondary | ICD-10-CM | POA: Diagnosis not present

## 2018-07-09 DIAGNOSIS — F319 Bipolar disorder, unspecified: Secondary | ICD-10-CM | POA: Diagnosis not present

## 2018-07-09 DIAGNOSIS — F840 Autistic disorder: Secondary | ICD-10-CM | POA: Diagnosis present

## 2018-07-09 DIAGNOSIS — F314 Bipolar disorder, current episode depressed, severe, without psychotic features: Secondary | ICD-10-CM | POA: Diagnosis present

## 2018-07-09 DIAGNOSIS — F3132 Bipolar disorder, current episode depressed, moderate: Secondary | ICD-10-CM | POA: Diagnosis present

## 2018-07-09 DIAGNOSIS — Z79891 Long term (current) use of opiate analgesic: Secondary | ICD-10-CM | POA: Diagnosis not present

## 2018-07-09 DIAGNOSIS — Z833 Family history of diabetes mellitus: Secondary | ICD-10-CM | POA: Diagnosis not present

## 2018-07-09 DIAGNOSIS — Z62819 Personal history of unspecified abuse in childhood: Secondary | ICD-10-CM | POA: Diagnosis present

## 2018-07-09 DIAGNOSIS — K219 Gastro-esophageal reflux disease without esophagitis: Secondary | ICD-10-CM | POA: Diagnosis present

## 2018-07-09 DIAGNOSIS — F603 Borderline personality disorder: Secondary | ICD-10-CM | POA: Diagnosis present

## 2018-07-09 DIAGNOSIS — E785 Hyperlipidemia, unspecified: Secondary | ICD-10-CM | POA: Diagnosis present

## 2018-07-09 DIAGNOSIS — Z8249 Family history of ischemic heart disease and other diseases of the circulatory system: Secondary | ICD-10-CM | POA: Diagnosis not present

## 2018-07-09 DIAGNOSIS — R45851 Suicidal ideations: Secondary | ICD-10-CM | POA: Diagnosis present

## 2018-07-09 DIAGNOSIS — Z888 Allergy status to other drugs, medicaments and biological substances status: Secondary | ICD-10-CM | POA: Diagnosis not present

## 2018-07-09 DIAGNOSIS — Z91018 Allergy to other foods: Secondary | ICD-10-CM | POA: Diagnosis not present

## 2018-07-09 DIAGNOSIS — F909 Attention-deficit hyperactivity disorder, unspecified type: Secondary | ICD-10-CM | POA: Diagnosis present

## 2018-07-09 DIAGNOSIS — Z886 Allergy status to analgesic agent status: Secondary | ICD-10-CM | POA: Diagnosis not present

## 2018-07-09 DIAGNOSIS — F4310 Post-traumatic stress disorder, unspecified: Secondary | ICD-10-CM | POA: Diagnosis present

## 2018-07-09 MED ORDER — PANTOPRAZOLE SODIUM 20 MG PO TBEC
20.0000 mg | DELAYED_RELEASE_TABLET | Freq: Every day | ORAL | Status: DC
  Administered 2018-07-10: 20 mg via ORAL
  Filled 2018-07-09 (×3): qty 1

## 2018-07-09 MED ORDER — TRAZODONE HCL 50 MG PO TABS
50.0000 mg | ORAL_TABLET | Freq: Every evening | ORAL | Status: DC | PRN
  Administered 2018-07-09: 50 mg via ORAL
  Filled 2018-07-09: qty 1

## 2018-07-09 MED ORDER — LORAZEPAM 0.5 MG PO TABS: 0.5000 mg | ORAL_TABLET | Freq: Four times a day (QID) | ORAL | Status: DC | PRN

## 2018-07-09 MED ORDER — FLUOXETINE HCL 10 MG PO CAPS
10.0000 mg | ORAL_CAPSULE | Freq: Every day | ORAL | Status: DC
  Administered 2018-07-10: 10 mg via ORAL
  Filled 2018-07-09 (×3): qty 1

## 2018-07-09 NOTE — Plan of Care (Signed)
  Problem: Education: Goal: Knowledge of [place] [facility] Education information/materials will improve Outcome: Progressing Goal: Emotional status will improve Outcome: Progressing Goal: Verbalization of understanding the information provided will improve Outcome: Progressing   Problem: Activity: Goal: Interest or engagement in activities will improve Outcome: Progressing   

## 2018-07-09 NOTE — Progress Notes (Signed)
Pt remains sleeping at present, no complaints voiced, no distress noted.  Monitoring for safety.
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
## 2018-07-09 NOTE — Progress Notes (Signed)
Patient transferred from OBs- Admission paperwork completed and signed - Verbal understanding expressed. Patient provided with toiletries, and personal pitcher of water. Pt remains safe on the unit with 15 min checks.

## 2018-07-09 NOTE — Progress Notes (Signed)
Adult Psychoeducational Group Note  Date:  07/09/2018 Time:  8:45 PM  Group Topic/Focus:  Wrap-Up Group:   The focus of this group is to help patients review their daily goal of treatment and discuss progress on daily workbooks.  Participation Level:  Active  Participation Quality:  Appropriate  Affect:  Appropriate  Cognitive:  Appropriate  Insight: Appropriate  Engagement in Group:  Engaged  Modes of Intervention:  Discussion  Additional Comments:  Pt rated her day 4/10. Pt felt the most at peace today while taking a nap. Her goal is to get her medications adjusted to feel less depressed.   Kaleen Odea R 07/09/2018, 8:45 PM

## 2018-07-09 NOTE — Discharge Summary (Addendum)
Physician Discharge Summary Note  Patient:  Tamara [name] is an 23 y.o., female MRN:  [id number] DOB:  [birth year] Patient phone:  [phone] (home)  Patient address:   [address] [place] [address],  Total Time spent with patient: 30 minutes  Date of Admission:  07/08/2018 Date of Discharge: 07/09/18  Reason for Admission:  Suicide threat  Principal Problem: bipolar affective disorder, depressed type Discharge Diagnoses:  Bipolar affective disorder, depressed type  Past Psychiatric History: PTSD, ADHD, bipolar d/o  Past Medical History:  Past Medical History:  Diagnosis Date  . ADHD   . Autism   . Bipolar disorder ([place])   . Chronic back pain    "mostly lower; sometimes all over" (03/22/2017)  . Chronic stomach ulcer   . Depression   . GERD (gastroesophageal reflux disease)   . Headache    "a few/week" (03/22/2017)  . Migraine    "monthly recently" (03/22/2017)  . Personality disorder ([place])   . PTSD (post-traumatic stress disorder)   . Seizures ([place])    "very random; I lose memory of what happens; usually happens when I'm in bed; probably 2-3/year" (03/22/2017)   History reviewed. No pertinent surgical history. Family History:  Family History  Problem Relation Age of Onset  . Diabetes Mother   . Hypertension Maternal Grandmother   . Hyperlipidemia Maternal Grandmother   . Diabetes Maternal Grandmother    Family Psychiatric  History: none Social History:  Social History   Substance and Sexual Activity  Alcohol Use No  . Alcohol/week: 0.0 standard drinks     Social History   Substance and Sexual Activity  Drug Use No    Social History   Socioeconomic History  . Marital status: Single    Spouse name: Not on file  . Number of children: Not on file  . Years of education: Not on file  . Highest education level: Not on file  Occupational History  . Not on file  Social Needs  . Financial resource strain: Not on file  . Food  insecurity:    Worry: Not on file    Inability: Not on file  . Transportation needs:    Medical: Not on file    Non-medical: Not on file  Tobacco Use  . Smoking status: Never Smoker  . Smokeless tobacco: Never Used  Substance and Sexual Activity  . Alcohol use: No    Alcohol/week: 0.0 standard drinks  . Drug use: No  . Sexual activity: Yes  Lifestyle  . Physical activity:    Days per week: Not on file    Minutes per session: Not on file  . Stress: Not on file  Relationships  . Social connections:    Talks on phone: Not on file    Gets together: Not on file    Attends religious service: Not on file    Active member of club or organization: Not on file    Attends meetings of clubs or organizations: Not on file    Relationship status: Not on file  Other Topics Concern  . Not on file  Social History Narrative  . Not on file    Hospital Course:  On admission 07/08/18:  Pt presented to [facility] stating she was suicidal but would not specify a plan and stated she homicidal toward her roommates dog and had a plan to stab the dog because it barks all the time. Pt is pansexual and prefers they/them. She is guarded about her issues.  She is seen at [facility] for counseling and medication management but stated she stopped her medications 2 weeks ago due to financial issues. She lives with roommates and is taking a gap year form [employer]. She denies drug and alcohol use. Her UDS is positive for amphetmines (prescribed), BAL negative. She is alert and oriented x 4, calm and cooperative but guarded. Her insight is limited and she seems focused on her roommates and the dog instead of the real issue of her mental health. Pt will remain in the observation unit overnight and be restarted on her home medications.   Medications:  Restarted Abilify 5 mg daily, Lipitor 20 mg at bedtime, Prozac 20 mg daily, Latuda 120 mg at bedtime, Prazosin 2 mg at bedtime, Protonix 40 mg daily, Trazodone 100 mg at bedtime,  and Trintellix 20 mg daily  07/09/18:  Patient had met maximum benefit of the observation unit and was not stable.  Still complaining of suicidal ideations and would not reveal a plan, admitted to [facility] 400 hall  Musculoskeletal: Strength & Muscle Tone: within normal limits Gait & Station: normal Patient leans: N/A  Psychiatric Specialty Exam: Physical Exam  Nursing note and vitals reviewed. Constitutional: She is oriented to person, place, and time. She appears well-developed and well-nourished.  HENT:  Head: Normocephalic.  Neck: Normal range of motion.  Respiratory: Effort normal.  Musculoskeletal: Normal range of motion.  Neurological: She is alert and oriented to person, place, and time.  Psychiatric: Her speech is normal and behavior is normal. Her mood appears anxious. Cognition and memory are normal. She expresses impulsivity. She exhibits a depressed mood. She expresses suicidal ideation. She expresses suicidal plans.    Review of Systems  Psychiatric/Behavioral: Positive for depression and suicidal ideas. The patient is nervous/anxious.   All other systems reviewed and are negative.   Blood pressure 101/78, pulse (!) 108, temperature 98.4 F (36.9 C), temperature source Oral, resp. rate 18, height '5\' 2"'$  (1.575 m), weight 117.9 kg, SpO2 100 %.Body mass index is 47.55 kg/m.  General Appearance: Casual  Eye Contact:  Good  Speech:  Normal Rate  Volume:  Normal  Mood:  Anxious and Depressed  Affect:  Congruent  Thought Process:  Coherent and Descriptions of Associations: Intact  Orientation:  Full (Time, Place, and Person)  Thought Content:  Rumination  Suicidal Thoughts:  Yes.  with intent/plan  Homicidal Thoughts:  No  Memory:  Immediate;   Fair Recent;   Fair Remote;   Fair  Judgement:  Poor  Insight:  Fair  Psychomotor Activity:  Decreased  Concentration:  Concentration: Fair and Attention Span: Fair  Recall:  [employer] of Knowledge:  Fair  Language:  Fair   Akathisia:  No  Handed:  Right  AIMS (if indicated):     Assets:  Leisure Time Physical Health Resilience Social Support  ADL's:  Intact  Cognition:  WNL  Sleep:           Has this patient used any form of tobacco in the last 30 days? (Cigarettes, Smokeless Tobacco, Cigars, and/or Pipes) Yes, No  Blood Alcohol level:  Lab Results  Component Value Date   ETH <10 07/08/2018   ETH <10 03/00/9233    Metabolic Disorder Labs:  Lab Results  Component Value Date   HGBA1C 5.2 01/27/2018   MPG 102.54 01/27/2018   Lab Results  Component Value Date   PROLACTIN 94.3 (H) 01/27/2018   Lab Results  Component Value Date   CHOL  137 01/27/2018   TRIG 147 01/27/2018   HDL 40 (L) 01/27/2018   CHOLHDL 3.4 01/27/2018   VLDL 29 01/27/2018   LDLCALC 68 01/27/2018    See Psychiatric Specialty Exam and Suicide Risk Assessment completed by Attending Physician prior to discharge.  Discharge destination:  Other:  inpatient psychiatric unit  Is patient on multiple antipsychotic therapies at discharge: yes Has Patient had three or more failed trials of antipsychotic monotherapy by history:  Yes but would not disclose  Recommended Plan for Multiple Antipsychotic Therapies: Adjust antipsychotic medications during the inpatient hospitalization   Allergies as of 07/08/2018      Reactions   Asa [aspirin] Anaphylaxis   Pork-derived Products Shortness Of Breath   Lithium Hives      Medication List    ASK your doctor about these medications     Indication  amphetamine-dextroamphetamine 30 MG tablet Commonly known as:  ADDERALL Take 30 mg by mouth 2 (two) times daily.    ARIPiprazole 5 MG tablet Commonly known as:  ABILIFY Take 1 tablet (5 mg total) by mouth daily. For mood control  Indication:  Mood control   atorvastatin 20 MG tablet Commonly known as:  LIPITOR Take 20 mg by mouth at bedtime.    FLUoxetine 20 MG capsule Commonly known as:  PROZAC Take 1 capsule (20 mg total)  by mouth daily. For depression  Indication:  Major Depressive Disorder   ibuprofen 200 MG tablet Commonly known as:  ADVIL Take 400 mg by mouth every 6 (six) hours as needed for headache (pain).    IMPLANON Secretary Inject 1 each into the skin once.    Lurasidone HCl 120 MG Tabs Take 120 mg by mouth at bedtime.    multivitamin with minerals Tabs tablet Take 1 tablet by mouth daily.    naproxen 500 MG tablet Commonly known as:  NAPROSYN Take 1 tablet (500 mg total) by mouth 2 (two) times daily.    pantoprazole 40 MG tablet Commonly known as:  PROTONIX Take 1 tablet (40 mg total) by mouth daily. For acid reflux  Indication:  Gastroesophageal Reflux Disease   prazosin 2 MG capsule Commonly known as:  MINIPRESS Take 2 mg by mouth at bedtime.    prenatal multivitamin Tabs tablet Take 1 tablet by mouth daily at 12 noon. Vitamin supplement  Indication:  Vitamin Deficiency   traZODone 100 MG tablet Commonly known as:  DESYREL Take 100 mg by mouth at bedtime.    Trintellix 20 MG Tabs tablet Generic drug:  vortioxetine HBr Take 20 mg by mouth daily.         Follow-up recommendations:   Bipolar affective disorder, depressed, severe without psychosis: -Continued Trintellix 20 mg daily -Continued Abilify 5 mg daily -Continued Latuda 120 mg at bedtime -Continued Prozac 20 mg daily  Insomnia -Continued Trazodone 100 mat bedtime  Nightmares: -Continued Prazosin 2 mg at bedtime  GERD:   -Continued Protonix 40 mg daily  Hyperlipidemia -Continued atorvastatin 20 mg daily  Comments:  Admitted to [facility] 400 hall  Signed: Waylan Boga, NP 07/09/2018, 2:03 PM  Patient seen face-to-face for psychiatric evaluation, chart reviewed and case discussed with the physician extender and developed treatment plan. Reviewed the information documented and agree with the treatment plan. Corena Pilgrim, MD

## 2018-07-09 NOTE — Tx Team (Signed)
Initial Treatment Plan 07/09/2018 5:53 PM Tamara [name] [id number]    PATIENT STRESSORS: Medication change or noncompliance   PATIENT STRENGTHS: Average or above average intelligence Capable of independent living Communication skills Motivation for treatment/growth   PATIENT IDENTIFIED PROBLEMS:       " my family doesn't have anything to do with me"    "couldn't afford medications "     "increased depression"        DISCHARGE CRITERIA:  Improved stabilization in mood, thinking, and/or behavior Reduction of life-threatening or endangering symptoms to within safe limits Verbal commitment to aftercare and medication compliance  PRELIMINARY DISCHARGE PLAN: Outpatient therapy Return to previous living arrangement  PATIENT/FAMILY INVOLVEMENT: This treatment plan has been presented to and reviewed with the patient, Tamara [name], The patient has been given the opportunity to ask questions and make suggestions.  Shela Nevin, RN 07/09/2018, 5:53 PM

## 2018-07-09 NOTE — Progress Notes (Signed)
D.  Pt pleasant on approach, complaint of anxiety.  Pt was positive for evening wrap up group, observed engaged in appropriate interaction with peers on the unit.  Pt denies SI/HI/AVH at this time, but did report passive SI on day shift.  A. Support and encouragement offered, medication given as ordered  R.  Pt remains safe on the unit, will continue to monitor.

## 2018-07-09 NOTE — [facility] Suicide Risk Assessment (Signed)
[facility] Admission Suicide Risk Assessment   Nursing information obtained from:   patient and chart  Demographic factors:   22, single, no children, lives with roommate  Current Mental Status:   see below Loss Factors:   financial stressors, relationship stressors Historical Factors:   history of prior admissions, history of Bipolar Disorder and Borderline P.D diagnosis Risk Reduction Factors:   resilience   Total Time spent with patient: 45 minutes Principal Problem: Schizoaffective disorder, depressive type (HCC) Diagnosis:  Active Problems:   Borderline personality disorder (HCC)  Subjective Data:   Continued Clinical Symptoms:    The "Alcohol Use Disorders Identification Test", Guidelines for Use in Primary Care, Second Edition.  World Science writer [facility]). Score between 0-7:  no or low risk or alcohol related problems. Score between 8-15:  moderate risk of alcohol related problems. Score between 16-19:  high risk of alcohol related problems. Score 20 or above:  warrants further diagnostic evaluation for alcohol dependence and treatment.   CLINICAL FACTORS:  23 year old female,single, no children, lives with roommate. Contacted EMS yesterday for " body ache". Reported suicidal ideations on evaluation, was brought to ED. Reports recent suicidal ideations with thoughts of overdosing, neuro-vegetative symptoms of depression, and auditory hallucinations of hypercritical, demeaning nature. History of prior Bipolar Disorder and Borderline P. D diagnosis. Reports she has been off her psychiatric medications for several weeks due to financial constraints. Stressors include financial difficulties, strained relationship with her roommate.   Psychiatric Specialty Exam: Physical Exam  ROS  Blood pressure 109/88, pulse (!) 101, temperature 97.8 F (36.6 C), temperature source Oral, resp. rate 18, height 5\' 2"  (1.575 m), weight 117.9 kg, SpO2 99 %.Body mass index is 47.55 kg/m.  See admit  note MSE   COGNITIVE FEATURES THAT CONTRIBUTE TO RISK:  Closed-mindedness and Loss of executive function    SUICIDE RISK:   Moderate:  Frequent suicidal ideation with limited intensity, and duration, some specificity in terms of plans, no associated intent, good self-control, limited dysphoria/symptomatology, some risk factors present, and identifiable protective factors, including available and accessible social support.  PLAN OF CARE: Patient will be admitted to inpatient psychiatric unit for stabilization and safety. Will provide and encourage milieu participation. Provide medication management and maked adjustments as needed.  Will follow daily.    I certify that inpatient services furnished can reasonably be expected to improve the patient's condition.   Craige Cotta, MD 07/09/2018, 6:42 PM

## 2018-07-10 MED ORDER — PANTOPRAZOLE SODIUM 40 MG PO TBEC
40.0000 mg | DELAYED_RELEASE_TABLET | Freq: Every day | ORAL | Status: DC
  Administered 2018-07-11 – 2018-07-14 (×3): 40 mg via ORAL
  Filled 2018-07-10 (×5): qty 1

## 2018-07-10 MED ORDER — HYDROXYZINE HCL 25 MG PO TABS
25.0000 mg | ORAL_TABLET | Freq: Every day | ORAL | Status: DC
  Administered 2018-07-10 – 2018-07-13 (×5): 25 mg via ORAL
  Filled 2018-07-10 (×6): qty 1

## 2018-07-10 MED ORDER — FLUOXETINE HCL 20 MG PO CAPS
20.0000 mg | ORAL_CAPSULE | Freq: Every day | ORAL | Status: DC
  Administered 2018-07-11 – 2018-07-14 (×3): 20 mg via ORAL
  Filled 2018-07-10 (×5): qty 1

## 2018-07-10 MED ORDER — ATORVASTATIN CALCIUM 20 MG PO TABS
20.0000 mg | ORAL_TABLET | Freq: Every day | ORAL | Status: DC
  Administered 2018-07-10 – 2018-07-13 (×4): 20 mg via ORAL
  Filled 2018-07-10 (×5): qty 1

## 2018-07-10 MED ORDER — ARIPIPRAZOLE 5 MG PO TABS
5.0000 mg | ORAL_TABLET | Freq: Every day | ORAL | Status: DC
  Filled 2018-07-10 (×2): qty 1

## 2018-07-10 NOTE — [facility] Suicide Risk Assessment (Signed)
[facility] INPATIENT:  Family/Significant Other Suicide Prevention Education  Suicide Prevention Education:  Patient Refusal for Family/Significant Other Suicide Prevention Education: The patient Tamara [name] has refused to provide written consent for family/significant other to be provided Family/Significant Other Suicide Prevention Education during admission and/or prior to discharge.  Physician notified.  Carloyn Jaeger Grossman-Orr 07/10/2018, 4:41 PM

## 2018-07-10 NOTE — Progress Notes (Signed)
[place] NOVEL CORONAVIRUS (COVID-19) DAILY CHECK-OFF SYMPTOMS - answer yes or no to each - every day NO YES  Have you had a fever in the past 24 hours?  . Fever (Temp > 37.80C / 100F) X   Have you had any of these symptoms in the past 24 hours? . New Cough .  Sore Throat  .  Shortness of Breath .  Difficulty Breathing .  Unexplained Body Aches   X   Have you had any one of these symptoms in the past 24 hours not related to allergies?   . Runny Nose .  Nasal Congestion .  Sneezing   X   If you have had runny nose, nasal congestion, sneezing in the past 24 hours, has it worsened?  X   EXPOSURES - check yes or no X   Have you traveled outside the state in the past 14 days?  X   Have you been in contact with someone with a confirmed diagnosis of COVID-19 or PUI in the past 14 days without wearing appropriate PPE?  X   Have you been living in the same home as a person with confirmed diagnosis of COVID-19 or a PUI (household contact)?    X   Have you been diagnosed with COVID-19?    X              What to do next: Answered NO to all: Answered YES to anything:   Proceed with unit schedule Follow the [facility] Inpatient Flowsheet.   

## 2018-07-10 NOTE — Progress Notes (Addendum)
[facility] MD Progress Note  07/10/2018 8:54 AM Tamara [name]  MRN:  [id number]   Subjective:  Reports her depression is 4/10 but still has suicidal ideations.  Does not reveal a specific plan but has attempted suicide in the past.  Reports being "tired".  23 yo who presented to [facility] with suicidal ideations with a multitude of plans but did not want to reveal them.  She also wanted to kill her roommate's dog because it barks all the time.  Stopped her medications two weeks ago due to finances.  History of personality disorder, PTSD, Autism, ADHD, and bipolar disorder.  Objective:  Patient seen and assessed in her room this morning as the patient is resting quietly on her bed.  She reports poor sleep and feeling very tired.  Encouraged her to get out of bed and attend groups while trying to stay awake during the day to promote better night sleep.  Appetite is good.  Remains suicidal but does not reveal her plan but only reports a 4/10 depression, anxiety is also present.  Fair to poor eye contact.  Principal Problem: Bipolar affective disorder, depressed, severe (HCC) Diagnosis: Principal Problem:   Bipolar affective disorder, depressed, severe (HCC) Active Problems:   Borderline personality disorder (HCC)  Total Time spent with patient: 30 minutes  Past Psychiatric History: ADHD, autism, bipolar d/o, PTSD, personality d/o  Past Medical History:  Past Medical History:  Diagnosis Date  . ADHD   . Autism   . Bipolar disorder (HCC)   . Chronic back pain    "mostly lower; sometimes all over" (03/22/2017)  . Chronic stomach ulcer   . Depression   . GERD (gastroesophageal reflux disease)   . Headache    "a few/week" (03/22/2017)  . Migraine    "monthly recently" (03/22/2017)  . Personality disorder (HCC)   . PTSD (post-traumatic stress disorder)   . Seizures (HCC)    "very random; I lose memory of what happens; usually happens when I'm in bed; probably 2-3/year" (03/22/2017)   History  reviewed. No pertinent surgical history. Family History:  Family History  Problem Relation Age of Onset  . Diabetes Mother   . Hypertension Maternal Grandmother   . Hyperlipidemia Maternal Grandmother   . Diabetes Maternal Grandmother    Family Psychiatric  History: none Social History:  Social History   Substance and Sexual Activity  Alcohol Use No  . Alcohol/week: 0.0 standard drinks     Social History   Substance and Sexual Activity  Drug Use No    Social History   Socioeconomic History  . Marital status: Single    Spouse name: Not on file  . Number of children: Not on file  . Years of education: Not on file  . Highest education level: Not on file  Occupational History  . Not on file  Social Needs  . Financial resource strain: Not on file  . Food insecurity:    Worry: Not on file    Inability: Not on file  . Transportation needs:    Medical: Not on file    Non-medical: Not on file  Tobacco Use  . Smoking status: Never Smoker  . Smokeless tobacco: Never Used  Substance and Sexual Activity  . Alcohol use: No    Alcohol/week: 0.0 standard drinks  . Drug use: No  . Sexual activity: Yes  Lifestyle  . Physical activity:    Days per week: Not on file    Minutes per session: Not on file  .  Stress: Not on file  Relationships  . Social connections:    Talks on phone: Not on file    Gets together: Not on file    Attends religious service: Not on file    Active member of club or organization: Not on file    Attends meetings of clubs or organizations: Not on file    Relationship status: Not on file  Other Topics Concern  . Not on file  Social History Narrative  . Not on file   Additional Social History:      Sleep: Poor  Appetite:  Good  Current Medications: Current Facility-Administered Medications  Medication Dose Route Frequency Provider Last Rate Last Dose  . acetaminophen (TYLENOL) tablet 650 mg  650 mg Oral Q6H PRN Laveda Abbe, NP    650 mg at 07/10/18 0849  . alum & mag hydroxide-simeth (MAALOX/MYLANTA) 200-200-20 MG/5ML suspension 30 mL  30 mL Oral Q4H PRN Laveda Abbe, NP      . ARIPiprazole (ABILIFY) tablet 5 mg  5 mg Oral Daily Laveda Abbe, NP   5 mg at 07/10/18 0846  . FLUoxetine (PROZAC) capsule 10 mg  10 mg Oral Daily Mika Anastasi, Rockey Situ, MD   10 mg at 07/10/18 0846  . LORazepam (ATIVAN) tablet 0.5 mg  0.5 mg Oral Q6H PRN Yanilen Adamik A, MD      . magnesium hydroxide (MILK OF MAGNESIA) suspension 30 mL  30 mL Oral Daily PRN Laveda Abbe, NP      . pantoprazole (PROTONIX) EC tablet 20 mg  20 mg Oral Daily Jewelene Mairena, Rockey Situ, MD   20 mg at 07/10/18 0846  . traZODone (DESYREL) tablet 50 mg  50 mg Oral QHS PRN Cieanna Stormes, Rockey Situ, MD   50 mg at 07/09/18 2136    Lab Results:  Results for orders placed or performed during the hospital encounter of 07/08/18 (from the past 48 hour(s))  Rapid urine drug screen (hospital performed)     Status: Abnormal   Collection Time: 07/08/18  1:32 PM  Result Value Ref Range   Opiates NONE DETECTED NONE DETECTED   Cocaine NONE DETECTED NONE DETECTED   Benzodiazepines NONE DETECTED NONE DETECTED   Amphetamines POSITIVE (A) NONE DETECTED   Tetrahydrocannabinol NONE DETECTED NONE DETECTED   Barbiturates NONE DETECTED NONE DETECTED    Comment: (NOTE) DRUG SCREEN FOR MEDICAL PURPOSES ONLY.  IF CONFIRMATION IS NEEDED FOR ANY PURPOSE, NOTIFY LAB WITHIN 5 DAYS. LOWEST DETECTABLE LIMITS FOR URINE DRUG SCREEN Drug Class                     Cutoff (ng/mL) Amphetamine and metabolites    1000 Barbiturate and metabolites    200 Benzodiazepine                 200 Tricyclics and metabolites     300 Opiates and metabolites        300 Cocaine and metabolites        300 THC                            50 Performed at [facility], [address] [place], [place] [address]   Comprehensive metabolic panel     Status: Abnormal   Collection Time: 07/08/18  1:38  PM  Result Value Ref Range   Sodium 141 135 - 145 mmol/L   Potassium 3.7 3.5 - 5.1 mmol/L  Chloride 108 98 - 111 mmol/L   CO2 21 (L) 22 - 32 mmol/L   Glucose, Bld 111 (H) 70 - 99 mg/dL   BUN 8 6 - 20 mg/dL   Creatinine, Ser 1.61 0.44 - 1.00 mg/dL   Calcium 9.4 8.9 - 09.6 mg/dL   Total Protein 7.2 6.5 - 8.1 g/dL   Albumin 3.8 3.5 - 5.0 g/dL   AST 17 15 - 41 U/L   ALT 25 0 - 44 U/L   Alkaline Phosphatase 138 (H) 38 - 126 U/L   Total Bilirubin 0.5 0.3 - 1.2 mg/dL   GFR calc non Af Amer >60 >60 mL/min   GFR calc Af Amer >60 >60 mL/min   Anion gap 12 5 - 15    Comment: Performed at [facility], [address] [place], [place] [address]  Ethanol     Status: None   Collection Time: 07/08/18  1:38 PM  Result Value Ref Range   Alcohol, Ethyl (B) <10 <10 mg/dL    Comment: (NOTE) Lowest detectable limit for serum alcohol is 10 mg/dL. For medical purposes only. Performed at [facility], [address], [place], [place] [address]   Salicylate level     Status: None   Collection Time: 07/08/18  1:38 PM  Result Value Ref Range   Salicylate Lvl <7.0 2.8 - 30.0 mg/dL    Comment: Performed at [facility], [address], [place], [place] [address]  Acetaminophen level     Status: Abnormal   Collection Time: 07/08/18  1:38 PM  Result Value Ref Range   Acetaminophen (Tylenol), Serum <10 (L) 10 - 30 ug/mL    Comment: (NOTE) Therapeutic concentrations vary significantly. A range of 10-30 ug/mL  may be an effective concentration for many patients. However, some  are best treated at concentrations outside of this range. Acetaminophen concentrations >150 ug/mL at 4 hours after ingestion  and >50 ug/mL at 12 hours after ingestion are often associated with  toxic reactions. Performed at [facility], [address] [place], [place] [address]   cbc     Status: Abnormal   Collection Time: 07/08/18  1:38 PM  Result Value Ref Range   WBC 11.5 (H) 4.0 - 10.5 K/uL    RBC 4.91 3.87 - 5.11 MIL/uL   Hemoglobin 14.0 12.0 - 15.0 g/dL   HCT 30.8 65.7 - 84.6 %   MCV 88.8 80.0 - 100.0 fL   MCH 28.5 26.0 - 34.0 pg   MCHC 32.1 30.0 - 36.0 g/dL   RDW 96.2 95.2 - 84.1 %   Platelets 346 150 - 400 K/uL   nRBC 0.0 0.0 - 0.2 %    Comment: Performed at [facility], [address] [place], [place] [address]  I-Stat beta hCG blood, ED     Status: None   Collection Time: 07/08/18  2:07 PM  Result Value Ref Range   I-stat hCG, quantitative <5.0 <5 mIU/mL   Comment 3            Comment:   GEST. AGE      CONC.  (mIU/mL)   <=1 WEEK        5 - 50     2 WEEKS       50 - 500     3 WEEKS       100 - 10,000     4 WEEKS     1,000 - 30,000  FEMALE AND NON-PREGNANT FEMALE:     LESS THAN 5 mIU/mL     Blood Alcohol level:  Lab Results  Component Value Date   ETH <10 07/08/2018   ETH <10 03/14/2018    Metabolic Disorder Labs: Lab Results  Component Value Date   HGBA1C 5.2 01/27/2018   MPG 102.54 01/27/2018   Lab Results  Component Value Date   PROLACTIN 94.3 (H) 01/27/2018   Lab Results  Component Value Date   CHOL 137 01/27/2018   TRIG 147 01/27/2018   HDL 40 (L) 01/27/2018   CHOLHDL 3.4 01/27/2018   VLDL 29 01/27/2018   LDLCALC 68 01/27/2018    Physical Findings: AIMS: Facial and Oral Movements Muscles of Facial Expression: None, normal Lips and Perioral Area: None, normal Jaw: None, normal Tongue: None, normal,Extremity Movements Upper (arms, wrists, hands, fingers): None, normal Lower (legs, knees, ankles, toes): None, normal, Trunk Movements Neck, shoulders, hips: None, normal, Overall Severity Severity of abnormal movements (highest score from questions above): None, normal Incapacitation due to abnormal movements: None, normal Patient's awareness of abnormal movements (rate only patient's report): No Awareness, Dental Status Current problems with teeth and/or dentures?: No Does patient usually wear dentures?: No  CIWA:    COWS:      Musculoskeletal: Strength & Muscle Tone: within normal limits Gait & Station: normal Patient leans: N/A  Psychiatric Specialty Exam: Physical Exam  Nursing note and vitals reviewed. Constitutional: She is oriented to person, place, and time. She appears well-developed and well-nourished.  HENT:  Head: Normocephalic.  Neck: Normal range of motion.  Respiratory: Effort normal.  Musculoskeletal: Normal range of motion.  Neurological: She is alert and oriented to person, place, and time.  Psychiatric: Her speech is normal and behavior is normal. Judgment normal. Her mood appears anxious. Her affect is blunt. Cognition and memory are normal. She exhibits a depressed mood. She expresses suicidal ideation.    Review of Systems  Psychiatric/Behavioral: Positive for depression and suicidal ideas. The patient is nervous/anxious.   All other systems reviewed and are negative.   Blood pressure 116/75, pulse 81, temperature 98.4 F (36.9 C), resp. rate 18, height 5\' 2"  (1.575 m), weight 117.9 kg, SpO2 99 %.Body mass index is 47.55 kg/m.  General Appearance: Casual  Eye Contact:  Fair  Speech:  Normal Rate  Volume:  Decreased  Mood:  Anxious and Depressed  Affect:  Blunt  Thought Process:  Coherent and Descriptions of Associations: Intact  Orientation:  Full (Time, Place, and Person)  Thought Content:  Rumination  Suicidal Thoughts:  Yes.  without intent/plan  Homicidal Thoughts:  No  Memory:  Immediate;   Fair Recent;   Fair Remote;   Fair  Judgement:  Poor  Insight:  Fair  Psychomotor Activity:  Decreased  Concentration:  Concentration: Fair and Attention Span: Fair  Recall:  FiservFair  Fund of Knowledge:  Fair  Language:  Good  Akathisia:  No  Handed:  Right  AIMS (if indicated):     Assets:  Housing Leisure Time Physical Health Resilience Social Support  ADL's:  Intact  Cognition:  WNL  Sleep:  Number of Hours: 5.75   Treatment Plan Summary: Daily contact with patient  to assess and evaluate symptoms and progress in treatment, Medication management and Plan Follow-up recommendations:   Bipolar affective disorder, depressed, severe without psychosis: -discontinued Trintellix 20 mg daily yesterday -Continued Abilify 5 mg daily -discontinued Latuda 120 mg at bedtime yesterday -Continued Prozac 20 mg daily  Insomnia -Discontinued  Trazodone 100 mg at bedtime yesterday -Start hydroxyzine 25 mg at bedtime, repeat times one in an hour if needed  Nightmares: -Discontinued Prazosin 2 mg at bedtime yesterday  GERD:   -Continued Protonix 40 mg daily  Hyperlipidemia -Continued atorvastatin 20 mg daily -ordered lipid panel  -Individual and group therapy -Encourage group and milieu participation -Treatment team working on disposition planning options -Discharge plan ongoing  Nanine Means, NP 07/10/2018, 8:54 AM   Attest to NP Progress Note

## 2018-07-10 NOTE — Progress Notes (Signed)
D. Pt has been calm and cooperative- friendly during interactions, but somewhat isolative during the day, staying in her room. Per pt's self inventory, pt rates her depression, hopelessness and anxiety an 8/7/8, respectively. Pt has complained of general back pain throughout the day- relieved by rest and tylenol. Pt currently denies SI/HI and AVH and agrees to contact staff before acting on any harmful thoughts.  A. Labs and vitals monitored. Pt compliant with medications. Pt supported emotionally and encouraged to express concerns and ask questions.   R. Pt remains safe with 15 minute checks. Will continue POC.

## 2018-07-10 NOTE — [facility] Group Notes (Signed)
[facility] LCSW Group Therapy Note  07/10/2018  10:00-11:00AM  Type of Therapy and Topic:  Group Therapy:  Adding Supports Including Yourself  Participation Level:  Minimal   Description of Group:  Patients in this group were introduced to the concept that additional supports including self-support are an essential part of recovery.  Patients listed what supports they believe they need to add to their lives to achieve their goals at discharge, and they listed such things as therapist, family, doctor, support groups, and service dog.  CSW described the  continuum of mental health/substance abuse services available and the group discussed the differences among these including support group, therapy group, 12-step group, Doctor, hospital, Secondary school teacher, and such.  A song entitled "My Own Hero" was played and a group discussion ensued in which patients stated they could relate to the song and it inspired them to realize they have be willing to help themselves in order to succeed, because other people cannot achieve sobriety or stability for them.  A song was played called "I Am Enough" which led to a discussion about being willing to believe we are worth the effort of being a self-support.  Group members expressed appreciation for each other.  Therapeutic Goals: 1)  demonstrate the importance of being a key part of one's own support system 2)  discuss various available supports 3)  encourage patient to use music as part of their self-support and focus on goals 4)  elicit ideas from patients about supports that need to be added   Summary of Patient Progress:  The patient expressed a support that she would like to add is a support group for people with autism, and a therapist to help her work through her anxiety about going to groups.  She left the group and did not return.   Therapeutic Modalities:   Motivational Interviewing Activity  Lynnell Chad

## 2018-07-10 NOTE — [facility] Group Notes (Signed)
[facility] Group Notes:  (Nursing/MHT/Case Management/Adjunct)  Date:  07/10/2018  Time:  9:28 AM  Type of Therapy:  Psychoeducational Skills  Participation Level:  Did Not Attend  Summary of Progress/Problems:  Tamara [name] 07/10/2018, 9:28 AM
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
## 2018-07-11 LAB — LIPID PANEL
Cholesterol: 172 mg/dL (ref 0–200)
HDL: 38 mg/dL — ABNORMAL LOW (ref >40)
LDL Cholesterol: 101 mg/dL — ABNORMAL HIGH (ref 0–99)
Total CHOL/HDL Ratio: 4.5 RATIO
Triglycerides: 166 mg/dL — ABNORMAL HIGH (ref <150)
VLDL: 33 mg/dL (ref 0–40)

## 2018-07-11 LAB — HEMOGLOBIN A1C
Hgb A1c MFr Bld: 5.1 % (ref 4.8–5.6)
Mean Plasma Glucose: 99.67 mg/dL

## 2018-07-11 LAB — TSH: TSH: 6.948 u[IU]/mL — ABNORMAL HIGH (ref 0.350–4.500)

## 2018-07-11 MED ORDER — OLANZAPINE 5 MG PO TABS
5.0000 mg | ORAL_TABLET | Freq: Every day | ORAL | Status: DC
  Administered 2018-07-11: 5 mg via ORAL
  Filled 2018-07-11 (×4): qty 1

## 2018-07-11 NOTE — Progress Notes (Signed)
Recreation Therapy Notes  Date:  5.4.20 Time: 0930 Location: [address] Dayroom  Group Topic: Stress Management  Goal Area(s) Addresses:  Patient will identify positive stress management techniques. Patient will identify benefits of using stress management post d/c.  Intervention: Stress Management  Activity :  Meditation.  LRT introduced the stress management technique of meditation.  The meditation focused on being resilient in the face of adversity.  Patients were to listen as meditation played in order to engage in activity.  Education:  Stress Management, Discharge Planning.   Education Outcome: Acknowledges Education  Clinical Observations/Feedback: Pt did not attend group.     Vastie Douty, LRT/CTRS         Roseanne Juenger A 07/11/2018 10:52 AM 

## 2018-07-11 NOTE — Progress Notes (Signed)
D: Patient observed isolative to room, observed sitting on bed. Patient's affect preoccupied, mood anxious and behavior childlike. Patient endorses seeing "people partying right over there in the corner of the room. I hear people saying things, too. I can't really make it out, but sometimes the voices put me down."  Denies pain, physical complaints during assessment. COVID-19 screen negative, afebrile. Respiratory assessment WDL.  A: Medicated per orders, no prns given. Medication education provided. Level III obs in place for safety. Emotional support offered. Patient encouraged to complete Suicide Safety Plan before discharge. Encouraged to attend and participate in unit programming.    R: Patient verbalizes understanding of POC. Patient came to desk complaining she could not sleep. Review of orders showed vistaril order written for a repeat if needed. Med given and patient is resting at this time. Patient endorsing passive SI but verbally contracts for safety. Denies HI and remains safe on level III obs. Will continue to monitor throughout the night.

## 2018-07-11 NOTE — Progress Notes (Signed)
D: Pt alert and oriented. Pt rates depression 4/10, hopelessness 4/10, and anxiety 8/10.Pt goal: "no clue". Pt reports energy leave as low and concentration as being poor. Pt reports sleep last night as being poor. Pt did receive medications for sleep and did find them helpful. Pt reports experiencing pain in her back, isn't able to describe pain, and rated it a 3/10. Pt denies experiencing any SI/HI, or AVH at this time.   Pt had to woken twice and asked to come to the medication room. Pt would not come with MHT asked. Pt did comply when this writer went and woke the pt.   A: Scheduled medications administered to pt, per MD orders. Support and encouragement provided. Frequent verbal contact made. Routine safety checks conducted q15 minutes.   R: No adverse drug reactions noted. Pt verbally contracts for safety at this time. Pt complaint with medications and treatment plan. Pt interacts well with others on the unit. Pt remains safe at this time. Will continue to monitor.

## 2018-07-11 NOTE — [facility] Group Notes (Signed)
LCSW Group Therapy Note 07/11/2018 3:27 PM  Type of Therapy and Topic: Group Therapy: Overcoming Obstacles  Participation Level: Did Not Attend  Description of Group:  In this group patients will be encouraged to explore what they see as obstacles to their own wellness and recovery. They will be guided to discuss their thoughts, feelings, and behaviors related to these obstacles. The group will process together ways to cope with barriers, with attention given to specific choices patients can make. Each patient will be challenged to identify changes they are motivated to make in order to overcome their obstacles. This group will be process-oriented, with patients participating in exploration of their own experiences as well as giving and receiving support and challenge from other group members.  Therapeutic Goals: 1. Patient will identify personal and current obstacles as they relate to admission. 2. Patient will identify barriers that currently interfere with their wellness or overcoming obstacles.  3. Patient will identify feelings, thought process and behaviors related to these barriers. 4. Patient will identify two changes they are willing to make to overcome these obstacles:   Summary of Patient Progress  Invited, chose not to attend.    Therapeutic Modalities:  Cognitive Behavioral Therapy Solution Focused Therapy Motivational Interviewing Relapse Prevention Therapy   Alcario Drought Clinical Social Worker

## 2018-07-11 NOTE — Progress Notes (Addendum)
[facility] MD Progress Note  07/11/2018 4:01 PM Tamara [name]  MRN:  [id number]   Subjective: Patient reports today that she is feeling okay.  She denies any suicidal or homicidal ideations.  She continues to report that she has auditory hallucinations telling her that she made a bad decision to come to the hospital.  She also reports that she feels anxious and reports her anxiety at a 6 out of 10.  She states that she always gets anxious when someone is taking blood from her and she cannot help to think that they are injecting her with drugs even though she knows they are only drawing blood.  She reports her depression to be at a 4 out of 10.  She states that her hallucinations have not improved since she has been here.  She also reports that her sleep was extremely poor last night after her medications was changed.  She does state that her appetite has been "okay".  She denies any medication side effects.  She does report a history of being on multiple medications.  She reports that she was on risperidone in the past and it did help her some and she had been on Abilify in the past as well and reported having an increased hallucinations in her teens when she was started on Abilify.  Objective: Patient's chart and findings reviewed and discussed with treatment team.  Patient presents in her room lying in the bed but is awake.  Patient is pleasant, calm, and cooperative.  Patient is talkative.  Patient has not been attending groups.  Principal Problem: Bipolar affective disorder, depressed, severe (HCC) Diagnosis: Principal Problem:   Bipolar affective disorder, depressed, severe (HCC) Active Problems:   Borderline personality disorder (HCC)  Total Time spent with patient: 30 minutes  Past Psychiatric History: See H&P  Past Medical History:  Past Medical History:  Diagnosis Date  . ADHD   . Autism   . Bipolar disorder (HCC)   . Chronic back pain    "mostly lower; sometimes all over"  (03/22/2017)  . Chronic stomach ulcer   . Depression   . GERD (gastroesophageal reflux disease)   . Headache    "a few/week" (03/22/2017)  . Migraine    "monthly recently" (03/22/2017)  . Personality disorder (HCC)   . PTSD (post-traumatic stress disorder)   . Seizures (HCC)    "very random; I lose memory of what happens; usually happens when I'm in bed; probably 2-3/year" (03/22/2017)   History reviewed. No pertinent surgical history. Family History:  Family History  Problem Relation Age of Onset  . Diabetes Mother   . Hypertension Maternal Grandmother   . Hyperlipidemia Maternal Grandmother   . Diabetes Maternal Grandmother    Family Psychiatric  History: See H&P Social History:  Social History   Substance and Sexual Activity  Alcohol Use No  . Alcohol/week: 0.0 standard drinks     Social History   Substance and Sexual Activity  Drug Use No    Social History   Socioeconomic History  . Marital status: Single    Spouse name: Not on file  . Number of children: Not on file  . Years of education: Not on file  . Highest education level: Not on file  Occupational History  . Not on file  Social Needs  . Financial resource strain: Not on file  . Food insecurity:    Worry: Not on file    Inability: Not on file  . Transportation needs:  Medical: Not on file    Non-medical: Not on file  Tobacco Use  . Smoking status: Never Smoker  . Smokeless tobacco: Never Used  Substance and Sexual Activity  . Alcohol use: No    Alcohol/week: 0.0 standard drinks  . Drug use: No  . Sexual activity: Yes  Lifestyle  . Physical activity:    Days per week: Not on file    Minutes per session: Not on file  . Stress: Not on file  Relationships  . Social connections:    Talks on phone: Not on file    Gets together: Not on file    Attends religious service: Not on file    Active member of club or organization: Not on file    Attends meetings of clubs or organizations: Not on file     Relationship status: Not on file  Other Topics Concern  . Not on file  Social History Narrative  . Not on file   Additional Social History:                         Sleep: Poor  Appetite:  Fair  Current Medications: Current Facility-Administered Medications  Medication Dose Route Frequency Provider Last Rate Last Dose  . acetaminophen (TYLENOL) tablet 650 mg  650 mg Oral Q6H PRN Laveda AbbeParks, Laurie Britton, NP   650 mg at 07/10/18 1807  . alum & mag hydroxide-simeth (MAALOX/MYLANTA) 200-200-20 MG/5ML suspension 30 mL  30 mL Oral Q4H PRN Laveda AbbeParks, Laurie Britton, NP   30 mL at 07/10/18 1235  . atorvastatin (LIPITOR) tablet 20 mg  20 mg Oral q1800 Charm RingsLord, Jamison Y, NP   20 mg at 07/10/18 1806  . FLUoxetine (PROZAC) capsule 20 mg  20 mg Oral Daily Charm RingsLord, Jamison Y, NP   20 mg at 07/11/18 0827  . hydrOXYzine (ATARAX/VISTARIL) tablet 25 mg  25 mg Oral QHS Charm RingsLord, Jamison Y, NP   25 mg at 07/10/18 2329  . magnesium hydroxide (MILK OF MAGNESIA) suspension 30 mL  30 mL Oral Daily PRN Laveda AbbeParks, Laurie Britton, NP      . OLANZapine [facility](ZYPREXA) tablet 5 mg  5 mg Oral QHS Money, Gerlene Burdockravis B, FNP      . pantoprazole (PROTONIX) EC tablet 40 mg  40 mg Oral Daily Charm RingsLord, Jamison Y, NP   40 mg at 07/11/18 16100827    Lab Results:  Results for orders placed or performed during the hospital encounter of 07/08/18 (from the past 48 hour(s))  TSH     Status: Abnormal   Collection Time: 07/11/18  6:42 AM  Result Value Ref Range   TSH 6.948 (H) 0.350 - 4.500 uIU/mL    Comment: Performed by a 3rd Generation assay with a functional sensitivity of <=0.01 uIU/mL. Performed at [facility], [address] [place], [place] [id number]   Lipid panel     Status: Abnormal   Collection Time: 07/11/18  6:42 AM  Result Value Ref Range   Cholesterol 172 0 - 200 mg/dL   Triglycerides 409166 (H) <150 mg/dL   HDL 38 (L) >81>40 mg/dL   Total CHOL/HDL Ratio 4.5 RATIO   VLDL 33 0 - 40 mg/dL   LDL Cholesterol 191101 (H) 0 -  99 mg/dL    Comment:        Total Cholesterol/HDL:CHD Risk Coronary Heart Disease Risk Table  Men   Women  1/2 Average Risk   3.4   3.3  Average Risk       5.0   4.4  2 X Average Risk   9.6   7.1  3 X Average Risk  23.4   11.0        Use the calculated Patient Ratio above and the CHD Risk Table to determine the patient's CHD Risk.        ATP III CLASSIFICATION (LDL):  <100     mg/dL   Optimal  308-657  mg/dL   Near or Above                    Optimal  130-159  mg/dL   Borderline  846-962  mg/dL   High  >952     mg/dL   Very High Performed at [facility], [address] [place], [place] [address]   Hemoglobin A1c     Status: None   Collection Time: 07/11/18  6:42 AM  Result Value Ref Range   Hgb A1c MFr Bld 5.1 4.8 - 5.6 %    Comment: (NOTE) Pre diabetes:          5.7%-6.4% Diabetes:              >6.4% Glycemic control for   <7.0% adults with diabetes    Mean Plasma Glucose 99.67 mg/dL    Comment: Performed at [facility], [address], [place], [place] [address]    Blood Alcohol level:  Lab Results  Component Value Date   [facility] <10 07/08/2018   ETH <10 03/14/2018    Metabolic Disorder Labs: Lab Results  Component Value Date   HGBA1C 5.1 07/11/2018   MPG 99.67 07/11/2018   MPG 102.54 01/27/2018   Lab Results  Component Value Date   PROLACTIN 94.3 (H) 01/27/2018   Lab Results  Component Value Date   CHOL 172 07/11/2018   TRIG 166 (H) 07/11/2018   HDL 38 (L) 07/11/2018   CHOLHDL 4.5 07/11/2018   VLDL 33 07/11/2018   LDLCALC 101 (H) 07/11/2018   LDLCALC 68 01/27/2018    Physical Findings: AIMS: Facial and Oral Movements Muscles of Facial Expression: None, normal Lips and Perioral Area: None, normal Jaw: None, normal Tongue: None, normal,Extremity Movements Upper (arms, wrists, hands, fingers): None, normal Lower (legs, knees, ankles, toes): None, normal, Trunk Movements Neck, shoulders, hips: None,  normal, Overall Severity Severity of abnormal movements (highest score from questions above): None, normal Incapacitation due to abnormal movements: None, normal Patient's awareness of abnormal movements (rate only patient's report): No Awareness, Dental Status Current problems with teeth and/or dentures?: No Does patient usually wear dentures?: No  CIWA:    COWS:     Musculoskeletal: Strength & Muscle Tone: within normal limits Gait & Station: normal Patient leans: N/A  Psychiatric Specialty Exam: Physical Exam  Nursing note and vitals reviewed. Constitutional: She is oriented to person, place, and time. She appears well-developed and well-nourished.  Cardiovascular: Normal rate.  Respiratory: Effort normal.  Musculoskeletal: Normal range of motion.  Neurological: She is alert and oriented to person, place, and time.  Skin: Skin is warm.    Review of Systems  Constitutional: Negative.   HENT: Negative.   Eyes: Negative.   Respiratory: Negative.   Cardiovascular: Negative.   Gastrointestinal: Negative.   Genitourinary: Negative.   Musculoskeletal: Negative.   Skin: Negative.   Neurological: Negative.   Endo/Heme/Allergies: Negative.  Psychiatric/Behavioral: Positive for depression and hallucinations. Negative for suicidal ideas. The patient is nervous/anxious.     Blood pressure 113/87, pulse 96, temperature 98 F (36.7 C), temperature source Oral, resp. rate 18, height  (1.575 m), weight 117.9 kg, SpO2 100 %.Body mass index is 47.55 kg/m.  General Appearance: Casual  Eye Contact:  Good  Speech:  Clear and Coherent and Normal Rate  Volume:  Normal  Mood:  Anxious and Depressed  Affect:  Flat  Thought Process:  Coherent and Descriptions of Associations: Intact  Orientation:  Full (Time, Place, and Person)  Thought Content:  Hallucinations: Auditory  Suicidal Thoughts:  No  Homicidal Thoughts:  No  Memory:  Immediate;   Good Recent;   Good Remote;   Good   Judgement:  Fair  Insight:  Fair  Psychomotor Activity:  Normal  Concentration:  Concentration: Good and Attention Span: Good  Recall:  Good  Fund of Knowledge:  Good  Language:  Good  Akathisia:  No  Handed:  Right  AIMS (if indicated):     Assets:  Communication Skills Desire for Improvement Financial Resources/Insurance Housing Physical Health Social Support Transportation  ADL's:  Intact  Cognition:  WNL  Sleep:  Number of Hours: 7.5   Problems addressed Bipolar affective disorder Borderline personality disorder  Treatment Plan Summary: Daily contact with patient to assess and evaluate symptoms and progress in treatment, Medication management and Plan is to: Discontinue Abilify Start Zyprexa 5 mg p.o. nightly for bipolar affective disorder and sleep Continue Prozac 20 mg PO Daily for depression  Reviewed labs and TSH is elevated have ordered T3 and T4 Encourage group therapy participation Social work to assist with disposition planning and outpatient resources  Maryfrances Bunnell, FNP 07/11/2018, 4:01 PM   Attest to NP Progress Note

## 2018-07-11 NOTE — Tx Team (Signed)
Interdisciplinary Treatment and Diagnostic Plan Update  07/11/2018 Time of Session:  Tamara [name] MRN: [id number]  Principal Diagnosis: Bipolar affective disorder, depressed, severe (HCC)  Secondary Diagnoses: Principal Problem:   Bipolar affective disorder, depressed, severe (HCC) Active Problems:   Borderline personality disorder (HCC)   Current Medications:  Current Facility-Administered Medications  Medication Dose Route Frequency Provider Last Rate Last Dose  . acetaminophen (TYLENOL) tablet 650 mg  650 mg Oral Q6H PRN Laveda Abbe, NP   650 mg at 07/10/18 1807  . alum & mag hydroxide-simeth (MAALOX/MYLANTA) 200-200-20 MG/5ML suspension 30 mL  30 mL Oral Q4H PRN Laveda Abbe, NP   30 mL at 07/10/18 1235  . ARIPiprazole (ABILIFY) tablet 5 mg  5 mg Oral QHS Charm Rings, NP      . atorvastatin (LIPITOR) tablet 20 mg  20 mg Oral q1800 Charm Rings, NP   20 mg at 07/10/18 1806  . FLUoxetine (PROZAC) capsule 20 mg  20 mg Oral Daily Charm Rings, NP   20 mg at 07/11/18 0827  . hydrOXYzine (ATARAX/VISTARIL) tablet 25 mg  25 mg Oral QHS Charm Rings, NP   25 mg at 07/10/18 2329  . magnesium hydroxide (MILK OF MAGNESIA) suspension 30 mL  30 mL Oral Daily PRN Laveda Abbe, NP      . pantoprazole (PROTONIX) EC tablet 40 mg  40 mg Oral Daily Charm Rings, NP   40 mg at 07/11/18 0827   PTA Medications: Medications Prior to Admission  Medication Sig Dispense Refill Last Dose  . amphetamine-dextroamphetamine (ADDERALL) 30 MG tablet Take 30 mg by mouth 2 (two) times daily.    07/08/2018 at am  . ARIPiprazole (ABILIFY) 5 MG tablet Take 1 tablet (5 mg total) by mouth daily. For mood control (Patient not taking: Reported on 07/08/2018) 30 tablet 0 Not Taking at Unknown time  . atorvastatin (LIPITOR) 20 MG tablet Take 20 mg by mouth at bedtime.    07/07/2018 at pm  . Etonogestrel (IMPLANON [place]) Inject 1 each into the skin once.   3 years ago  . FLUoxetine  (PROZAC) 20 MG capsule Take 1 capsule (20 mg total) by mouth daily. For depression (Patient not taking: Reported on 07/08/2018) 30 capsule 0 Not Taking at Unknown time  . ibuprofen (ADVIL) 200 MG tablet Take 400 mg by mouth every 6 (six) hours as needed for headache (pain).   few days ago  . Lurasidone HCl 120 MG TABS Take 120 mg by mouth at bedtime.    07/07/2018 at pm  . Multiple Vitamin (MULTIVITAMIN WITH MINERALS) TABS tablet Take 1 tablet by mouth daily.   07/08/2018 at am  . naproxen (NAPROSYN) 500 MG tablet Take 1 tablet (500 mg total) by mouth 2 (two) times daily. (Patient not taking: Reported on 07/08/2018) 30 tablet 0 Not Taking at Unknown time  . pantoprazole (PROTONIX) 40 MG tablet Take 1 tablet (40 mg total) by mouth daily. For acid reflux 15 tablet 0 07/08/2018 at am  . prazosin (MINIPRESS) 2 MG capsule Take 2 mg by mouth at bedtime.   07/07/2018 at pm  . Prenatal Vit-Fe Fumarate-FA (PRENATAL MULTIVITAMIN) TABS tablet Take 1 tablet by mouth daily at 12 noon. Vitamin supplement (Patient not taking: Reported on 03/14/2018)   Not Taking at Unknown time  . traZODone (DESYREL) 100 MG tablet Take 100 mg by mouth at bedtime.   07/07/2018 at pm  . vortioxetine HBr (TRINTELLIX) 20 MG TABS tablet Take  20 mg by mouth daily.    07/08/2018 at am    Patient Stressors: Medication change or noncompliance  Patient Strengths: Average or above average intelligence Capable of independent living Communication skills Motivation for treatment/growth  Treatment Modalities: Medication Management, Group therapy, Case management,  1 to 1 session with clinician, Psychoeducation, Recreational therapy.   Physician Treatment Plan for Primary Diagnosis: Bipolar affective disorder, depressed, severe (HCC) Long Term Goal(s): Improvement in symptoms so as ready for discharge Improvement in symptoms so as ready for discharge   Short Term Goals: Ability to identify changes in lifestyle to reduce recurrence of condition will  improve Ability to maintain clinical measurements within normal limits will improve Ability to identify changes in lifestyle to reduce recurrence of condition will improve Ability to verbalize feelings will improve Ability to disclose and discuss suicidal ideas Ability to demonstrate self-control will improve Ability to identify and develop effective coping behaviors will improve Ability to maintain clinical measurements within normal limits will improve  Medication Management: Evaluate patient's response, side effects, and tolerance of medication regimen.  Therapeutic Interventions: 1 to 1 sessions, Unit Group sessions and Medication administration.  Evaluation of Outcomes: Progressing  Physician Treatment Plan for Secondary Diagnosis: Principal Problem:   Bipolar affective disorder, depressed, severe (HCC) Active Problems:   Borderline personality disorder (HCC)  Long Term Goal(s): Improvement in symptoms so as ready for discharge Improvement in symptoms so as ready for discharge   Short Term Goals: Ability to identify changes in lifestyle to reduce recurrence of condition will improve Ability to maintain clinical measurements within normal limits will improve Ability to identify changes in lifestyle to reduce recurrence of condition will improve Ability to verbalize feelings will improve Ability to disclose and discuss suicidal ideas Ability to demonstrate self-control will improve Ability to identify and develop effective coping behaviors will improve Ability to maintain clinical measurements within normal limits will improve     Medication Management: Evaluate patient's response, side effects, and tolerance of medication regimen.  Therapeutic Interventions: 1 to 1 sessions, Unit Group sessions and Medication administration.  Evaluation of Outcomes: Progressing   RN Treatment Plan for Primary Diagnosis: Bipolar affective disorder, depressed, severe (HCC) Long Term Goal(s):  Knowledge of disease and therapeutic regimen to maintain health will improve  Short Term Goals: Ability to demonstrate self-control, Ability to participate in decision making will improve, Ability to verbalize feelings will improve, Ability to identify and develop effective coping behaviors will improve and Compliance with prescribed medications will improve  Medication Management: RN will administer medications as ordered by provider, will assess and evaluate patient's response and provide education to patient for prescribed medication. RN will report any adverse and/or side effects to prescribing provider.  Therapeutic Interventions: 1 on 1 counseling sessions, Psychoeducation, Medication administration, Evaluate responses to treatment, Monitor vital signs and CBGs as ordered, Perform/monitor CIWA, COWS, AIMS and Fall Risk screenings as ordered, Perform wound care treatments as ordered.  Evaluation of Outcomes: Progressing   LCSW Treatment Plan for Primary Diagnosis: Bipolar affective disorder, depressed, severe (HCC) Long Term Goal(s): Safe transition to appropriate next level of care at discharge, Engage patient in therapeutic group addressing interpersonal concerns.  Short Term Goals: Engage patient in aftercare planning with referrals and resources  Therapeutic Interventions: Assess for all discharge needs, 1 to 1 time with Social worker, Explore available resources and support systems, Assess for adequacy in community support network, Educate family and significant other(s) on suicide prevention, Complete Psychosocial Assessment, Interpersonal group therapy.  Evaluation of  Outcomes: Progressing   Progress in Treatment: Attending groups: Yes. Participating in groups: Yes. Taking medication as prescribed: Yes. Toleration medication: Yes. Family/Significant other contact made: No, will contact:  patient declined consent for collateral contacts Patient understands diagnosis: Yes.  Limited insight  Discussing patient identified problems/goals with staff: Yes. Medical problems stabilized or resolved: Yes. Denies suicidal/homicidal ideation: Yes. Issues/concerns per patient self-inventory: No. Other:   New problem(s) identified: None   New Short Term/Long Term Goal(s):medication stabilization, elimination of SI thoughts, development of comprehensive mental wellness plan.    Patient Goals:     Discharge Plan or Barriers: CSW will continue to follow and assess for appropriate referrals and discharge planning.   Reason for Continuation of Hospitalization: Anxiety Depression Hallucinations Medication stabilization Suicidal ideation  Estimated Length of Stay: 3-5 days   Attendees: Patient: 07/11/2018 10:19 AM  Physician: Dr. Nehemiah Massed, MD 07/11/2018 10:19 AM  Nursing: Ivonne Andrew.Salena Saner, RN 07/11/2018 10:19 AM  RN Care Manager: 07/11/2018 10:19 AM  Social Worker: Baldo Daub, LCSWA 07/11/2018 10:19 AM  Recreational Therapist:  07/11/2018 10:19 AM  Other:  07/11/2018 10:19 AM  Other:  07/11/2018 10:19 AM  Other: 07/11/2018 10:19 AM    Scribe for Treatment Team: Maeola Sarah, LCSWA 07/11/2018 10:19 AM

## 2018-07-11 NOTE — Progress Notes (Signed)
[facility] NOVEL CORONAVIRUS (COVID-19) DAILY CHECK-OFF SYMPTOMS - answer yes or no to each - every day NO YES  Have you had a fever in the past 24 hours?  . Fever (Temp > 37.80C / 100F) X   Have you had any of these symptoms in the past 24 hours? . New Cough .  Sore Throat  .  Shortness of Breath .  Difficulty Breathing .  Unexplained Body Aches   X   Have you had any one of these symptoms in the past 24 hours not related to allergies?   . Runny Nose .  Nasal Congestion .  Sneezing   X   If you have had runny nose, nasal congestion, sneezing in the past 24 hours, has it worsened?  X   EXPOSURES - check yes or no X   Have you traveled outside the state in the past 14 days?  X   Have you been in contact with someone with a confirmed diagnosis of COVID-19 or PUI in the past 14 days without wearing appropriate PPE?  X   Have you been living in the same home as a person with confirmed diagnosis of COVID-19 or a PUI (household contact)?    X   Have you been diagnosed with COVID-19?    X              What to do next: Answered NO to all: Answered YES to anything:   Proceed with unit schedule Follow the [facility] Inpatient Flowsheet.   

## 2018-07-12 DIAGNOSIS — F314 Bipolar disorder, current episode depressed, severe, without psychotic features: Principal | ICD-10-CM

## 2018-07-12 MED ORDER — TRAZODONE HCL 100 MG PO TABS
100.0000 mg | ORAL_TABLET | Freq: Every evening | ORAL | Status: DC | PRN
  Administered 2018-07-12 – 2018-07-13 (×2): 100 mg via ORAL
  Filled 2018-07-12 (×2): qty 1

## 2018-07-12 MED ORDER — QUETIAPINE FUMARATE 100 MG PO TABS
100.0000 mg | ORAL_TABLET | Freq: Every day | ORAL | Status: DC
  Administered 2018-07-12 – 2018-07-13 (×2): 100 mg via ORAL
  Filled 2018-07-12 (×3): qty 1

## 2018-07-12 NOTE — Progress Notes (Signed)
Patient ID: Tamara [name], female   DOB: 1995-04-25, 23 y.o.   MRN: [id number]   D: Patient pleasant but has had some somatic complaints tonight. Had to wake her up for medications and she had trouble getting back to sleep so an order for trazodone obtained, which she takes at home. She has had no active SI on [place]. She complains of having pain at her sternum that she has had on and off since she was 16. Blood pressure 95/64 with pulse 86. No hx of heart issues.  A: Staff will monitor on q 15 minute checks, follow treatment plan, and give medications as ordered. R: Cooperative on the unit.

## 2018-07-12 NOTE — Progress Notes (Signed)
Patient ID: Tamara [name], female   DOB: 15-Dec-1995, 23 y.o.   MRN: [id number]   D: Patient very attention seeking and childlike when speaking to her. She talked for about 20 minutes on various subjects. Still endorses some depression and visual hallucination " I seen a skeleton over there in a nurse's uniform". Patient wanting to get a new psychiatrist with outpatient that will get her on the right medications so she won't keep coming in and out of the hospital. Started on seroquel tonight, which she reports that she has been on before. Still has somatic thoughts during our conversation.  A: Staff will monitor on q 15 minute checks, follow treatment plan, and give medications as ordered. R: Cooperative on the unit.

## 2018-07-12 NOTE — Progress Notes (Signed)
[facility] MD Progress Note  07/12/2018 11:39 AM Tamara [name]  MRN:  [id number] Subjective:  "I'm tired."  Tamara [name] found asleep in bed. She reports difficulty sleeping last night. Per chart notes patient had to be awoken for HS meds and then was unable to fall back asleep. She had PRN trazodone but reports difficulty sleeping through remainder of night. She reports continuing AH that are unchanged since admission, telling her that she should not have told EMS she was feeling suicidal. She reports partial improvement in mood since admission. She states conflict with roommate has been main trigger for depression recently. Denies HI. She reports thoughts of "what if I died" but denies suicidal plan or intent. Patient refused labs this morning, says she is afraid of needles and does not want to have labs drawn. Denies medication side effects. She reports taking Seroquel in the past and said it was helpful for hallucinations and sleep.   Principal Problem: Bipolar affective disorder, depressed, severe (HCC) Diagnosis: Principal Problem:   Bipolar affective disorder, depressed, severe (HCC) Active Problems:   Borderline personality disorder (HCC)  Total Time spent with patient: 15 minutes  Past Psychiatric History: See admission H&P  Past Medical History:  Past Medical History:  Diagnosis Date  . ADHD   . Autism   . Bipolar disorder (HCC)   . Chronic back pain    "mostly lower; sometimes all over" (03/22/2017)  . Chronic stomach ulcer   . Depression   . GERD (gastroesophageal reflux disease)   . Headache    "a few/week" (03/22/2017)  . Migraine    "monthly recently" (03/22/2017)  . Personality disorder (HCC)   . PTSD (post-traumatic stress disorder)   . Seizures (HCC)    "very random; I lose memory of what happens; usually happens when I'm in bed; probably 2-3/year" (03/22/2017)   History reviewed. No pertinent surgical history. Family History:  Family History  Problem Relation  Age of Onset  . Diabetes Mother   . Hypertension Maternal Grandmother   . Hyperlipidemia Maternal Grandmother   . Diabetes Maternal Grandmother    Family Psychiatric  History: See admission H&P Social History:  Social History   Substance and Sexual Activity  Alcohol Use No  . Alcohol/week: 0.0 standard drinks     Social History   Substance and Sexual Activity  Drug Use No    Social History   Socioeconomic History  . Marital status: Single    Spouse name: Not on file  . Number of children: Not on file  . Years of education: Not on file  . Highest education level: Not on file  Occupational History  . Not on file  Social Needs  . Financial resource strain: Not on file  . Food insecurity:    Worry: Not on file    Inability: Not on file  . Transportation needs:    Medical: Not on file    Non-medical: Not on file  Tobacco Use  . Smoking status: Never Smoker  . Smokeless tobacco: Never Used  Substance and Sexual Activity  . Alcohol use: No    Alcohol/week: 0.0 standard drinks  . Drug use: No  . Sexual activity: Yes  Lifestyle  . Physical activity:    Days per week: Not on file    Minutes per session: Not on file  . Stress: Not on file  Relationships  . Social connections:    Talks on phone: Not on file    Gets together: Not on file  Attends religious service: Not on file    Active member of club or organization: Not on file    Attends meetings of clubs or organizations: Not on file    Relationship status: Not on file  Other Topics Concern  . Not on file  Social History Narrative  . Not on file   Additional Social History:                         Sleep: Poor  Appetite:  Good  Current Medications: Current Facility-Administered Medications  Medication Dose Route Frequency Provider Last Rate Last Dose  . acetaminophen (TYLENOL) tablet 650 mg  650 mg Oral Q6H PRN Laveda AbbeParks, Laurie Britton, NP   650 mg at 07/12/18 0033  . alum & mag  hydroxide-simeth (MAALOX/MYLANTA) 200-200-20 MG/5ML suspension 30 mL  30 mL Oral Q4H PRN Laveda AbbeParks, Laurie Britton, NP   30 mL at 07/10/18 1235  . atorvastatin (LIPITOR) tablet 20 mg  20 mg Oral q1800 Charm RingsLord, Jamison Y, NP   20 mg at 07/11/18 1734  . FLUoxetine (PROZAC) capsule 20 mg  20 mg Oral Daily Charm RingsLord, Jamison Y, NP   20 mg at 07/11/18 0827  . hydrOXYzine (ATARAX/VISTARIL) tablet 25 mg  25 mg Oral QHS Charm RingsLord, Jamison Y, NP   25 mg at 07/11/18 2132  . magnesium hydroxide (MILK OF MAGNESIA) suspension 30 mL  30 mL Oral Daily PRN Laveda AbbeParks, Laurie Britton, NP      . pantoprazole (PROTONIX) EC tablet 40 mg  40 mg Oral Daily Charm RingsLord, Jamison Y, NP   40 mg at 07/11/18 0827  . QUEtiapine (SEROQUEL) tablet 100 mg  100 mg Oral QHS Aldean BakerSykes, Daily Doe E, NP      . traZODone (DESYREL) tablet 100 mg  100 mg Oral QHS PRN Jackelyn PolingBerry, Jason A, NP   100 mg at 07/12/18 [id number]    Lab Results:  Results for orders placed or performed during the hospital encounter of 07/08/18 (from the past 48 hour(s))  TSH     Status: Abnormal   Collection Time: 07/11/18  6:42 AM  Result Value Ref Range   TSH 6.948 (H) 0.350 - 4.500 uIU/mL    Comment: Performed by a 3rd Generation assay with a functional sensitivity of <=0.01 uIU/mL. Performed at [facility], [address] [place], [place] [address]   Lipid panel     Status: Abnormal   Collection Time: 07/11/18  6:42 AM  Result Value Ref Range   Cholesterol 172 0 - 200 mg/dL   Triglycerides 782166 (H) <150 mg/dL   HDL 38 (L) >95>40 mg/dL   Total CHOL/HDL Ratio 4.5 RATIO   VLDL 33 0 - 40 mg/dL   LDL Cholesterol 621101 (H) 0 - 99 mg/dL    Comment:        Total Cholesterol/HDL:CHD Risk Coronary Heart Disease Risk Table                     Men   Women  1/2 Average Risk   3.4   3.3  Average Risk       5.0   4.4  2 X Average Risk   9.6   7.1  3 X Average Risk  23.4   11.0        Use the calculated Patient Ratio above and the CHD Risk Table to determine the patient's CHD Risk.         ATP III CLASSIFICATION (LDL):  <100  mg/dL   Optimal  409-811  mg/dL   Near or Above                    Optimal  130-159  mg/dL   Borderline  914-782  mg/dL   High  >956     mg/dL   Very High Performed at [facility], [address] [place], [place] [address]   Hemoglobin A1c     Status: None   Collection Time: 07/11/18  6:42 AM  Result Value Ref Range   Hgb A1c MFr Bld 5.1 4.8 - 5.6 %    Comment: (NOTE) Pre diabetes:          5.7%-6.4% Diabetes:              >6.4% Glycemic control for   <7.0% adults with diabetes    Mean Plasma Glucose 99.67 mg/dL    Comment: Performed at [facility], [address] [place], [place] [address]    Blood Alcohol level:  Lab Results  Component Value Date   [facility] <10 07/08/2018   ETH <10 03/14/2018    Metabolic Disorder Labs: Lab Results  Component Value Date   HGBA1C 5.1 07/11/2018   MPG 99.67 07/11/2018   MPG 102.54 01/27/2018   Lab Results  Component Value Date   PROLACTIN 94.3 (H) 01/27/2018   Lab Results  Component Value Date   CHOL 172 07/11/2018   TRIG 166 (H) 07/11/2018   HDL 38 (L) 07/11/2018   CHOLHDL 4.5 07/11/2018   VLDL 33 07/11/2018   LDLCALC 101 (H) 07/11/2018   LDLCALC 68 01/27/2018    Physical Findings: AIMS: Facial and Oral Movements Muscles of Facial Expression: None, normal Lips and Perioral Area: None, normal Jaw: None, normal Tongue: None, normal,Extremity Movements Upper (arms, wrists, hands, fingers): None, normal Lower (legs, knees, ankles, toes): None, normal, Trunk Movements Neck, shoulders, hips: None, normal, Overall Severity Severity of abnormal movements (highest score from questions above): None, normal Incapacitation due to abnormal movements: None, normal Patient's awareness of abnormal movements (rate only patient's report): No Awareness, Dental Status Current problems with teeth and/or dentures?: No Does patient usually wear dentures?: No  CIWA:     COWS:     Musculoskeletal: Strength & Muscle Tone: within normal limits Gait & Station: normal Patient leans: N/A  Psychiatric Specialty Exam: Physical Exam  Nursing note and vitals reviewed. Constitutional: She is oriented to person, place, and time. She appears well-developed and well-nourished.  Cardiovascular: Normal rate.  Respiratory: Effort normal.  Neurological: She is alert and oriented to person, place, and time.    Review of Systems  Constitutional: Negative.   Psychiatric/Behavioral: Positive for depression and hallucinations. Negative for memory loss, substance abuse and suicidal ideas. The patient has insomnia. The patient is not nervous/anxious.     Blood pressure 103/79, pulse 95, temperature 98 F (36.7 C), temperature source Oral, resp. rate 18, height  (1.575 m), weight 117.9 kg, SpO2 99 %.Body mass index is 47.55 kg/m.  General Appearance: Disheveled  Eye Contact:  Fair  Speech:  Normal Rate  Volume:  Increased  Mood:  Depressed  Affect:  Appropriate and Full Range  Thought Process:  Coherent  Orientation:  Full (Time, Place, and Person)  Thought Content:  Hallucinations: Auditory and Rumination  Suicidal Thoughts:  No  Homicidal Thoughts:  No  Memory:  Immediate;   Fair Recent;   Good  Judgement:  Intact  Insight:  Fair  Psychomotor Activity:  Normal  Concentration:  Concentration: Good  Recall:  Good  Fund of Knowledge:  Fair  Language:  Good  Akathisia:  No  Handed:  Right  AIMS (if indicated):     Assets:  Communication Skills Desire for Improvement Financial Resources/Insurance Housing Resilience  ADL's:  Intact  Cognition:  WNL  Sleep:  Number of Hours: 5.75     Treatment Plan Summary: Daily contact with patient to assess and evaluate symptoms and progress in treatment and Medication management   Continue inpatient hospitalization.  Discontinue Zyprexa Start Seroquel 100 mg PO QHS for hallucinations/mood/sleep Continue  Prozac 20 mg PO daily for mood Continue Protonix 40 mg PO daily for GERD Continue trazodone 100 mg PO QHS PRN insomnia Continue Vistaril 25 mg PO QHS for anxiety Continue Lipitor 20 mg PO daily for HLD  Patient will participate in the therapeutic group milieu.  Discharge disposition in progress.   Aldean Baker, NP 07/12/2018, 11:39 AM

## 2018-07-12 NOTE — Progress Notes (Signed)
Pt presents with a flat affect and labile mood. Pt noted to be irritable on approach. Pt refused to get up out of the bed this morning to take medications. Pt verbalized AVH but would not elaborate, stating that she have already told the doctor. Pt denies SI/HI. Pt reports ongoing depression and denies any anxiety. Pt reported difficulty sleeping last night.   Medications reviewed with pt. Verbal support provided. Pt encouraged to attend groups. 15 minute checks performed for safety.

## 2018-07-13 NOTE — Progress Notes (Signed)
[facility]  07/13/2018 12:40 PM Tamara [name]  MRN:  [id number] Subjective: Patient is a 23 year old female with a past psychiatric history significant for bipolar disorder [facility]/2018 with suicidal ideation.  Objective: Patient is seen and examined.  Patient is a 23 year old female with the above-stated past psychiatric history seen in follow-up.  She denied any complaint today.  She denied any auditory or visual hallucinations.  She stated her mood was stable.  She denied any suicidal ideation.  She stated that after discharge she would return to the home where she lives with a roommate.  She has discussed with social work and reiterates to me today that she would like a new psychiatric provider.  She stated that every time she is admitted to the hospital her medications are changed, and this is beneficial, then her outpatient provider changes the medications back to what they were.  She thinks this is leading to more complications.  Her vital signs are stable, she is afebrile.  She slept 6.5 hours last night.  Review of her laboratories revealed a mildly elevated glucose at 111, a elevated alkaline phosphatase at 138, elevated triglycerides at 166, and a drug screen positive for amphetamines.  Surprisingly she was asking me today about obtaining stimulants to help her focus and concentration.  Principal Problem: Bipolar affective disorder, depressed, severe (HCC) Diagnosis: Principal Problem:   Bipolar affective disorder, depressed, severe (HCC) Active Problems:   Borderline personality disorder (HCC)  Total Time spent with patient: 15 minutes  Past Psychiatric History: See admission H&P  Past Medical History:  Past Medical History:  Diagnosis Date  . ADHD   . Autism   . Bipolar disorder (HCC)   . Chronic back pain    "mostly lower; sometimes all over" (03/22/2017)  . Chronic stomach ulcer   .  Depression   . GERD (gastroesophageal reflux disease)   . Headache    "a few/week" (03/22/2017)  . Migraine    "monthly recently" (03/22/2017)  . Personality disorder (HCC)   . PTSD (post-traumatic stress disorder)   . Seizures (HCC)    "very random; I lose memory of what happens; usually happens when I'm in bed; probably 2-3/year" (03/22/2017)   History reviewed. No pertinent surgical history. Family History:  Family History  Problem Relation Age of Onset  . Diabetes Mother   . Hypertension Maternal Grandmother   . Hyperlipidemia Maternal Grandmother   . Diabetes Maternal Grandmother    Family Psychiatric  History: See admission H&P Social History:  Social History   Substance and Sexual Activity  Alcohol Use No  . Alcohol/week: 0.0 standard drinks     Social History   Substance and Sexual Activity  Drug Use No    Social History   Socioeconomic History  . Marital status: Single    Spouse name: Not on file  . Number of children: Not on file  . Years of education: Not on file  . Highest education level: Not on file  Occupational History  . Not on file  Social Needs  . Financial resource strain: Not on file  . Food insecurity:    Worry: Not on file    Inability: Not on file  . Transportation needs:    Medical: Not on file    Non-medical: Not on file  Tobacco Use  . Smoking status: Never Smoker  . Smokeless tobacco: Never Used  Substance and Sexual Activity  .  Alcohol use: No    Alcohol/week: 0.0 standard drinks  . Drug use: No  . Sexual activity: Yes  Lifestyle  . Physical activity:    Days per week: Not on file    Minutes per session: Not on file  . Stress: Not on file  Relationships  . Social connections:    Talks on phone: Not on file    Gets together: Not on file    Attends religious service: Not on file    Active member of club or organization: Not on file    Attends meetings of clubs or organizations: Not on file    Relationship status: Not on  file  Other Topics Concern  . Not on file  Social History Narrative  . Not on file   Additional Social History:                         Sleep: Good  Appetite:  Good  Current Medications: Current Facility-Administered Medications  Medication Dose Route Frequency Provider Last Rate Last Dose  . acetaminophen (TYLENOL) tablet 650 mg  650 mg Oral Q6H PRN Laveda Abbe, NP   650 mg at 07/13/18 1610  . alum & mag hydroxide-simeth (MAALOX/MYLANTA) 200-200-20 MG/5ML suspension 30 mL  30 mL Oral Q4H PRN Laveda Abbe, NP   30 mL at 07/10/18 1235  . atorvastatin (LIPITOR) tablet 20 mg  20 mg Oral q1800 Charm Rings, NP   20 mg at 07/12/18 1702  . FLUoxetine (PROZAC) capsule 20 mg  20 mg Oral Daily Charm Rings, NP   20 mg at 07/13/18 9604  . hydrOXYzine (ATARAX/VISTARIL) tablet 25 mg  25 mg Oral QHS Charm Rings, NP   25 mg at 07/12/18 2201  . magnesium hydroxide (MILK OF MAGNESIA) suspension 30 mL  30 mL Oral Daily PRN Laveda Abbe, NP      . pantoprazole (PROTONIX) EC tablet 40 mg  40 mg Oral Daily Charm Rings, NP   40 mg at 07/13/18 0806  . QUEtiapine (SEROQUEL) tablet 100 mg  100 mg Oral QHS Aldean Baker, NP   100 mg at 07/12/18 2201  . traZODone (DESYREL) tablet 100 mg  100 mg Oral QHS PRN Nira Conn A, NP   100 mg at 07/12/18 0033    Lab Results: No results found for this or any previous visit (from the past 48 hour(s)).  Blood Alcohol level:  Lab Results  Component Value Date   ETH <10 07/08/2018   ETH <10 03/14/2018    Metabolic Disorder Labs: Lab Results  Component Value Date   HGBA1C 5.1 07/11/2018   MPG 99.67 07/11/2018   MPG 102.54 01/27/2018   Lab Results  Component Value Date   PROLACTIN 94.3 (H) 01/27/2018   Lab Results  Component Value Date   CHOL 172 07/11/2018   TRIG 166 (H) 07/11/2018   HDL 38 (L) 07/11/2018   CHOLHDL 4.5 07/11/2018   VLDL 33 07/11/2018   LDLCALC 101 (H) 07/11/2018   LDLCALC 68  01/27/2018    Physical Findings: AIMS: Facial and Oral Movements Muscles of Facial Expression: None, normal Lips and Perioral Area: None, normal Jaw: None, normal Tongue: None, normal,Extremity Movements Upper (arms, wrists, hands, fingers): None, normal Lower (legs, knees, ankles, toes): None, normal, Trunk Movements Neck, shoulders, hips: None, normal, Overall Severity Severity of abnormal movements (highest score from questions above): None, normal Incapacitation due to abnormal movements: None, normal Patient's  awareness of abnormal movements (rate only patient's report): No Awareness, Dental Status Current problems with teeth and/or dentures?: No Does patient usually wear dentures?: No  CIWA:    COWS:     Musculoskeletal: Strength & Muscle Tone: within normal limits Gait & Station: normal Patient leans: N/A  Psychiatric Specialty Exam: Physical Exam  Nursing note and vitals reviewed. Constitutional: She is oriented to person, place, and time. She appears well-developed and well-nourished.  HENT:  Head: Normocephalic and atraumatic.  Respiratory: Effort normal.  Neurological: She is alert and oriented to person, place, and time.    ROS  Blood pressure 114/78, pulse (!) 106, temperature (!) 97.4 F (36.3 C), temperature source Oral, resp. rate 18, height 5\' 2"  (1.575 m), weight 117.9 kg, SpO2 99 %.Body mass index is 47.55 kg/m.  General Appearance: Casual  Eye Contact:  Fair  Speech:  Normal Rate  Volume:  Normal  Mood:  Dysphoric  Affect:  Congruent  Thought Process:  Coherent and Descriptions of Associations: Circumstantial  Orientation:  Full (Time, Place, and Person)  Thought Content:  Logical  Suicidal Thoughts:  No  Homicidal Thoughts:  No  Memory:  Immediate;   Fair Recent;   Fair Remote;   Fair  Judgement:  Intact  Insight:  Fair  Psychomotor Activity:  Psychomotor Retardation  Concentration:  Concentration: Fair and Attention Span: Fair  Recall:   [employer] of Knowledge:  Fair  Language:  Fair  Akathisia:  Negative  Handed:  Right  AIMS (if indicated):     Assets:  Desire for Improvement Resilience  ADL's:  Intact  Cognition:  WNL  Sleep:  Number of Hours: 6.5     Treatment Plan Summary: Daily contact with patient to assess and evaluate symptoms and progress in treatment, Medication management and Plan : Patient is seen and examined.  Patient is a 23 year old female with the above-stated past psychiatric history was seen in follow-up.   Diagnosis: #1 bipolar disorder, #2 hyperlipidemia, #3 GERD  Patient is seen in follow-up.  She is improving during the course the hospitalization.  No psychosis or suicidal or homicidal ideation currently.  We will continue her fluoxetine, Seroquel and trazodone at their current dosages.  No change in her Lipitor as well.  If things continue to go well we will consider discharge in the a.m. 1.  Continue Lipitor 20 mg p.o. daily for hyperlipidemia. 2.  Continue fluoxetine 20 mg p.o. daily for mood and anxiety. 3.  Continue hydroxyzine 25 mg p.o. nightly for insomnia. 4.  Continue Protonix 40 mg p.o. daily for GERD. 5.  Continue Seroquel 100 mg p.o. nightly for mood stability and insomnia. 6.  Continue trazodone 100 mg p.o. nightly as needed insomnia. 7.  Disposition planning-in progress.  Antonieta Pert, MD 07/13/2018, 12:40 PM

## 2018-07-13 NOTE — [facility] Group Notes (Signed)
Pt did not attend wrap up group this evening. Pt was asleep in bed.  

## 2018-07-13 NOTE — Progress Notes (Signed)
Patient was asking staff for her cell phone to get phone numbers. Patient as given a couple minutes to look through her phone and get some contacts. Patient's phone was then securely locked back in her locker.

## 2018-07-13 NOTE — Plan of Care (Addendum)
Patient was pleasant yet childlike upon approach today. Patient complains of visual hallucinations of a "nurse skeleton" coming in her room at night. Patient reports she does not have auditory hallucinations and smiles as she tells Clinical research associate about the visual hallucination. Patient is compliant with medications- no side effects noted. Denies SI. Endorses physical pain- tylenol given. Patient safety is maintained with 15 minute checks as well as environmental checks. Will continue to monitor and assess.  Problem: Education: Goal: Knowledge of [facility] Education information/materials will improve Outcome: Progressing Goal: Emotional status will improve Outcome: Progressing Goal: Mental status will improve Outcome: Progressing Goal: Verbalization of understanding the information provided will improve Outcome: Progressing

## 2018-07-13 NOTE — Progress Notes (Signed)
D: Pt denies SI/HI/AVH. Pt is pleasant and cooperative. Pt recognized Clinical research associate from prior admission. Pt stated she felt she was having earlier Sx of nausea before coming in due to stress and not being able to manage.   A: Pt was offered support and encouragement. Pt was given scheduled medications. Pt was encourage to attend groups. Q 15 minute checks were done for safety.   R: safety maintained on unit.  Problem: Education: Goal: Emotional status will improve Outcome: Progressing   Problem: Education: Goal: Mental status will improve Outcome: Progressing

## 2018-07-14 MED ORDER — FLUOXETINE HCL 20 MG PO CAPS: 20.0000 mg | ORAL_CAPSULE | Freq: Every day | ORAL | 0 refills | Status: DC

## 2018-07-14 MED ORDER — QUETIAPINE FUMARATE 200 MG PO TABS
200.0000 mg | ORAL_TABLET | Freq: Every day | ORAL | Status: DC
  Filled 2018-07-14: qty 1

## 2018-07-14 MED ORDER — QUETIAPINE FUMARATE 200 MG PO TABS: 200.0000 mg | ORAL_TABLET | Freq: Every day | ORAL | 0 refills | Status: DC

## 2018-07-14 MED ORDER — TRAZODONE HCL 100 MG PO TABS: 100.0000 mg | ORAL_TABLET | Freq: Every evening | ORAL | 0 refills | Status: DC | PRN

## 2018-07-14 MED ORDER — HYDROXYZINE HCL 25 MG PO TABS: 25.0000 mg | ORAL_TABLET | Freq: Every day | ORAL | 0 refills | Status: DC

## 2018-07-14 MED ORDER — PANTOPRAZOLE SODIUM 40 MG PO TBEC: 40.0000 mg | DELAYED_RELEASE_TABLET | Freq: Every day | ORAL | 0 refills | Status: DC

## 2018-07-14 MED ORDER — LORAZEPAM 1 MG PO TABS
1.0000 mg | ORAL_TABLET | ORAL | Status: AC
  Administered 2018-07-14: 1 mg via ORAL
  Filled 2018-07-14: qty 1

## 2018-07-14 NOTE — Plan of Care (Signed)
Discharge note  Patient verbalizes readiness for discharge. Follow up plan explained, AVS, Transition record and SRA given. Prescriptions and teaching provided. Belongings returned and signed for. Suicide safety plan completed and signed. Patient verbalizes understanding. Patient denies SI/HI and assures this writer she will seek assistance should that change. Patient discharged to lobby where pt was calling an uber.  Problem: Education: Goal: Knowledge of [facility] Education information/materials will improve Outcome: Adequate for Discharge Goal: Emotional status will improve Outcome: Adequate for Discharge Goal: Mental status will improve Outcome: Adequate for Discharge Goal: Verbalization of understanding the information provided will improve Outcome: Adequate for Discharge   Problem: Activity: Goal: Interest or engagement in activities will improve Outcome: Adequate for Discharge Goal: Sleeping patterns will improve Outcome: Adequate for Discharge   Problem: Coping: Goal: Ability to verbalize frustrations and anger appropriately will improve Outcome: Adequate for Discharge Goal: Ability to demonstrate self-control will improve Outcome: Adequate for Discharge   Problem: Health Behavior/Discharge Planning: Goal: Identification of resources available to assist in meeting health care needs will improve Outcome: Adequate for Discharge Goal: Compliance with treatment plan for underlying cause of condition will improve Outcome: Adequate for Discharge   Problem: Physical Regulation: Goal: Ability to maintain clinical measurements within normal limits will improve Outcome: Adequate for Discharge   Problem: Safety: Goal: Periods of time without injury will increase Outcome: Adequate for Discharge   Problem: Education: Goal: Ability to make informed decisions regarding treatment will improve Outcome: Adequate for Discharge   Problem: Coping: Goal: Coping ability will  improve Outcome: Adequate for Discharge   Problem: Health Behavior/Discharge Planning: Goal: Identification of resources available to assist in meeting health care needs will improve Outcome: Adequate for Discharge   Problem: Medication: Goal: Compliance with prescribed medication regimen will improve Outcome: Adequate for Discharge   Problem: Self-Concept: Goal: Ability to disclose and discuss suicidal ideas will improve Outcome: Adequate for Discharge Goal: Will verbalize positive feelings about self Outcome: Adequate for Discharge   Problem: Education: Goal: Utilization of techniques to improve thought processes will improve Outcome: Adequate for Discharge Goal: Knowledge of the prescribed therapeutic regimen will improve Outcome: Adequate for Discharge   Problem: Activity: Goal: Interest or engagement in leisure activities will improve Outcome: Adequate for Discharge Goal: Imbalance in normal sleep/wake cycle will improve Outcome: Adequate for Discharge   Problem: Coping: Goal: Coping ability will improve Outcome: Adequate for Discharge Goal: Will verbalize feelings Outcome: Adequate for Discharge   Problem: Health Behavior/Discharge Planning: Goal: Ability to make decisions will improve Outcome: Adequate for Discharge Goal: Compliance with therapeutic regimen will improve Outcome: Adequate for Discharge   Problem: Role Relationship: Goal: Will demonstrate positive changes in social behaviors and relationships Outcome: Adequate for Discharge   Problem: Safety: Goal: Ability to disclose and discuss suicidal ideas will improve Outcome: Adequate for Discharge Goal: Ability to identify and utilize support systems that promote safety will improve Outcome: Adequate for Discharge   Problem: Self-Concept: Goal: Will verbalize positive feelings about self Outcome: Adequate for Discharge Goal: Level of anxiety will decrease Outcome: Adequate for Discharge

## 2018-07-14 NOTE — [facility] Suicide Risk Assessment (Signed)
[facility] Discharge Suicide Risk Assessment   Principal Problem: Bipolar affective disorder, depressed, severe (HCC) Discharge Diagnoses: Principal Problem:   Bipolar affective disorder, depressed, severe (HCC) Active Problems:   Borderline personality disorder (HCC)   Total Time spent with patient: 15 minutes  Musculoskeletal: Strength & Muscle Tone: within normal limits Gait & Station: normal Patient leans: N/A  Psychiatric Specialty Exam: Review of Systems  All other systems reviewed and are negative.   Blood pressure 108/86, pulse (!) 107, temperature (!) 97.5 F (36.4 C), temperature source Oral, resp. rate 16, height 5\' 2"  (1.575 m), weight 117.9 kg, SpO2 99 %.Body mass index is 47.55 kg/m.  General Appearance: Casual  Eye Contact::  Fair  Speech:  Normal Rate409  Volume:  Normal  Mood:  Euthymic  Affect:  Congruent  Thought Process:  Coherent and Descriptions of Associations: Intact  Orientation:  Full (Time, Place, and Person)  Thought Content:  Logical  Suicidal Thoughts:  No  Homicidal Thoughts:  No  Memory:  Immediate;   Fair Recent;   Fair Remote;   Fair  Judgement:  Intact  Insight:  Fair  Psychomotor Activity:  Normal  Concentration:  Fair  Recall:  [employer] of Knowledge:Fair  Language: Fair  Akathisia:  Negative  Handed:  Right  AIMS (if indicated):     Assets:  Desire for Improvement Resilience  Sleep:  Number of Hours: 5.25  Cognition: WNL  ADL's:  Intact   Mental Status Per Nursing Assessment::   On Admission:     Demographic Factors:  Adolescent or young adult, Low socioeconomic status and Unemployed  Loss Factors: NA  Historical Factors: Impulsivity  Risk Reduction Factors:   Positive coping skills or problem solving skills  Continued Clinical Symptoms:  Bipolar Disorder:   Mixed State  Cognitive Features That Contribute To Risk:  None    Suicide Risk:  Minimal: No identifiable suicidal ideation.  Patients presenting with no  risk factors but with morbid ruminations; may be classified as minimal risk based on the severity of the depressive symptoms  Follow-up Information    [facility] Follow up on 07/22/2018.   Specialty:  Behavioral Health Why:  Therapy appointment with Rene Kocher is Friday, 5/15 at 3:00p.  Office will inform Misty Stanley that you would like a new psychiatrist. They will contact you with an medication management appointment with a new psychiatrist.  Contact information: [address] [place] [address] [phone]           Plan Of Care/Follow-up recommendations:  Activity:  ad lib  Antonieta Pert, MD 07/14/2018, 7:56 AM

## 2018-07-14 NOTE — Discharge Summary (Signed)
Physician Discharge Summary Note  Patient:  Tamara [name] is an 23 y.o., female MRN:  [id number] DOB:  Apr 26, 1995 Patient phone:  [phone] (home)  Patient address:   [address] [place] [id number],  Total Time spent with patient: 15 minutes  Date of Admission:  07/08/2018 Date of Discharge: 07/14/18  Reason for Admission:  Suicidal ideation  Principal Problem: Bipolar affective disorder, depressed, severe (HCC) Discharge Diagnoses: Principal Problem:   Bipolar affective disorder, depressed, severe (HCC) Active Problems:   Borderline personality disorder [facility](HCC)   Past Psychiatric History: Per admission H&P: History of prior psychiatric admissions, most recently 11/19. At the time was admitted for depression, suicidal ideations, auditory hallucinations. Was diagnosed with Bipolar Disorder and Borderline P.D features . At the time she was discharged on Abilify, Prozac . History of suicidal attempts in the past by overdosing and describes prior history of self cutting , reports she last cut several months ago.   Reports history of PTSD symptoms as above .   Past Medical History:  Past Medical History:  Diagnosis Date  . ADHD   . Autism   . Bipolar disorder (HCC)   . Chronic back pain    "mostly lower; sometimes all over" (03/22/2017)  . Chronic stomach ulcer   . Depression   . GERD (gastroesophageal reflux disease)   . Headache    "a few/week" (03/22/2017)  . Migraine    "monthly recently" (03/22/2017)  . Personality disorder (HCC)   . PTSD (post-traumatic stress disorder)   . Seizures (HCC)    "very random; I lose memory of what happens; usually happens when I'm in bed; probably 2-3/year" (03/22/2017)   History reviewed. No pertinent surgical history. Family History:  Family History  Problem Relation Age of Onset  . Diabetes Mother   . Hypertension Maternal Grandmother   . Hyperlipidemia Maternal Grandmother   . Diabetes Maternal Grandmother     Family Psychiatric  History: Per admission H&P: reports mother, grandmother have a history of Depression. States mother has attempted suicide in the past, father has history of alcohol abuse  Social History:  Social History   Substance and Sexual Activity  Alcohol Use No  . Alcohol/week: 0.0 standard drinks     Social History   Substance and Sexual Activity  Drug Use No    Social History   Socioeconomic History  . Marital status: Single    Spouse name: Not on file  . Number of children: Not on file  . Years of education: Not on file  . Highest education level: Not on file  Occupational History  . Not on file  Social Needs  . Financial resource strain: Not on file  . Food insecurity:    Worry: Not on file    Inability: Not on file  . Transportation needs:    Medical: Not on file    Non-medical: Not on file  Tobacco Use  . Smoking status: Never Smoker  . Smokeless tobacco: Never Used  Substance and Sexual Activity  . Alcohol use: No    Alcohol/week: 0.0 standard drinks  . Drug use: No  . Sexual activity: Yes  Lifestyle  . Physical activity:    Days per week: Not on file    Minutes per session: Not on file  . Stress: Not on file  Relationships  . Social connections:    Talks on phone: Not on file    Gets together: Not on file    Attends religious  service: Not on file    Active member of club or organization: Not on file    Attends meetings of clubs or organizations: Not on file    Relationship status: Not on file  Other Topics Concern  . Not on file  Social History Narrative  . Not on file    Hospital Course:  Per admission H&P: Patient presented to the ED on 5/1 via EMS. States she had called EMS because she was experiencing " body aches" . She endorsed suicidal ideations to EMS on evaluation and was brought to hospital. She reports chronic suicidal ideations which have been " going on for years ", but states she has had more frequent thoughts recently,  with recent thoughts of overdosing . She also states she has been " more stressed and more irritable lately", which she attributes to different stressors, including financial stressors, "someone trolling on my phone ", and strained relationship with her roommate. States " she parties all the time, and her dog darks all the time". States she has had thoughts of killing the dog, but denies any HI towards her or towards people. Endorses neuro-vegetative symptoms as below.  Ms. Crossley was admitted for depression with suicidal ideations. She also reported auditory hallucinations. Patient reported difficulties getting along with her roommate. Patient reported she had discontinued medications (Trintellix and Abilify) due to financial difficulties. She was started on Prozac, Abilify, and Vistaril. She continued to report hallucinations and paranoia. Abilify was stopped, and Zyprexa was started. She continued to report hallucinations, as well as difficulty sleeping. She requested change to Seroquel, which had been helpful in past. Seroquel was started and increased to 200 mg QHS. She responded well to treatment with no adverse effects reported. She participated in group therapy on the unit. She remained on the [facility] unit for 6 days. She stabilized with medication and therapy. She was discharged on the medications listed below. She has shown improvement with improved mood, affect, sleep, appetite, and interaction. She denies any SI/HI/AVH and contracts for safety. She agrees to follow up at [facility]- she requested new psychiatrist there due to feeling that previous provider had changed her medications too much after previous hospitalizations (see below). Patient is provided with prescriptions for medications upon discharge. She is discharging home via [place].   Physical Findings: AIMS: Facial and Oral Movements Muscles of Facial Expression: None, normal Lips and Perioral Area: None,  normal Jaw: None, normal Tongue: None, normal,Extremity Movements Upper (arms, wrists, hands, fingers): None, normal Lower (legs, knees, ankles, toes): None, normal, Trunk Movements Neck, shoulders, hips: None, normal, Overall Severity Severity of abnormal movements (highest score from questions above): None, normal Incapacitation due to abnormal movements: None, normal Patient's awareness of abnormal movements (rate only patient's report): No Awareness, Dental Status Current problems with teeth and/or dentures?: No Does patient usually wear dentures?: No  CIWA:    COWS:     Musculoskeletal: Strength & Muscle Tone: within normal limits Gait & Station: normal Patient leans: N/A  Psychiatric Specialty Exam: Physical Exam  Nursing note and vitals reviewed. Constitutional: She is oriented to person, place, and time. She appears well-developed and well-nourished.  Cardiovascular: Normal rate.  Respiratory: Effort normal.  Neurological: She is alert and oriented to person, place, and time.    Review of Systems  Constitutional: Negative.   Psychiatric/Behavioral: Positive for depression (improving). Negative for hallucinations, substance abuse and suicidal ideas. The patient is not nervous/anxious and does not have insomnia.  Blood pressure 108/86, pulse (!) 107, temperature (!) 97.5 F (36.4 C), temperature source Oral, resp. rate 16, height 5\' 2"  (1.575 m), weight 117.9 kg, SpO2 99 %.Body mass index is 47.55 kg/m.  See MD's discharge SRA     Have you used any form of tobacco in the last 30 days? (Cigarettes, Smokeless Tobacco, Cigars, and/or Pipes): No  Has this patient used any form of tobacco in the last 30 days? (Cigarettes, Smokeless Tobacco, Cigars, and/or Pipes)  No  Blood Alcohol level:  Lab Results  Component Value Date   ETH <10 07/08/2018   ETH <10 03/14/2018    Metabolic Disorder Labs:  Lab Results  Component Value Date   HGBA1C 5.1 07/11/2018   MPG 99.67  07/11/2018   MPG 102.54 01/27/2018   Lab Results  Component Value Date   PROLACTIN 94.3 (H) 01/27/2018   Lab Results  Component Value Date   CHOL 172 07/11/2018   TRIG 166 (H) 07/11/2018   HDL 38 (L) 07/11/2018   CHOLHDL 4.5 07/11/2018   VLDL 33 07/11/2018   LDLCALC 101 (H) 07/11/2018   LDLCALC 68 01/27/2018    See Psychiatric Specialty Exam and Suicide Risk Assessment completed by Attending Physician prior to discharge.  Discharge destination:  Home  Is patient on multiple antipsychotic therapies at discharge:  No   Has Patient had three or more failed trials of antipsychotic monotherapy by history:  No  Recommended Plan for Multiple Antipsychotic Therapies: NA  Discharge Instructions    Discharge instructions   Complete by:  As directed    Patient is instructed to take all prescribed medications as recommended. Report any side effects or adverse reactions to your outpatient psychiatrist. Patient is instructed to abstain from alcohol and illegal drugs while on prescription medications. In the event of worsening symptoms, patient is instructed to call the crisis hotline, 911, or go to the nearest emergency department for evaluation and treatment.     Allergies as of 07/14/2018      Reactions   Asa [aspirin] Anaphylaxis   Pork-derived Products Shortness Of Breath   Lithium Hives      Medication List    STOP taking these medications   amphetamine-dextroamphetamine 30 MG tablet Commonly known as:  ADDERALL   ARIPiprazole 5 MG tablet Commonly known as:  ABILIFY   ibuprofen 200 MG tablet Commonly known as:  ADVIL   IMPLANON [place]   Lurasidone HCl 120 MG Tabs   naproxen 500 MG tablet Commonly known as:  NAPROSYN   prazosin 2 MG capsule Commonly known as:  MINIPRESS   prenatal multivitamin Tabs tablet   Trintellix 20 MG Tabs tablet Generic drug:  vortioxetine HBr     TAKE these medications     Indication  atorvastatin 20 MG tablet Commonly known as:   LIPITOR Take 20 mg by mouth at bedtime.  Indication:  High Amount of Fats in the Blood   FLUoxetine 20 MG capsule Commonly known as:  PROZAC Take 1 capsule (20 mg total) by mouth daily. For mood/anxiety Start taking on:  Jul 15, 2018 What changed:  additional instructions  Indication:  Major Depressive Disorder   hydrOXYzine 25 MG tablet Commonly known as:  ATARAX/VISTARIL Take 1 tablet (25 mg total) by mouth at bedtime. For anxiety  Indication:  Feeling Anxious   multivitamin with minerals Tabs tablet Take 1 tablet by mouth daily.  Indication:  Supplementation   pantoprazole 40 MG tablet Commonly known as:  PROTONIX Take 1 tablet (40  mg total) by mouth daily. For reflux Start taking on:  Jul 15, 2018 What changed:  additional instructions  Indication:  Gastroesophageal Reflux Disease   QUEtiapine 200 MG tablet Commonly known as:  SEROQUEL Take 1 tablet (200 mg total) by mouth at bedtime. For mood  Indication:  Mood   traZODone 100 MG tablet Commonly known as:  DESYREL Take 1 tablet (100 mg total) by mouth at bedtime as needed for sleep. What changed:    when to take this  reasons to take this  Indication:  Trouble Sleeping      Follow-up Information    [facility] Follow up on 07/22/2018.   Specialty:  Behavioral Health Why:  Therapy appointment with Rene Kocher is Friday, 5/15 at 3:00p.  Office will inform Misty Stanley that you would like a new psychiatrist. They will contact you with an medication management appointment with a new psychiatrist.  Contact information: [address] [place] [address] [phone]           Follow-up recommendations: Activity as tolerated. Diet as recommended by primary care physician. Keep all scheduled follow-up appointments as recommended.   Comments:   Patient is instructed to take all prescribed medications as recommended. Report any side effects or adverse reactions to your outpatient  psychiatrist. Patient is instructed to abstain from alcohol and illegal drugs while on prescription medications. In the event of worsening symptoms, patient is instructed to call the crisis hotline, 911, or go to the nearest emergency department for evaluation and treatment.  Signed: Aldean Baker, NP 07/14/2018, 8:07 AM

## 2018-07-14 NOTE — Progress Notes (Signed)
  [facility] Adult Case Management Discharge Plan :  Will you be returning to the same living situation after discharge:  Yes,  patient reports she is returning to her apartment with roommates At discharge, do you have transportation home?: Yes,  patient reports she is taking an [place] or Lyft home for transportation Do you have the ability to pay for your medications: Yes,  Medicaid, SSDI  Release of information consent forms completed and in the chart;  Patient's signature needed at discharge.  Patient to Follow up at: Follow-up Information    [facility] Follow up on 07/22/2018.   Specialty:  Behavioral Health Why:  Therapy appointment with Rene Kocher is Friday, 5/15 at 3:00p.  Office will inform Misty Stanley that you would like a new psychiatrist. They will contact you with an medication management appointment with a new psychiatrist.  Contact information: [address] [place] [address] [phone]           Next level of care provider has access to [facility] Link:yes  Safety Planning and Suicide Prevention discussed: Yes,  with the patient   Have you used any form of tobacco in the last 30 days? (Cigarettes, Smokeless Tobacco, Cigars, and/or Pipes): No  Has patient been referred to the Quitline?: N/A patient is not a smoker  Patient has been referred for addiction treatment: N/A  Maeola Sarah, LCSWA 07/14/2018, 9:43 AM

## 2018-07-22 ENCOUNTER — Observation Stay (HOSPITAL_COMMUNITY)
Admission: AD | Admit: 2018-07-22 | Discharge: 2018-07-23 | Disposition: A | Discharge status: Home / Self Care | Payer: Medicaid Other | Source: Intra-hospital | Attending: Psychiatry | Admitting: Psychiatry

## 2018-07-22 ENCOUNTER — Encounter (HOSPITAL_COMMUNITY): Payer: Self-pay

## 2018-07-22 ENCOUNTER — Emergency Department (HOSPITAL_COMMUNITY)
Admission: EM | Admit: 2018-07-22 | Discharge: 2018-07-22 | Disposition: A | Discharge status: Other Acute Inpatient Hospital | Payer: Medicaid Other | Attending: Emergency Medicine | Admitting: Emergency Medicine

## 2018-07-22 ENCOUNTER — Other Ambulatory Visit: Payer: Self-pay

## 2018-07-22 DIAGNOSIS — Z1159 Encounter for screening for other viral diseases: Secondary | ICD-10-CM | POA: Insufficient documentation

## 2018-07-22 DIAGNOSIS — G4700 Insomnia, unspecified: Secondary | ICD-10-CM | POA: Insufficient documentation

## 2018-07-22 DIAGNOSIS — F3132 Bipolar disorder, current episode depressed, moderate: Secondary | ICD-10-CM | POA: Diagnosis present

## 2018-07-22 DIAGNOSIS — R45851 Suicidal ideations: Secondary | ICD-10-CM | POA: Diagnosis not present

## 2018-07-22 DIAGNOSIS — F603 Borderline personality disorder: Secondary | ICD-10-CM | POA: Diagnosis not present

## 2018-07-22 DIAGNOSIS — F209 Schizophrenia, unspecified: Secondary | ICD-10-CM | POA: Diagnosis not present

## 2018-07-22 DIAGNOSIS — F909 Attention-deficit hyperactivity disorder, unspecified type: Secondary | ICD-10-CM | POA: Insufficient documentation

## 2018-07-22 DIAGNOSIS — Z888 Allergy status to other drugs, medicaments and biological substances status: Secondary | ICD-10-CM | POA: Diagnosis not present

## 2018-07-22 DIAGNOSIS — Z79899 Other long term (current) drug therapy: Secondary | ICD-10-CM | POA: Insufficient documentation

## 2018-07-22 DIAGNOSIS — F840 Autistic disorder: Secondary | ICD-10-CM | POA: Insufficient documentation

## 2018-07-22 DIAGNOSIS — F332 Major depressive disorder, recurrent severe without psychotic features: Secondary | ICD-10-CM | POA: Insufficient documentation

## 2018-07-22 DIAGNOSIS — Z886 Allergy status to analgesic agent status: Secondary | ICD-10-CM | POA: Diagnosis not present

## 2018-07-22 DIAGNOSIS — F4310 Post-traumatic stress disorder, unspecified: Secondary | ICD-10-CM | POA: Insufficient documentation

## 2018-07-22 DIAGNOSIS — K219 Gastro-esophageal reflux disease without esophagitis: Secondary | ICD-10-CM | POA: Insufficient documentation

## 2018-07-22 DIAGNOSIS — F322 Major depressive disorder, single episode, severe without psychotic features: Secondary | ICD-10-CM | POA: Diagnosis present

## 2018-07-22 LAB — CBC WITH DIFFERENTIAL/PLATELET
Abs Immature Granulocytes: 0.04 10*3/uL (ref 0.00–0.07)
Basophils Absolute: 0 10*3/uL (ref 0.0–0.1)
Basophils Relative: 0 %
Eosinophils Absolute: 0.2 10*3/uL (ref 0.0–0.5)
Eosinophils Relative: 2 %
HCT: 42.3 % (ref 36.0–46.0)
Hemoglobin: 13.7 g/dL (ref 12.0–15.0)
Immature Granulocytes: 0 %
Lymphocytes Relative: 21 %
Lymphs Abs: 2.6 10*3/uL (ref 0.7–4.0)
MCH: 28.3 pg (ref 26.0–34.0)
MCHC: 32.4 g/dL (ref 30.0–36.0)
MCV: 87.4 fL (ref 80.0–100.0)
Monocytes Absolute: 0.7 10*3/uL (ref 0.1–1.0)
Monocytes Relative: 6 %
Neutro Abs: 8.8 10*3/uL — ABNORMAL HIGH (ref 1.7–7.7)
Neutrophils Relative %: 71 %
Platelets: 415 10*3/uL — ABNORMAL HIGH (ref 150–400)
RBC: 4.84 MIL/uL (ref 3.87–5.11)
RDW: 13.9 % (ref 11.5–15.5)
WBC: 12.3 10*3/uL — ABNORMAL HIGH (ref 4.0–10.5)
nRBC: 0 % (ref 0.0–0.2)

## 2018-07-22 LAB — COMPREHENSIVE METABOLIC PANEL
ALT: 31 U/L (ref 0–44)
AST: 18 U/L (ref 15–41)
Albumin: 3.7 g/dL (ref 3.5–5.0)
Alkaline Phosphatase: 122 U/L (ref 38–126)
Anion gap: 10 (ref 5–15)
BUN: 6 mg/dL (ref 6–20)
CO2: 23 mmol/L (ref 22–32)
Calcium: 9.4 mg/dL (ref 8.9–10.3)
Chloride: 106 mmol/L (ref 98–111)
Creatinine, Ser: 0.76 mg/dL (ref 0.44–1.00)
GFR calc Af Amer: >60 mL/min (ref >60)
GFR calc non Af Amer: >60 mL/min (ref >60)
Glucose, Bld: 87 mg/dL (ref 70–99)
Potassium: 3.7 mmol/L (ref 3.5–5.1)
Sodium: 139 mmol/L (ref 135–145)
Total Bilirubin: 0.9 mg/dL (ref 0.3–1.2)
Total Protein: 7.3 g/dL (ref 6.5–8.1)

## 2018-07-22 LAB — SARS CORONAVIRUS 2 BY RT PCR (HOSPITAL ORDER, PERFORMED IN ~~LOC~~ HOSPITAL LAB): SARS Coronavirus 2: NEGATIVE

## 2018-07-22 LAB — ACETAMINOPHEN LEVEL: Acetaminophen (Tylenol), Serum: <10 ug/mL — ABNORMAL LOW (ref 10–30)

## 2018-07-22 LAB — URINALYSIS, ROUTINE W REFLEX MICROSCOPIC
Bilirubin Urine: NEGATIVE
Glucose, UA: NEGATIVE mg/dL
Hgb urine dipstick: NEGATIVE
Ketones, ur: NEGATIVE mg/dL
Leukocytes,Ua: NEGATIVE
Nitrite: NEGATIVE
Protein, ur: NEGATIVE mg/dL
Specific Gravity, Urine: 1.024 (ref 1.005–1.030)
pH: 5 (ref 5.0–8.0)

## 2018-07-22 LAB — RAPID URINE DRUG SCREEN, HOSP PERFORMED
Amphetamines: NOT DETECTED
Barbiturates: NOT DETECTED
Benzodiazepines: NOT DETECTED
Cocaine: NOT DETECTED
Opiates: NOT DETECTED
Tetrahydrocannabinol: NOT DETECTED

## 2018-07-22 LAB — SALICYLATE LEVEL: Salicylate Lvl: <7 mg/dL (ref 2.8–30.0)

## 2018-07-22 LAB — ETHANOL: Alcohol, Ethyl (B): <10 mg/dL (ref <10)

## 2018-07-22 LAB — HCG, QUANTITATIVE, PREGNANCY: hCG, Beta Chain, Quant, S: <1 m[IU]/mL (ref <5)

## 2018-07-22 MED ORDER — HYDROXYZINE HCL 25 MG PO TABS
25.0000 mg | ORAL_TABLET | Freq: Every day | ORAL | Status: DC
  Administered 2018-07-22: 25 mg via ORAL

## 2018-07-22 MED ORDER — ALUM & MAG HYDROXIDE-SIMETH 200-200-20 MG/5ML PO SUSP: 30.0000 mL | ORAL | Status: DC | PRN

## 2018-07-22 MED ORDER — HYDROXYZINE HCL 25 MG PO TABS
ORAL_TABLET | ORAL | Status: AC
  Filled 2018-07-22: qty 1

## 2018-07-22 MED ORDER — MAGNESIUM HYDROXIDE 400 MG/5ML PO SUSP: 30.0000 mL | Freq: Every day | ORAL | Status: DC | PRN

## 2018-07-22 MED ORDER — TRAZODONE HCL 50 MG PO TABS
50.0000 mg | ORAL_TABLET | Freq: Every evening | ORAL | Status: DC | PRN
  Administered 2018-07-22: 50 mg via ORAL
  Filled 2018-07-22: qty 1

## 2018-07-22 MED ORDER — PANTOPRAZOLE SODIUM 40 MG PO TBEC
40.0000 mg | DELAYED_RELEASE_TABLET | Freq: Every day | ORAL | Status: DC
  Administered 2018-07-22 – 2018-07-23 (×2): 40 mg via ORAL
  Filled 2018-07-22 (×2): qty 1

## 2018-07-22 MED ORDER — ACETAMINOPHEN 325 MG PO TABS: 650.0000 mg | ORAL_TABLET | Freq: Four times a day (QID) | ORAL | Status: DC | PRN

## 2018-07-22 NOTE — Progress Notes (Signed)
[place] NOVEL CORONAVIRUS (COVID-19) DAILY CHECK-OFF SYMPTOMS - answer yes or no to each - every day NO YES  Have you had a fever in the past 24 hours?  . Fever (Temp > 37.80C / 100F) X   Have you had any of these symptoms in the past 24 hours? . New Cough .  Sore Throat  .  Shortness of Breath .  Difficulty Breathing .  Unexplained Body Aches   X   Have you had any one of these symptoms in the past 24 hours not related to allergies?   . Runny Nose .  Nasal Congestion .  Sneezing   X   If you have had runny nose, nasal congestion, sneezing in the past 24 hours, has it worsened?  X   EXPOSURES - check yes or no X   Have you traveled outside the state in the past 14 days?  X   Have you been in contact with someone with a confirmed diagnosis of COVID-19 or PUI in the past 14 days without wearing appropriate PPE?  X   Have you been living in the same home as a person with confirmed diagnosis of COVID-19 or a PUI (household contact)?    X   Have you been diagnosed with COVID-19?    X              What to do next: Answered NO to all: Answered YES to anything:   Proceed with unit schedule Follow the [facility] Inpatient Flowsheet.   

## 2018-07-22 NOTE — BH Assessment (Signed)
[facility] Assessment Progress Note Case was staffed with Money NP who recommended patient be observed and monitored for safety.

## 2018-07-22 NOTE — Progress Notes (Signed)
Pt accepted to [facility]; Observation room 203-1 Reola Calkins, NP is the accepting provider.   Dr. Lucianne Muss is the attending provider.   Call report to [phone]   [facility] ED notified.    Pt is voluntary and will be transported by [facility] Pt is scheduled to arrive at [facility] as soon as transportation is arranged.   Wells Guiles, LCSW, LCAS Disposition CSW [facility]/TTS [phone]

## 2018-07-22 NOTE — ED Notes (Signed)
Pt ambulatory to restroom
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
## 2018-07-22 NOTE — ED Notes (Signed)
Pt changed into purple scrubs 

## 2018-07-22 NOTE — ED Notes (Signed)
Per [facility] social work pt's COVID test needs to be collected before transferring to obs but does not need to result. Pt's covid test collected before discharge

## 2018-07-22 NOTE — ED Notes (Signed)
Called staffing office for [employer]. Sitter is not available at this time, but they informed this RN one will be sent when one comes available
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
## 2018-07-22 NOTE — ED Notes (Addendum)
TTS at bedside. 

## 2018-07-22 NOTE — Progress Notes (Signed)
Tamara [name] appears animated/anxious on approach. Pt denies SI/HI; Still endorsing AVH. Pt appears preoccupied and attention-seeking at times. Pt requested another dinner tray/fluids was given to her. Will continue to monitor in OBS.

## 2018-07-22 NOTE — ED Notes (Signed)
Pt belongings placed in locker 3 
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
## 2018-07-22 NOTE — Progress Notes (Signed)
[facility] NOVEL CORONAVIRUS (COVID-19) DAILY CHECK-OFF SYMPTOMS - answer yes or no to each - every day NO YES  Have you had a fever in the past 24 hours?  . Fever (Temp > 37.80C / 100F) X   Have you had any of these symptoms in the past 24 hours? . New Cough .  Sore Throat  .  Shortness of Breath .  Difficulty Breathing .  Unexplained Body Aches   X   Have you had any one of these symptoms in the past 24 hours not related to allergies?   . Runny Nose .  Nasal Congestion .  Sneezing   X   If you have had runny nose, nasal congestion, sneezing in the past 24 hours, has it worsened?  X   EXPOSURES - check yes or no X   Have you traveled outside the state in the past 14 days?  X   Have you been in contact with someone with a confirmed diagnosis of COVID-19 or PUI in the past 14 days without wearing appropriate PPE?  X   Have you been living in the same home as a person with confirmed diagnosis of COVID-19 or a PUI (household contact)?    X   Have you been diagnosed with COVID-19?    X              What to do next: Answered NO to all: Answered YES to anything:   Proceed with unit schedule Follow the [facility] Inpatient Flowsheet.   

## 2018-07-23 DIAGNOSIS — F3132 Bipolar disorder, current episode depressed, moderate: Secondary | ICD-10-CM | POA: Diagnosis not present

## 2018-07-23 NOTE — Progress Notes (Signed)
Patient ID: Tamara [name], female   DOB: 30-Apr-1995, 23 y.o.   MRN: [id number] D: Patient reports not sleeping for the past week. After taking the vistaril last night, she was able to sleep well. She reports the reason was increased anxiety and paranoia towards her significant other. She believes that she has an alternate personality that was telling her that they were verbally abusing her. She reports her SI is "gone" and denies HI. She continues to have AVH of a "black bunny in the corner of the room" and voices that tell her she is "pathetic." She is embarrassed about returning to the hospital so soon after being discharged. She is hyperverbal, sad and depressed.  A: Provided support and encouragement. Educated on medications. R: Patient compliant with medications.
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
## 2018-07-23 NOTE — Progress Notes (Signed)
D: Pt A & O X 3. Denies SI, HI, AVH and pain at this time. D/C home as ordered. Picked up in lobby via [employer]. A: D/C instructions reviewed with pt including prescriptions and follow up care, compliance encouraged. All belongings from locker #[id number] given to pt at time of departure. Scheduled and PRN medications given with verbal education and effects monitored. Safety checks maintained without incident till time of d/c.  R: Pt receptive to care. Compliant with medications when offered. Denies adverse drug reactions when assessed. Verbalized understanding related to d/c instructions. Signed belonging sheet in agreement with items received from locker. Ambulatory with a steady gait. Appears to be in no physical distress at time of departure.

## 2018-07-23 NOTE — Progress Notes (Signed)
   07/23/18 0818  COVID-19 Daily Checkoff  Have you had a fever (temp > 37.80C/100F)  in the past 24 hours?  No  If you have had runny nose, nasal congestion, sneezing in the past 24 hours, has it worsened? No  COVID-19 EXPOSURE  Have you traveled outside the state in the past 14 days? No  Have you been in contact with someone with a confirmed diagnosis of COVID-19 or PUI in the past 14 days without wearing appropriate PPE? No  Have you been living in the same home as a person with confirmed diagnosis of COVID-19 or a PUI (household contact)? No  Have you been diagnosed with COVID-19? No

## 2018-07-27 ENCOUNTER — Observation Stay (HOSPITAL_COMMUNITY)
Admission: RE | Admit: 2018-07-27 | Discharge: 2018-07-28 | Disposition: A | Discharge status: Home / Self Care | Payer: Medicaid Other | Attending: Psychiatry | Admitting: Psychiatry

## 2018-07-27 DIAGNOSIS — Z79899 Other long term (current) drug therapy: Secondary | ICD-10-CM | POA: Diagnosis not present

## 2018-07-27 DIAGNOSIS — Z888 Allergy status to other drugs, medicaments and biological substances status: Secondary | ICD-10-CM | POA: Diagnosis not present

## 2018-07-27 DIAGNOSIS — F840 Autistic disorder: Secondary | ICD-10-CM | POA: Insufficient documentation

## 2018-07-27 DIAGNOSIS — R569 Unspecified convulsions: Secondary | ICD-10-CM | POA: Insufficient documentation

## 2018-07-27 DIAGNOSIS — F322 Major depressive disorder, single episode, severe without psychotic features: Secondary | ICD-10-CM | POA: Diagnosis not present

## 2018-07-27 DIAGNOSIS — G8929 Other chronic pain: Secondary | ICD-10-CM | POA: Diagnosis not present

## 2018-07-27 DIAGNOSIS — M549 Dorsalgia, unspecified: Secondary | ICD-10-CM | POA: Diagnosis not present

## 2018-07-27 DIAGNOSIS — F609 Personality disorder, unspecified: Secondary | ICD-10-CM | POA: Diagnosis not present

## 2018-07-27 DIAGNOSIS — Z886 Allergy status to analgesic agent status: Secondary | ICD-10-CM | POA: Insufficient documentation

## 2018-07-27 DIAGNOSIS — R45851 Suicidal ideations: Secondary | ICD-10-CM | POA: Insufficient documentation

## 2018-07-27 DIAGNOSIS — F323 Major depressive disorder, single episode, severe with psychotic features: Secondary | ICD-10-CM | POA: Diagnosis present

## 2018-07-27 DIAGNOSIS — K219 Gastro-esophageal reflux disease without esophagitis: Secondary | ICD-10-CM | POA: Insufficient documentation

## 2018-07-27 DIAGNOSIS — F603 Borderline personality disorder: Principal | ICD-10-CM | POA: Diagnosis present

## 2018-07-27 DIAGNOSIS — F909 Attention-deficit hyperactivity disorder, unspecified type: Secondary | ICD-10-CM | POA: Insufficient documentation

## 2018-07-27 DIAGNOSIS — F4310 Post-traumatic stress disorder, unspecified: Secondary | ICD-10-CM | POA: Insufficient documentation

## 2018-07-27 MED ORDER — LORAZEPAM 1 MG PO TABS: 1.0000 mg | ORAL_TABLET | ORAL | Status: DC | PRN

## 2018-07-27 MED ORDER — ZIPRASIDONE MESYLATE 20 MG IM SOLR: 20.0000 mg | INTRAMUSCULAR | Status: DC | PRN

## 2018-07-27 MED ORDER — OLANZAPINE 10 MG PO TBDP: 10.0000 mg | ORAL_TABLET | Freq: Three times a day (TID) | ORAL | Status: DC | PRN

## 2018-07-27 MED ORDER — ACETAMINOPHEN 325 MG PO TABS: 650.0000 mg | ORAL_TABLET | Freq: Four times a day (QID) | ORAL | Status: DC | PRN

## 2018-07-27 MED ORDER — TRAZODONE HCL 100 MG PO TABS: 100.0000 mg | ORAL_TABLET | Freq: Every evening | ORAL | Status: DC | PRN

## 2018-07-27 MED ORDER — ALUM & MAG HYDROXIDE-SIMETH 200-200-20 MG/5ML PO SUSP: 30.0000 mL | ORAL | Status: DC | PRN

## 2018-07-27 MED ORDER — MAGNESIUM HYDROXIDE 400 MG/5ML PO SUSP: 30.0000 mL | Freq: Every day | ORAL | Status: DC | PRN

## 2018-07-27 MED ORDER — HYDROXYZINE HCL 25 MG PO TABS: 25.0000 mg | ORAL_TABLET | Freq: Four times a day (QID) | ORAL | Status: DC | PRN

## 2018-07-27 NOTE — H&P (Signed)
BH Observation Unit Provider Admission PAA/H&P  Patient Identification: Tamara [name] MRN:  [id number] Date of Evaluation:  07/27/2018 Chief Complaint:  borderline Principal Diagnosis: <principal problem not specified> Diagnosis:  Active Problems:   MDD (major depressive disorder), single episode, severe with psychotic features (HCC)  History of Present Illness:Per TTS Tamara [name], Tamara [name] is a 23 y.o. female who was brought to [facility] via police at the recommendation of [facility] due to having SI because of the things a stalker has been saying. During the assessment, pt states she goes by "Tamara [name]" and answers the first few questions, then states she is tired and states she is going to let a different personality take over; pt then closes her eyes, puts her head down, and then lifts her head back up after several seconds, opens her eyes, and asks where she is. Pt's voice was slightly higher than previously but, after talking for several minutes, her voice was at the same pitch as before. Pt made many self-depreciating comments about herself and apologized after every question, especially if she didn't know the answer, and would then make the negative comment about herself, including, but not limited to, "I'm no good, I'm sorry," "I'm sorry, I don't deserve to be here," and "I'm sorry I'm so stupid." Clinician did not feed into these statements, nor did clinician feed into pt's claim that she had multiple personalities, and instead continued with the assessment.  Pt was d/c from [facility] on 07/23/2018 after an overnight stay due to SI. Pt had a plan to kill herself by drinking bleach after she had been teased by her roommates and received death threats from an unknown person on social media. Pt did not note these incidents during today's assessment. Pt denies HI, any engagement with the legal system, access to guns/weapons, or any SA. Pt states she has not engaged in NSSIB via cutting since  January 2020. She shares she experiences AH when she hears the things her stalker says to her and she experiences VH in the form of shadow people and black boxes with red eyes. Pt states she had her first appointment with a psychiatrist named Tamara [name] at [facility] yesterday (07/26/2018).  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 When clinician states that she must be good at her job if she's able to sell things via telephone, pt states that she (her current personality) writes the script and her other personality reads them and does all the talking, as she's better at that. 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 Pt's negative statements about herself appear very forced and rehearsed, as they're very blunt and emotionless, without tears or explanation.  Pt is unable to provide the medication she is prescribed, though she reports she takes it as prescribed. She states she lives with a roommate in student housing, though she doesn't like the roommate. Pt reports family SA, MH, and SI, though her chart lists no such hx. Pt states she has no support from anyone.  Pt is oriented x4. Her recent and remote memory appears to be intact. Pt was overall cooperative, though it is quite obvious that pt is not being honest regarding the mental health symptoms she is experiencing, which could result  in being a disservice to herself and receiving the correct care for her true symptoms. Pt's insight, judgement, and impulse control is poor at this time.  Associated Signs/Symptoms: Depression Symptoms:  psychomotor agitation, disturbed sleep, (Hypo) Manic Symptoms:  Delusions, Anxiety Symptoms:  Excessive Worry, Psychotic Symptoms:  Dissociative d/o PTSD  Symptoms: Negative Total Time spent with patient: 30 minutes  Past Psychiatric History: see above  Is the patient at risk to self? No.  Has the patient been a risk to self in the past 6 months? No.  Has the patient been a risk to self within the distant past? No.  Is the patient a risk to others? No.  Has the patient been a risk to others in the past 6 months? No.  Has the patient been a risk to others within the distant past? No.   Prior Inpatient Therapy: Prior Inpatient Therapy: Yes Prior Therapy Dates: 2020, 2019 Prior Therapy Facilty/Provider(s): [facility] Reason for Treatment: MH issues Prior Outpatient Therapy: Prior Outpatient Therapy: Yes Prior Therapy Dates: Saw a therapist for approximately 1 yr; cannot remember details Prior Therapy Facilty/Provider(s): Pt cannot remember Reason for Treatment: MH Does patient have an ACCT team?: No Does patient have Intensive In-House Services?  : No Does patient have [facility] services? : No Does patient have P4CC services?: No  Alcohol Screening:   Substance Abuse History in the last 12 months:  No. Consequences of Substance Abuse: NA Previous Psychotropic Medications: Yes  Psychological Evaluations: Yes  Past Medical History:  Past Medical History:  Diagnosis Date  . ADHD   . Autism   . Bipolar disorder (HCC)   . Chronic back pain    "mostly lower; sometimes all over" (03/22/2017)  . Chronic stomach ulcer   . Depression   . GERD (gastroesophageal reflux disease)   . Headache    "a few/week" (03/22/2017)  . Migraine    "monthly recently" (03/22/2017)  . Personality disorder (HCC)   . PTSD (post-traumatic stress disorder)   . Seizures (HCC)    "very random; I lose memory of what happens; usually happens when I'm in bed; probably 2-3/year" (03/22/2017)   No past surgical history on file. Family History:  Family History  Problem Relation Age of Onset  . Diabetes Mother   . Hypertension Maternal Grandmother    . Hyperlipidemia Maternal Grandmother   . Diabetes Maternal Grandmother    Family Psychiatric History:Unknown Tobacco Screening:  n/a Social History:  Social History   Substance and Sexual Activity  Alcohol Use No  . Alcohol/week: 0.0 standard drinks     Social History   Substance and Sexual Activity  Drug Use No    Additional Social History: Marital status: Single    Pain Medications: Please see MAR Prescriptions: Please see MAR Over the Counter: Please see MAR History of alcohol / drug use?: No history of alcohol / drug abuse Longest period of sobriety (when/how long): Pt denies SA                    Allergies:   Allergies  Allergen Reactions  . Asa [Aspirin] Anaphylaxis  . Pork-Derived Products Shortness Of Breath  . Lithium Hives   Lab Results: No results found for this or any previous visit (from the past 48 hour(s)).  Blood Alcohol level:  Lab Results  Component Value Date   [facility] <10 07/22/2018   ETH <10 07/08/2018    Metabolic Disorder Labs:  Lab Results  Component Value  Date   HGBA1C 5.1 07/11/2018   MPG 99.67 07/11/2018   MPG 102.54 01/27/2018   Lab Results  Component Value Date   PROLACTIN 94.3 (H) 01/27/2018   Lab Results  Component Value Date   CHOL 172 07/11/2018   TRIG 166 (H) 07/11/2018   HDL 38 (L) 07/11/2018   CHOLHDL 4.5 07/11/2018   VLDL 33 07/11/2018   LDLCALC 101 (H) 07/11/2018   LDLCALC 68 01/27/2018    Current Medications: Current Facility-Administered Medications  Medication Dose Route Frequency Provider Last Rate Last Dose  . acetaminophen (TYLENOL) tablet 650 mg  650 mg Oral Q6H PRN Kerry HoughSimon, Verleen Stuckey E, PA-C      . alum & mag hydroxide-simeth (MAALOX/MYLANTA) 200-200-20 MG/5ML suspension 30 mL  30 mL Oral Q4H PRN Kerry HoughSimon, Henleigh Robello E, PA-C      . hydrOXYzine (ATARAX/VISTARIL) tablet 25 mg  25 mg Oral Q6H PRN Donell SievertSimon, Nani Ingram E, PA-C      . OLANZapine zydis (ZYPREXA) disintegrating tablet 10 mg  10 mg Oral Q8H PRN Kerry HoughSimon,  Yaneth Fairbairn E, PA-C       And  . LORazepam (ATIVAN) tablet 1 mg  1 mg Oral PRN Donell SievertSimon, Khala Tarte E, PA-C       And  . ziprasidone (GEODON) injection 20 mg  20 mg Intramuscular PRN Donell SievertSimon, Diyari Cherne E, PA-C      . magnesium hydroxide (MILK OF MAGNESIA) suspension 30 mL  30 mL Oral Daily PRN Kerry HoughSimon, Selin Eisler E, PA-C      . [START ON 07/28/2018] traZODone (DESYREL) tablet 100 mg  100 mg Oral QHS,MR X 1 Almon Whitford E, PA-C       PTA Medications: Medications Prior to Admission  Medication Sig Dispense Refill Last Dose  . atorvastatin (LIPITOR) 20 MG tablet Take 20 mg by mouth at bedtime.    07/07/2018 at pm  . FLUoxetine (PROZAC) 20 MG capsule Take 1 capsule (20 mg total) by mouth daily. For mood/anxiety 30 capsule 0   . hydrOXYzine (ATARAX/VISTARIL) 25 MG tablet Take 1 tablet (25 mg total) by mouth at bedtime. For anxiety 30 tablet 0   . Multiple Vitamin (MULTIVITAMIN WITH MINERALS) TABS tablet Take 1 tablet by mouth daily.   07/08/2018 at am  . pantoprazole (PROTONIX) 40 MG tablet Take 1 tablet (40 mg total) by mouth daily. For reflux 30 tablet 0   . QUEtiapine (SEROQUEL) 200 MG tablet Take 1 tablet (200 mg total) by mouth at bedtime. For mood 30 tablet 0   . traZODone (DESYREL) 100 MG tablet Take 1 tablet (100 mg total) by mouth at bedtime as needed for sleep. 30 tablet 0     Musculoskeletal: Strength & Muscle Tone: within normal limits Gait & Station: normal Patient leans: N/A  Psychiatric Specialty Exam: Physical Exam  Constitutional: She is oriented to person, place, and time. She appears well-developed and well-nourished. No distress.  HENT:  Head: Normocephalic.  Eyes: Pupils are equal, round, and reactive to light.  Respiratory: Effort normal and breath sounds normal. No respiratory distress.  Neurological: She is alert and oriented to person, place, and time. No cranial nerve deficit.  Skin: Skin is warm and dry. She is not diaphoretic.  Psychiatric: Her mood appears anxious. Her affect is  inappropriate. Her speech is tangential. She is actively hallucinating. Cognition and memory are impaired. She expresses impulsivity and inappropriate judgment. She expresses no homicidal and no suicidal ideation. She expresses no suicidal plans and no homicidal plans.    Review of Systems  Constitutional:  Negative for chills, diaphoresis, fever, malaise/fatigue and weight loss.  Psychiatric/Behavioral: Positive for hallucinations. Negative for depression, memory loss, substance abuse and suicidal ideas. The patient is nervous/anxious. The patient does not have insomnia.   All other systems reviewed and are negative.   There were no vitals taken for this visit.There is no height or weight on file to calculate BMI.  General Appearance: Casual  Eye Contact:  Good  Speech:  Clear and Coherent  Volume:  Normal  Mood:  Depressed  Affect:  Full Range  Thought Process:  Disorganized  Orientation:  Full (Time, Place, and Person)  Thought Content:  Illogical  Suicidal Thoughts:  No  Homicidal Thoughts:  No  Memory:  Immediate;   Fair  Judgement:  Impaired  Insight:  Lacking  Psychomotor Activity:  Normal  Concentration:  Concentration: Poor  Recall:  Poor  Fund of Knowledge:  Poor  Language:  Fair  Akathisia:  Negative  Handed:  Right  AIMS (if indicated):     Assets:  Social Support  ADL's:  Intact  Cognition:  WNL  Sleep:         Treatment Plan Summary: Daily contact with patient to assess and evaluate symptoms and progress in treatment  Observation Level/Precautions:  15 minute checks Laboratory:  Chemistry Profile Psychotherapy:   Medications:   Consultations:   Discharge Concerns:   Estimated LOS: Other:      Kerry Hough, PA-C 5/20/202011:08 PM

## 2018-07-27 NOTE — BH Assessment (Addendum)
Assessment Note  Tamara [name] is a 23 y.o. female who was brought to [facility] via police at the recommendation of [facility] due to having SI because of the things a stalker has been saying. During the assessment, pt states she goes by "[name]" and answers the first few questions, then states she is tired and states she is going to let a different personality take over; pt then closes her eyes, puts her head down, and then lifts her head back up after several seconds, opens her eyes, and asks where she is. Pt's voice was slightly higher than previously but, after talking for several minutes, her voice was at the same pitch as before. Pt made many self-depreciating comments about herself and apologized after every question, especially if she didn't know the answer, and would then make the negative comment about herself, including, but not limited to, "I'm no good, I'm sorry," "I'm sorry, I don't deserve to be here," and "I'm sorry I'm so stupid." Clinician did not feed into these statements, nor did clinician feed into pt's claim that she had multiple personalities, and instead continued with the assessment.  Pt was d/c from [facility] on 07/23/2018 after an overnight stay due to SI. Pt had a plan to kill herself by drinking bleach after she had been teased by her roommates and received death threats from an unknown person on social media. Pt did not note these incidents during today's assessment. Pt denies HI, any engagement with the legal system, access to guns/weapons, or any SA. Pt states she has not engaged in NSSIB via cutting since January 2020. She shares she experiences AH when she hears the things her stalker says to her and she experiences VH in the form of shadow people and black boxes with red eyes. Pt states she had her first appointment with a psychiatrist named Timothy at [facility] yesterday (07/26/2018).  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 When clinician states that she must be good at her job if she's able to sell things via telephone, pt states that she (her current personality) writes the script and her other personality reads them and does all the talking, as she's better at that. 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 Pt's negative statements about herself appear very forced and rehearsed, as they're very blunt and emotionless, without tears or explanation.  Pt is unable to provide the medication she is prescribed, though she reports she takes it as prescribed. She states she lives with a roommate in student housing, though she doesn't like the roommate. Pt reports family SA, MH, and SI, though her chart lists no such hx. Pt states she has no support from anyone.  Pt is oriented x4. Her recent and remote memory appears to be intact. Pt was overall cooperative, though it is quite obvious that pt is not being honest regarding the mental health symptoms she is experiencing, which could result in being a disservice to herself and receiving the correct care for her true symptoms. Pt's insight, judgement, and impulse control is poor at this time.   Diagnosis: F60.3, Borderline personality disorder   Past Medical History:  Past Medical History:  Diagnosis Date  . ADHD   .  Autism   . Bipolar disorder (HCC)   . Chronic back pain    "mostly lower; sometimes all over" (03/22/2017)  . Chronic stomach ulcer   . Depression   . GERD (gastroesophageal reflux disease)   . Headache    "a few/week" (03/22/2017)  . Migraine    "monthly recently" (03/22/2017)  . Personality disorder (HCC)   . PTSD (post-traumatic stress disorder)   . Seizures (HCC)    "very random; I lose memory of what happens; usually happens when I'm  in bed; probably 2-3/year" (03/22/2017)    No past surgical history on file.  Family History:  Family History  Problem Relation Age of Onset  . Diabetes Mother   . Hypertension Maternal Grandmother   . Hyperlipidemia Maternal Grandmother   . Diabetes Maternal Grandmother     Social History:  reports that she has never smoked. She has never used smokeless tobacco. She reports that she does not drink alcohol or use drugs.  Additional Social History:  Alcohol / Drug Use Pain Medications: Please see MAR Prescriptions: Please see MAR Over the Counter: Please see MAR History of alcohol / drug use?: No history of alcohol / drug abuse Longest period of sobriety (when/how long): Pt denies SA  CIWA:   COWS:    Allergies:  Allergies  Allergen Reactions  . Asa [Aspirin] Anaphylaxis  . Pork-Derived Products Shortness Of Breath  . Lithium Hives    Home Medications: (Not in a hospital admission)   OB/GYN Status:  No LMP recorded. Patient has had an implant.  General Assessment Data Location of Assessment: [facility] Assessment Services TTS Assessment: In system Is this a Tele or Face-to-Face Assessment?: Face-to-Face Is this an Initial Assessment or a Re-assessment for this encounter?: Initial Assessment Patient Accompanied by:: N/A Language Other than English: No Living Arrangements: Other (Comment)(Pt lives with a roommate in student housing) What gender do you identify as?: Female Marital status: Single Maiden name: Stoddart Pregnancy Status: No Living Arrangements: Non-relatives/Friends Can pt return to current living arrangement?: Yes Admission Status: Voluntary Is patient capable of signing voluntary admission?: Yes Referral Source: Self/Family/Friend Insurance type: [place] [facility] Medicaid  Medical Screening Exam [facility] Walk-in ONLY) Medical Exam completed: Yes  Crisis Care Plan Living Arrangements: Non-relatives/Friends Legal Guardian: Other:(Self) Name of Psychiatrist:  Ananias Pilgrim, reports she has been seeing him for 1 year Name of Therapist: None  Education Status Is patient currently in school?: Yes Current Grade: Junior in [employer] at [employer] grade of school patient has completed: Sophomore year at [employer] Name of school: Haematologist person: Self IEP information if applicable: N/A Is the patient employed, unemployed or receiving disability?: Employed  Risk to self with the past 6 months Suicidal Ideation: Yes-Currently Present Has patient been a risk to self within the past 6 months prior to admission? : Yes Suicidal Intent: No Has patient had any suicidal intent within the past 6 months prior to admission? : Yes Is patient at risk for suicide?: No Suicidal Plan?: No Has patient had any suicidal plan within the past 6 months prior to admission? : Yes What has been your use of drugs/alcohol within the last 12 months?: Pt denies use of substances Previous Attempts/Gestures: Yes How many times?: 1 Other Self Harm Risks: Pt is not providing accurate information re: MH symptoms Triggers for Past Attempts: None known Intentional Self Injurious Behavior: Cutting(Hx of NSSIB via cutting; last incident was January 2020) Comment - Self Injurious Behavior: Hx of NSSIB via cutting; last incident  was January 2020 Family Suicide History: Yes(Reports hx of mother, maternal gma, maternal uncle) Recent stressful life event(s): Turmoil (Comment)(Reports difficulties getting along with her roommate) Persecutory voices/beliefs?: No Depression: No Depression Symptoms: Insomnia, Guilt, Feeling worthless/self pity Substance abuse history and/or treatment for substance abuse?: No Suicide prevention information given to non-admitted patients: Not applicable  Risk to Others within the past 6 months Homicidal Ideation: No Does patient have any lifetime risk of violence toward others beyond the six months prior to admission? : No Thoughts of Harm to Others:  No Current Homicidal Intent: No Current Homicidal Plan: No Access to Homicidal Means: No Identified Victim: None noted History of harm to others?: No Assessment of Violence: On admission Violent Behavior Description: None noted Does patient have access to weapons?: No(Pt denies access to weapons/guns) Criminal Charges Pending?: No Does patient have a court date: No Is patient on probation?: No  Psychosis Hallucinations: None noted Delusions: Somatic(Claims has multiple & is "switching" personalities at will)  Mental Status Report Appearance/Hygiene: Unremarkable Eye Contact: Good Motor Activity: Unremarkable Speech: Slow Level of Consciousness: Alert Mood: Worthless, low self-esteem Affect: Blunted Anxiety Level: None Thought Processes: Circumstantial Judgement: Impaired Orientation: Person, Place, Time, Situation Obsessive Compulsive Thoughts/Behaviors: Severe  Cognitive Functioning Concentration: Decreased Memory: Unable to Assess Is patient IDD: No Insight: Poor Impulse Control: Fair Appetite: Fair Have you had any weight changes? : No Change Sleep: Decreased Total Hours of Sleep: 4 Vegetative Symptoms: None  ADLScreening [facility] Assessment Services) Patient's cognitive ability adequate to safely complete daily activities?: Yes Patient able to express need for assistance with ADLs?: Yes Independently performs ADLs?: Yes (appropriate for developmental age)  Prior Inpatient Therapy Prior Inpatient Therapy: Yes Prior Therapy Dates: 2020, 2019 [facility] Reason for Treatment: MH issues  Prior Outpatient Therapy Prior Outpatient Therapy: Yes Prior Therapy Dates: Saw a therapist for approximately 1 yr; cannot remember details Prior Therapy Facilty/Provider(s): Pt cannot remember Reason for Treatment: MH Does patient have an ACCT team?: No Does patient have Intensive In-House Services?  : No Does patient have  [facility] services? : No Does patient have P4CC services?: No  ADL Screening (condition at time of admission) Patient's cognitive ability adequate to safely complete daily activities?: Yes Is the patient deaf or have difficulty hearing?: No Does the patient have difficulty seeing, even when wearing glasses/contacts?: No Does the patient have difficulty concentrating, remembering, or making decisions?: No Patient able to express need for assistance with ADLs?: Yes Does the patient have difficulty dressing or bathing?: No Independently performs ADLs?: Yes (appropriate for developmental age) Does the patient have difficulty walking or climbing stairs?: No Weakness of Legs: None Weakness of Arms/Hands: None  Home Assistive Devices/Equipment Home Assistive Devices/Equipment: None  Therapy Consults (therapy consults require a physician order) PT Evaluation Needed: No OT Evalulation Needed: No SLP Evaluation Needed: No Abuse/Neglect Assessment (Assessment to be complete while patient is alone) Abuse/Neglect Assessment Can Be Completed: Unable to assess, patient is non-responsive or altered mental status(Pt was answering questions re: abuse, but it is unclear at this time if she was answering for herself or for the personalities she has created.) Values / Beliefs Cultural Requests During Hospitalization: None Spiritual Requests During Hospitalization: None Consults Spiritual Care Consult Needed: No Social Work Consult Needed: No Merchant navy officerAdvance Directives (For Healthcare) Does Patient Have a Medical Advance Directive?: Unable to assess, patient is non-responsive or altered mental status        Disposition: Donell SievertSpencer Simon, PA, reviewed pt  chart and information and determined pt should be observed overnight for safety and stabilization due to SI. This information was provided to pt's nurse at 2110.   Disposition Initial Assessment Completed for this Encounter: Yes Disposition of Patient:  Admit(Spencer Simon, PA determined pt should be observed overnight) Type of inpatient treatment program: Adult Patient refused recommended treatment: No Mode of transportation if patient is discharged/movement?: N/A Patient referred to: Other (Comment)(Pt will be observed overnight for safety and stability)  On Site Evaluation by:   Reviewed with Physician:    Ralph Dowdy 07/27/2018 9:01 PM

## 2018-07-28 ENCOUNTER — Other Ambulatory Visit: Payer: Self-pay

## 2018-07-28 ENCOUNTER — Encounter (HOSPITAL_COMMUNITY): Payer: Self-pay

## 2018-07-28 DIAGNOSIS — F323 Major depressive disorder, single episode, severe with psychotic features: Secondary | ICD-10-CM | POA: Diagnosis not present

## 2018-07-28 DIAGNOSIS — F603 Borderline personality disorder: Secondary | ICD-10-CM | POA: Diagnosis not present

## 2018-07-28 LAB — TSH: TSH: 4.416 u[IU]/mL (ref 0.350–4.500)

## 2018-07-28 LAB — COMPREHENSIVE METABOLIC PANEL
ALT: 20 U/L (ref 0–44)
AST: 16 U/L (ref 15–41)
Albumin: 3.7 g/dL (ref 3.5–5.0)
Alkaline Phosphatase: 127 U/L — ABNORMAL HIGH (ref 38–126)
Anion gap: 9 (ref 5–15)
BUN: 11 mg/dL (ref 6–20)
CO2: 24 mmol/L (ref 22–32)
Calcium: 9.1 mg/dL (ref 8.9–10.3)
Chloride: 107 mmol/L (ref 98–111)
Creatinine, Ser: 0.76 mg/dL (ref 0.44–1.00)
GFR calc Af Amer: >60 mL/min (ref >60)
GFR calc non Af Amer: >60 mL/min (ref >60)
Glucose, Bld: 91 mg/dL (ref 70–99)
Potassium: 3.6 mmol/L (ref 3.5–5.1)
Sodium: 140 mmol/L (ref 135–145)
Total Bilirubin: 0.5 mg/dL (ref 0.3–1.2)
Total Protein: 7.6 g/dL (ref 6.5–8.1)

## 2018-07-28 LAB — URINALYSIS, ROUTINE W REFLEX MICROSCOPIC
Bilirubin Urine: NEGATIVE
Glucose, UA: NEGATIVE mg/dL
Hgb urine dipstick: NEGATIVE
Ketones, ur: NEGATIVE mg/dL
Leukocytes,Ua: NEGATIVE
Nitrite: NEGATIVE
Protein, ur: NEGATIVE mg/dL
Specific Gravity, Urine: 1.028 (ref 1.005–1.030)
pH: 5 (ref 5.0–8.0)

## 2018-07-28 LAB — LIPID PANEL
Cholesterol: 198 mg/dL (ref 0–200)
HDL: 46 mg/dL (ref >40)
LDL Cholesterol: 135 mg/dL — ABNORMAL HIGH (ref 0–99)
Total CHOL/HDL Ratio: 4.3 RATIO
Triglycerides: 84 mg/dL (ref <150)
VLDL: 17 mg/dL (ref 0–40)

## 2018-07-28 LAB — CBC
HCT: 40.3 % (ref 36.0–46.0)
Hemoglobin: 13 g/dL (ref 12.0–15.0)
MCH: 29 pg (ref 26.0–34.0)
MCHC: 32.3 g/dL (ref 30.0–36.0)
MCV: 89.8 fL (ref 80.0–100.0)
Platelets: 425 10*3/uL — ABNORMAL HIGH (ref 150–400)
RBC: 4.49 MIL/uL (ref 3.87–5.11)
RDW: 13.9 % (ref 11.5–15.5)
WBC: 15.5 10*3/uL — ABNORMAL HIGH (ref 4.0–10.5)
nRBC: 0 % (ref 0.0–0.2)

## 2018-07-28 NOTE — Progress Notes (Signed)
Tamara [name] is a 23 year old female being admitted voluntarily to [facility] OBS unit room 206.  She came in for suicidal ideation and increasing paranoia.  She believes that she has multiple personalities and refers to her other personality as "Lilian."  During [facility] admission, she continually sad "sorry" but continued to ask multiple questions about stalkers and how they act.  She did report A/V hallucinations.  She continues to voice passive SI and agrees to not harm herself while on the unit.  Oriented her to the unit.  BH-OBS paperwork completed and signed.  Belongings secured in tamper resistant bag and placed in locker # 17.  No contraband found.  Skin assessment completed and no skin issues noted.  Q 15 minute checks initiated for safety.

## 2018-07-28 NOTE — Progress Notes (Signed)
Patient ID: Tamara [name], female   DOB: Aug 07, 1995, 23 y.o.   MRN: [id number]   D: Pt alert and oriented on the unit.   A: Education, support, encouragement, and resources provided. Pt's belongings returned and belongings returned and belongings sheet signed.  R: Pt denies SI/HI, A/VH, pain, or any concerns at this time. Pt ambulatory on and off unit. Pt discharged to triage lobby.
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
## 2018-07-28 NOTE — Discharge Instructions (Addendum)
Please follow up with therapy resources. The therapists listed below specialize in trauma and dissociative disorders:   1. [address] [place], [place], [facility], [place]     [address]     [place], [place] [address]     [phone]  2. [address] [facility], [facility]     [place], [place] [address]     [phone]

## 2018-07-28 NOTE — Progress Notes (Signed)
Pt meets inpatient criteria per Donell Sievert, PA. Referral information has been sent to the following hospitals for review:  [facility], [facility], [facility], [address], [address] [place], [facility], [facility], [facility]  Disposition will continue to assist with inpatient placement needs.   Wells Guiles, LCSW, LCAS Disposition CSW [facility]/TTS [phone]

## 2018-07-28 NOTE — Plan of Care (Signed)
[facility] Observation Crisis Plan  Reason for Crisis Plan:  Crisis Stabilization   Plan of Care:  Referral for Telepsychiatry/Psychiatric Consult  Family Support:      Current Living Environment:  Living Arrangements: Non-relatives/Friends  Insurance:   Hospital Account    Name Acct ID Class Status Primary Coverage   [place], [place] [id number] BEHAVIORAL HEALTH OBSERVATION Open [place]        Guarantor Account (for Hospital Account [id number])    Name Relation to Pt Service Area Active? Acct Type   Montez Morita Self [facility] Yes Behavioral Health   Address Phone       [address] [place], [place] [address] [phone])          Coverage Information (for Hospital Account [id number])    F/O Payor/Plan Precert #   [facility] [place]    Subscriber Subscriber #   Elicia, G[place]es [id number]   Address Phone   [address] [place], [place] [address] [phone]      Legal Guardian:  Legal Guardian: Other:(Self)  Primary Care Provider:  Medicine, [facility]  Current Outpatient Providers:  [facility]  Psychiatrist:  Name of Psychiatrist: Ananias Pilgrim, reports she has been seeing him for 1 year  Counselor/Therapist:  Name of Therapist: None  Compliant with Medications:  Yes  Additional Information:   Levin Bacon 5/21/20208:29 AM

## 2018-07-28 NOTE — Progress Notes (Signed)
Patient ID: Tamara [name], female   DOB: 06/21/1995, 22 y.o.   MRN: [id number]  [place] NOVEL CORONAVIRUS (COVID-19) DAILY CHECK-OFF SYMPTOMS - answer yes or no to each - every day NO YES  Have you had a fever in the past 24 hours?  . Fever (Temp > 37.80C / 100F) X   Have you had any of these symptoms in the past 24 hours? . New Cough .  Sore Throat  .  Shortness of Breath .  Difficulty Breathing .  Unexplained Body Aches   X   Have you had any one of these symptoms in the past 24 hours not related to allergies?   . Runny Nose .  Nasal Congestion .  Sneezing   X   If you have had runny nose, nasal congestion, sneezing in the past 24 hours, has it worsened?  X   EXPOSURES - check yes or no X   Have you traveled outside the state in the past 14 days?  X   Have you been in contact with someone with a confirmed diagnosis of COVID-19 or PUI in the past 14 days without wearing appropriate PPE?  X   Have you been living in the same home as a person with confirmed diagnosis of COVID-19 or a PUI (household contact)?    X   Have you been diagnosed with COVID-19?    X              What to do next: Answered NO to all: Answered YES to anything:   Proceed with unit schedule Follow the [facility] Inpatient Flowsheet.    

## 2018-07-28 NOTE — Progress Notes (Signed)
Pt woke up this morning complaining of having nightmares.  She was informed to talk with MD today about her concerns.

## 2018-07-29 LAB — DRUG PROFILE, UR, 9 DRUGS (LABCORP)
Amphetamines, Urine: NEGATIVE ng/mL
Barbiturate, Ur: NEGATIVE ng/mL
Benzodiazepine Quant, Ur: NEGATIVE ng/mL
Cannabinoid Quant, Ur: NEGATIVE ng/mL
Cocaine (Metab.): NEGATIVE ng/mL
Methadone Screen, Urine: NEGATIVE ng/mL
Opiate Quant, Ur: NEGATIVE ng/mL
Phencyclidine, Ur: NEGATIVE ng/mL
Propoxyphene, Urine: NEGATIVE ng/mL

## 2018-07-29 LAB — PROLACTIN: Prolactin: 22.6 ng/mL (ref 4.8–23.3)

## 2018-08-25 DIAGNOSIS — F319 Bipolar disorder, unspecified: Secondary | ICD-10-CM | POA: Diagnosis present

## 2018-09-19 ENCOUNTER — Emergency Department (HOSPITAL_COMMUNITY): Payer: Medicaid Other

## 2018-09-19 ENCOUNTER — Other Ambulatory Visit: Payer: Self-pay

## 2018-09-19 ENCOUNTER — Emergency Department (HOSPITAL_COMMUNITY)
Admission: EM | Admit: 2018-09-19 | Discharge: 2018-09-19 | Disposition: A | Discharge status: Home / Self Care | Payer: Medicaid Other | Attending: Emergency Medicine | Admitting: Emergency Medicine

## 2018-09-19 DIAGNOSIS — M25539 Pain in unspecified wrist: Secondary | ICD-10-CM | POA: Insufficient documentation

## 2018-09-19 DIAGNOSIS — M79605 Pain in left leg: Secondary | ICD-10-CM | POA: Diagnosis not present

## 2018-09-19 DIAGNOSIS — M791 Myalgia, unspecified site: Secondary | ICD-10-CM | POA: Diagnosis not present

## 2018-09-19 DIAGNOSIS — W010XXA Fall on same level from slipping, tripping and stumbling without subsequent striking against object, initial encounter: Secondary | ICD-10-CM | POA: Insufficient documentation

## 2018-09-19 DIAGNOSIS — Y9389 Activity, other specified: Secondary | ICD-10-CM | POA: Diagnosis not present

## 2018-09-19 DIAGNOSIS — Y92007 Garden or yard of unspecified non-institutional (private) residence as the place of occurrence of the external cause: Secondary | ICD-10-CM | POA: Diagnosis not present

## 2018-09-19 DIAGNOSIS — W19XXXA Unspecified fall, initial encounter: Secondary | ICD-10-CM

## 2018-09-19 DIAGNOSIS — R079 Chest pain, unspecified: Secondary | ICD-10-CM | POA: Insufficient documentation

## 2018-09-19 DIAGNOSIS — Y990 Civilian activity done for income or pay: Secondary | ICD-10-CM | POA: Insufficient documentation

## 2018-09-19 DIAGNOSIS — Z79899 Other long term (current) drug therapy: Secondary | ICD-10-CM | POA: Diagnosis not present

## 2018-09-19 DIAGNOSIS — R42 Dizziness and giddiness: Secondary | ICD-10-CM | POA: Diagnosis present

## 2018-09-19 DIAGNOSIS — M79604 Pain in right leg: Secondary | ICD-10-CM | POA: Insufficient documentation

## 2018-09-19 LAB — POC URINE PREG, ED: Preg Test, Ur: NEGATIVE

## 2018-09-19 LAB — PREGNANCY, URINE: Preg Test, Ur: NEGATIVE

## 2018-09-19 MED ORDER — ACETAMINOPHEN 500 MG PO TABS: 1000.0000 mg | ORAL_TABLET | Freq: Three times a day (TID) | ORAL | 0 refills | Status: AC | PRN

## 2018-09-19 MED ORDER — ACETAMINOPHEN 325 MG PO TABS
650.0000 mg | ORAL_TABLET | Freq: Once | ORAL | Status: AC
  Administered 2018-09-19: 650 mg via ORAL
  Filled 2018-09-19: qty 2

## 2018-09-19 NOTE — ED Provider Notes (Signed)
[facility] Provider Note   CSN: [id number] Arrival date & time: 09/19/18  1645    History   Chief Complaint Chief Complaint  Patient presents with  . Leg Pain    HPI Tamara [name] is a 23 y.o. female with history of bipolar disorder, DID, autism, seizures, PTSD who presents following fall.  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  She reports hitting her head and blacking out.  She is pretty sure she blacked out after she hit her head.  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  She has had some dizziness since the fall.  She reports pain all over, but mostly in her legs.     HPI  Past Medical History:  Diagnosis Date  . ADHD   . Autism   . Bipolar disorder (HCC)   . Chronic back pain    "mostly lower; sometimes all over" (03/22/2017)  . Chronic stomach ulcer   . Depression   . GERD (gastroesophageal reflux disease)   . Headache    "a few/week" (03/22/2017)  . Migraine    "monthly recently" (03/22/2017)  . Personality disorder (HCC)   . PTSD (post-traumatic stress disorder)   . Seizures (HCC)    "very random; I lose memory of what happens; usually happens when I'[name] in bed; probably 2-3/year" (03/22/2017)    Patient Active Problem List   Diagnosis Date Noted  . Bipolar 1 disorder (HCC) 08/25/2018  . MDD (major depressive disorder), single episode, severe with psychotic features (HCC) 07/27/2018  . Bipolar affective disorder, depressed, moderate (HCC) 07/09/2018  . Borderline personality disorder (HCC) 12/20/2016    No past surgical history on file.   OB History   No obstetric history on file.      Home Medications    Prior to Admission medications   Medication Sig Start Date End Date Taking? Authorizing Provider  acetaminophen (TYLENOL) 500 MG tablet Take 2 tablets (1,000 mg total) by mouth every 8 (eight) hours as  needed for moderate pain. 09/19/18   Tamara [name], Tamara BogaAlexandra M, PA-C  atorvastatin (LIPITOR) 20 MG tablet Take 20 mg by mouth at bedtime.     [provider]  FLUoxetine (PROZAC) 20 MG capsule Take 1 capsule (20 mg total) by mouth daily. For mood/anxiety 07/15/18   Tamara [name]  hydrOXYzine (ATARAX/VISTARIL) 25 MG tablet Take 1 tablet (25 mg total) by mouth at bedtime. For anxiety 07/14/18   Tamara [name]  Multiple Vitamin (MULTIVITAMIN WITH MINERALS) TABS tablet Take 1 tablet by mouth daily.    [provider]  pantoprazole (PROTONIX) 40 MG tablet Take 1 tablet (40 mg total) by mouth daily. For reflux 07/15/18   Tamara [name]  prazosin (MINIPRESS) 5 MG capsule Take 5 mg by mouth at bedtime as needed. 07/06/18   [provider]  QUEtiapine (SEROQUEL) 200 MG tablet Take 1 tablet (200 mg total) by mouth at bedtime. For mood 07/14/18   Tamara [name]  traZODone (DESYREL) 100 MG tablet Take 1 tablet (100 mg total) by mouth at bedtime as needed for sleep. 07/14/18   Tamara [name]  Vitamin D, Ergocalciferol, (DRISDOL) 1.25 MG (50000 UT) CAPS capsule Take 1 capsule by mouth once a week. 07/06/18   [provider]    Family History Family History  Problem Relation Age of Onset  .  Diabetes Mother   . Hypertension Maternal Grandmother   . Hyperlipidemia Maternal Grandmother   . Diabetes Maternal Grandmother     Social History Social History   Tobacco Use  . Smoking status: Never Smoker  . Smokeless tobacco: Never Used  Substance Use Topics  . Alcohol use: No    Alcohol/week: 0.0 standard drinks  . Drug use: No     Allergies   Asa [aspirin], Pork-derived products, and Lithium   Review of Systems Review of Systems  Constitutional: Negative for chills and fever.  HENT: Negative for facial swelling and sore throat.   Respiratory: Negative for shortness of breath.   Cardiovascular: Negative for chest pain.  Gastrointestinal: Negative for abdominal  pain, nausea and vomiting.  Genitourinary: Negative for dysuria.  Musculoskeletal: Positive for back pain and myalgias.  Skin: Negative for rash and wound.  Neurological: Positive for dizziness and syncope. Negative for headaches.  Psychiatric/Behavioral: The patient is not nervous/anxious.      Physical Exam Updated Vital Signs BP 140/90   Pulse 93   Temp 98.3 F (36.8 C) (Oral)   Resp 18   SpO2 100%   Physical Exam Vitals signs and nursing note reviewed.  Constitutional:      General: She is not in acute distress.    Appearance: She is well-developed. She is not diaphoretic.  HENT:     Head: Normocephalic and atraumatic.     Mouth/Throat:     Pharynx: No oropharyngeal exudate.  Eyes:     General: No scleral icterus.       Right eye: No discharge.        Left eye: No discharge.     Extraocular Movements: Extraocular movements intact.     Conjunctiva/sclera: Conjunctivae normal.     Pupils: Pupils are equal, round, and reactive to light.  Neck:     Musculoskeletal: Normal range of motion and neck supple.     Thyroid: No thyromegaly.  Cardiovascular:     Rate and Rhythm: Normal rate and regular rhythm.     Heart sounds: Normal heart sounds. No murmur. No friction rub. No gallop.   Pulmonary:     Effort: Pulmonary effort is normal. No respiratory distress.     Breath sounds: Normal breath sounds. No stridor. No wheezing or rales.  Chest:     Chest wall: Tenderness present.  Abdominal:     General: Bowel sounds are normal. There is no distension.     Palpations: Abdomen is soft.     Tenderness: There is no abdominal tenderness. There is no guarding or rebound.  Musculoskeletal:     Comments: Patient reporting pain virtually everywhere I touch, however no deformities noted; on distracted exam, no midline cervical, thoracic tenderness; some tenderness on the lumbar spine On distracted exam, patient does have some tenderness on bilateral wrists and knees, as well as  chest.  Lymphadenopathy:     Cervical: No cervical adenopathy.  Skin:    General: Skin is warm and dry.     Coloration: Skin is not pale.     Findings: No rash.  Neurological:     Mental Status: She is alert.     Coordination: Coordination normal.     Comments: CN 3-12 intact; normal sensation throughout; 5/5 strength in all 4 extremities; equal bilateral grip strength  Psychiatric:     Comments: Abnormal affect      ED Treatments / Results  Labs (all labs ordered are listed, but only abnormal results are  displayed) Labs Reviewed  PREGNANCY, URINE  POC URINE PREG, ED    EKG None  Radiology Dg Chest 2 View  Result Date: 09/19/2018 CLINICAL DATA:  23 year old female with syncope and fall. Pain. EXAM: CHEST - 2 VIEW COMPARISON:  Chest radiographs 07/26/2017. FINDINGS: AP and lateral views. Mildly lower lung volumes. Mediastinal contours remain normal. Visualized tracheal air column is within normal limits. Both lungs appear clear. Negative visible bowel gas and osseous structures. IMPRESSION: Negative.  No cardiopulmonary abnormality. Electronically Signed   By: Odessa FlemingH  Hall [name].D.   On: 09/19/2018 19:21   Dg Lumbar Spine Complete  Result Date: 09/19/2018 CLINICAL DATA:  23 year old female with syncope and fall. Pain. EXAM: LUMBAR SPINE - COMPLETE 4+ VIEW COMPARISON:  CT Abdomen and Pelvis 02/04/2017. FINDINGS: Normal lumbar segmentation. Straightening of lumbar lordosis compared to 2018. No spondylolisthesis. Preserved disc spaces. No pars fracture. Visible lower thoracic levels appear intact. Sacral ala and SI joints appear normal. No acute osseous abnormality identified. Negative abdominal and pelvic visceral contours. IMPRESSION: Negative radiographic appearance of the lumbar spine. Electronically Signed   By: Odessa FlemingH  Hall [name].D.   On: 09/19/2018 19:19   Dg Wrist Complete Left  Result Date: 09/19/2018 CLINICAL DATA:  Fall EXAM: LEFT WRIST - COMPLETE 3+ VIEW COMPARISON:  None. FINDINGS:  There is no evidence of fracture or dislocation. There is no evidence of arthropathy or other focal bone abnormality. Soft tissues are unremarkable. IMPRESSION: Negative. Electronically Signed   By: Charlett NoseKevin  Dover [name].D.   On: 09/19/2018 19:12   Dg Wrist Complete Right  Result Date: 09/19/2018 CLINICAL DATA:  Fall EXAM: RIGHT WRIST - COMPLETE 3+ VIEW COMPARISON:  None. FINDINGS: There is no evidence of fracture or dislocation. There is no evidence of arthropathy or other focal bone abnormality. Soft tissues are unremarkable. IMPRESSION: Negative. Electronically Signed   By: Charlett NoseKevin  Dover [name].D.   On: 09/19/2018 19:12   Ct Head Wo Contrast  Result Date: 09/19/2018 CLINICAL DATA:  Fall, head trauma EXAM: CT HEAD WITHOUT CONTRAST TECHNIQUE: Contiguous axial images were obtained from the base of the skull through the vertex without intravenous contrast. COMPARISON:  None. FINDINGS: Brain: No acute intracranial abnormality. Specifically, no hemorrhage, hydrocephalus, mass lesion, acute infarction, or significant intracranial injury. Vascular: No hyperdense vessel or unexpected calcification. Skull: No acute calvarial abnormality. Sinuses/Orbits: Mucosal thickening throughout the paranasal sinuses. No air-fluid levels. Orbital soft tissues unremarkable. Mastoid air cells clear. Other: None IMPRESSION: No acute intracranial abnormality.  Chronic sinusitis. Electronically Signed   By: Charlett NoseKevin  Dover [name].D.   On: 09/19/2018 18:26   Dg Knee Complete 4 Views Left  Result Date: 09/19/2018 CLINICAL DATA:  23 year old female with syncope and fall. Pain. EXAM: LEFT KNEE - COMPLETE 4+ VIEW COMPARISON:  None. FINDINGS: The lateral view is oblique. No joint effusion is evident. Joint spaces and alignment appear normal. Bone mineralization is within normal limits. No osseous abnormality identified. No discrete soft tissue injury. IMPRESSION: Negative. Electronically Signed   By: Odessa FlemingH  Hall [name].D.   On: 09/19/2018 19:20   Dg Knee  Complete 4 Views Right  Result Date: 09/19/2018 CLINICAL DATA:  23 year old female with syncope and fall. Pain. EXAM: RIGHT KNEE - COMPLETE 4+ VIEW COMPARISON:  None. FINDINGS: Bone mineralization is within normal limits. No evidence of fracture, dislocation, or joint effusion. No evidence of arthropathy or other focal bone abnormality. Soft tissues are unremarkable. IMPRESSION: Negative. Electronically Signed   By: Odessa FlemingH  Hall [name].D.   On: 09/19/2018 19:19  Procedures Procedures (including critical care time)  Medications Ordered in ED Medications  acetaminophen (TYLENOL) tablet 650 mg (650 mg Oral Given 09/19/18 1751)     Initial Impression / Assessment and Plan / ED Course  I have reviewed the triage vital signs and the nursing notes.  Pertinent labs & imaging results that were available during my care of the patient were reviewed by me and considered in my medical decision making (see chart for details).        Patient presenting following mechanical fall after tripping on the sidewalk.  X-rays of bilateral wrists, knees, chest, L-spine and CT head are negative.  Patient very abnormal affect and tender pretty much everywhere I touch.  There is no deformity or ecchymosis.  Do not feel patient has any traumatic injury and is sore from the fall.  Patient is ambulatory prior to discharge.  Will discharge home with Tylenol.  Ice and heat discussed.  Follow-up to PCP as needed.  Patient requesting work note for a week, will provide for 5 days.  Return precautions discussed.  Patient understands and agrees with plan.  Patient vital stable throughout ED course and discharged in satisfactory condition.  Final Clinical Impressions(s) / ED Diagnoses   Final diagnoses:  Muscle pain  Fall, initial encounter    ED Discharge Orders         Ordered    acetaminophen (TYLENOL) 500 MG tablet  Every 8 hours PRN     09/19/18 1952           Frederica Kuster, PA-C 09/19/18 1958    Valarie Merino, MD 10/06/18 1029

## 2018-09-19 NOTE — Discharge Instructions (Signed)
Take Tylenol every 8 hours as needed for pain.  Use ice 3-4 times daily alternating 20 minutes on, 20 minutes off.  After the fourth day, you can switch to heating pad on your sore muscles.  Please return to the emergency department if you develop any new or worsening symptoms.

## 2018-09-19 NOTE — ED Triage Notes (Signed)
Pt c/o bilateral leg pain after she tripped on something and fell on to concrete; pt reports " blacking out " after hitting her head on the concrete ; pt reports slight head pain

## 2018-10-04 ENCOUNTER — Emergency Department (HOSPITAL_COMMUNITY)
Admission: EM | Admit: 2018-10-04 | Discharge: 2018-10-05 | Disposition: A | Discharge status: Home / Self Care | Payer: Medicaid Other | Attending: Emergency Medicine | Admitting: Emergency Medicine

## 2018-10-04 ENCOUNTER — Encounter: Payer: Self-pay | Admitting: Neurology

## 2018-10-04 ENCOUNTER — Emergency Department (HOSPITAL_COMMUNITY): Payer: Medicaid Other

## 2018-10-04 ENCOUNTER — Ambulatory Visit: Payer: Medicaid Other | Admitting: Neurology

## 2018-10-04 DIAGNOSIS — Z20828 Contact with and (suspected) exposure to other viral communicable diseases: Secondary | ICD-10-CM | POA: Insufficient documentation

## 2018-10-04 DIAGNOSIS — F319 Bipolar disorder, unspecified: Secondary | ICD-10-CM | POA: Diagnosis not present

## 2018-10-04 DIAGNOSIS — F603 Borderline personality disorder: Secondary | ICD-10-CM | POA: Insufficient documentation

## 2018-10-04 DIAGNOSIS — Z79899 Other long term (current) drug therapy: Secondary | ICD-10-CM | POA: Diagnosis not present

## 2018-10-04 DIAGNOSIS — R4182 Altered mental status, unspecified: Secondary | ICD-10-CM | POA: Diagnosis present

## 2018-10-04 LAB — CBC WITH DIFFERENTIAL/PLATELET
Abs Immature Granulocytes: 0.06 10*3/uL (ref 0.00–0.07)
Basophils Absolute: 0.1 10*3/uL (ref 0.0–0.1)
Basophils Relative: 0 %
Eosinophils Absolute: 0.4 10*3/uL (ref 0.0–0.5)
Eosinophils Relative: 3 %
HCT: 40 % (ref 36.0–46.0)
Hemoglobin: 12.7 g/dL (ref 12.0–15.0)
Immature Granulocytes: 0 %
Lymphocytes Relative: 20 %
Lymphs Abs: 3.1 10*3/uL (ref 0.7–4.0)
MCH: 27.7 pg (ref 26.0–34.0)
MCHC: 31.8 g/dL (ref 30.0–36.0)
MCV: 87.3 fL (ref 80.0–100.0)
Monocytes Absolute: 0.8 10*3/uL (ref 0.1–1.0)
Monocytes Relative: 5 %
Neutro Abs: 11.1 10*3/uL — ABNORMAL HIGH (ref 1.7–7.7)
Neutrophils Relative %: 72 %
Platelets: 404 10*3/uL — ABNORMAL HIGH (ref 150–400)
RBC: 4.58 MIL/uL (ref 3.87–5.11)
RDW: 13.2 % (ref 11.5–15.5)
WBC: 15.5 10*3/uL — ABNORMAL HIGH (ref 4.0–10.5)
nRBC: 0 % (ref 0.0–0.2)

## 2018-10-04 LAB — COMPREHENSIVE METABOLIC PANEL
ALT: 26 U/L (ref 0–44)
AST: 21 U/L (ref 15–41)
Albumin: 3.5 g/dL (ref 3.5–5.0)
Alkaline Phosphatase: 94 U/L (ref 38–126)
Anion gap: 10 (ref 5–15)
BUN: 6 mg/dL (ref 6–20)
CO2: 21 mmol/L — ABNORMAL LOW (ref 22–32)
Calcium: 9.2 mg/dL (ref 8.9–10.3)
Chloride: 107 mmol/L (ref 98–111)
Creatinine, Ser: 0.71 mg/dL (ref 0.44–1.00)
GFR calc Af Amer: >60 mL/min (ref >60)
GFR calc non Af Amer: >60 mL/min (ref >60)
Glucose, Bld: 119 mg/dL — ABNORMAL HIGH (ref 70–99)
Potassium: 3.9 mmol/L (ref 3.5–5.1)
Sodium: 138 mmol/L (ref 135–145)
Total Bilirubin: 0.3 mg/dL (ref 0.3–1.2)
Total Protein: 7.2 g/dL (ref 6.5–8.1)

## 2018-10-04 LAB — ETHANOL: Alcohol, Ethyl (B): <10 mg/dL (ref <10)

## 2018-10-04 LAB — URINALYSIS, ROUTINE W REFLEX MICROSCOPIC
Bilirubin Urine: NEGATIVE
Glucose, UA: NEGATIVE mg/dL
Hgb urine dipstick: NEGATIVE
Ketones, ur: NEGATIVE mg/dL
Leukocytes,Ua: NEGATIVE
Nitrite: NEGATIVE
Protein, ur: NEGATIVE mg/dL
Specific Gravity, Urine: 1.02 (ref 1.005–1.030)
pH: 5 (ref 5.0–8.0)

## 2018-10-04 LAB — PREGNANCY, URINE: Preg Test, Ur: NEGATIVE

## 2018-10-04 MED ORDER — TRAZODONE HCL 100 MG PO TABS: 100.0000 mg | ORAL_TABLET | Freq: Every evening | ORAL | Status: DC | PRN

## 2018-10-04 MED ORDER — HYDROXYZINE HCL 25 MG PO TABS
25.0000 mg | ORAL_TABLET | Freq: Every day | ORAL | Status: DC
  Administered 2018-10-05: 25 mg via ORAL
  Filled 2018-10-04: qty 1

## 2018-10-04 MED ORDER — QUETIAPINE FUMARATE 200 MG PO TABS
200.0000 mg | ORAL_TABLET | Freq: Every day | ORAL | Status: DC
  Administered 2018-10-05: 01:00:00 200 mg via ORAL
  Filled 2018-10-04: qty 1

## 2018-10-04 MED ORDER — PANTOPRAZOLE SODIUM 40 MG PO TBEC
40.0000 mg | DELAYED_RELEASE_TABLET | Freq: Every day | ORAL | Status: DC
  Administered 2018-10-05: 09:00:00 40 mg via ORAL
  Filled 2018-10-04: qty 1

## 2018-10-04 MED ORDER — FLUOXETINE HCL 20 MG PO CAPS
20.0000 mg | ORAL_CAPSULE | Freq: Every day | ORAL | Status: DC
  Administered 2018-10-05 (×2): 20 mg via ORAL
  Filled 2018-10-04 (×2): qty 1

## 2018-10-04 MED ORDER — ATORVASTATIN CALCIUM 10 MG PO TABS
20.0000 mg | ORAL_TABLET | Freq: Every day | ORAL | Status: DC
  Administered 2018-10-05: 20 mg via ORAL
  Filled 2018-10-04: qty 2

## 2018-10-04 NOTE — ED Triage Notes (Signed)
Pt BIB EMS from home with c/o confusion. EMS reports AOx2 upon arrival at patient home but then.Marland KitchenMarland KitchenAOx1 just prior to arrival. Last well Known is unknown. Passed stroke screen. Pt reports taking something as prescribed. Eyes PERRLA.  Pt received 100 Nacl PTA because BP was a little soft.

## 2018-10-04 NOTE — ED Provider Notes (Signed)
[facility]   CSN: [id number] Arrival date & time: 10/04/18  1946    History   Chief Complaint No chief complaint on file.  Level 5 caveat due to altered mental status/psychiatric disorder. HPI Tamara [name] is a 23 y.o. female.     HPI Patient reportedly brought in for mental status changes.  Patient cannot tell me her name.  Asked to tell me what the date is she looks at the date written on the wall and says that cannot be right and thinks it is 2019.  States she does not know anything about a covert infection.  Denies headache.  Is able to tell me she is at the hospital.  History of psychiatric disorders.  Denies chest or abdominal pain.  Denies substance abuse.  States she has an allergy to lithium.  When I walked in the room patient was watching TV and adjusting the channels using the remote control. Past Medical History:  Diagnosis Date  . ADHD   . Autism   . Bipolar disorder (HCC)   . Chronic back pain    "mostly lower; sometimes all over" (03/22/2017)  . Chronic stomach ulcer   . Depression   . GERD (gastroesophageal reflux disease)   . Headache    "a few/week" (03/22/2017)  . Migraine    "monthly recently" (03/22/2017)  . Personality disorder (HCC)   . PTSD (post-traumatic stress disorder)   . Seizures (HCC)    "very random; I lose memory of what happens; usually happens when I'm in bed; probably 2-3/year" (03/22/2017)    Patient Active Problem List   Diagnosis Date Noted  . Bipolar 1 disorder (HCC) 08/25/2018  . MDD (major depressive disorder), single episode, severe with psychotic features (HCC) 07/27/2018  . Bipolar affective disorder, depressed, moderate (HCC) 07/09/2018  . Borderline personality disorder (HCC) 12/20/2016    No past surgical history on file.   OB History   No obstetric history on file.      Home Medications    Prior to Admission medications   Medication Sig Start Date End  Date Taking? Authorizing Provider  acetaminophen (TYLENOL) 500 MG tablet Take 2 tablets (1,000 mg total) by mouth every 8 (eight) hours as needed for moderate pain. 09/19/18   Law, Waylan BogaAlexandra M, PA-C  atorvastatin (LIPITOR) 20 MG tablet Take 20 mg by mouth at bedtime.     [provider]  FLUoxetine (PROZAC) 20 MG capsule Take 1 capsule (20 mg total) by mouth daily. For mood/anxiety 07/15/18   Aldean BakerSykes, Janet E, NP  hydrOXYzine (ATARAX/VISTARIL) 25 MG tablet Take 1 tablet (25 mg total) by mouth at bedtime. For anxiety 07/14/18   Aldean BakerSykes, Janet E, NP  Multiple Vitamin (MULTIVITAMIN WITH MINERALS) TABS tablet Take 1 tablet by mouth daily.    [provider]  pantoprazole (PROTONIX) 40 MG tablet Take 1 tablet (40 mg total) by mouth daily. For reflux 07/15/18   Aldean BakerSykes, Janet E, NP  prazosin (MINIPRESS) 5 MG capsule Take 5 mg by mouth at bedtime as needed. 07/06/18   [provider]  QUEtiapine (SEROQUEL) 200 MG tablet Take 1 tablet (200 mg total) by mouth at bedtime. For mood 07/14/18   Aldean BakerSykes, Janet E, NP  traZODone (DESYREL) 100 MG tablet Take 1 tablet (100 mg total) by mouth at bedtime as needed for sleep. 07/14/18   Aldean BakerSykes, Janet E, NP  Vitamin D, Ergocalciferol, (DRISDOL) 1.25 MG (50000 UT) CAPS capsule Take 1 capsule by mouth  once a week. 07/06/18   [provider]    Family History Family History  Problem Relation Age of Onset  . Diabetes Mother   . Hypertension Maternal Grandmother   . Hyperlipidemia Maternal Grandmother   . Diabetes Maternal Grandmother     Social History Social History   Tobacco Use  . Smoking status: Never Smoker  . Smokeless tobacco: Never Used  Substance Use Topics  . Alcohol use: No    Alcohol/week: 0.0 standard drinks  . Drug use: No     Allergies   Asa [aspirin], Pork-derived products, and Lithium   Review of Systems Review of Systems  Unable to perform ROS: Mental status change     Physical Exam Updated Vital Signs BP 120/83    Pulse 80   Temp 97.8 F (36.6 C)   Resp (!) 26   Ht 5\' 4"  (1.626 m)   Wt 117 kg   SpO2 99%   BMI 44.27 kg/m   Physical Exam Vitals signs and nursing note reviewed.  Constitutional:      Appearance: Normal appearance.  HENT:     Head: Atraumatic.  Eyes:     Extraocular Movements: Extraocular movements intact.  Neck:     Musculoskeletal: Neck supple.  Cardiovascular:     Rate and Rhythm: Normal rate and regular rhythm.  Pulmonary:     Effort: Pulmonary effort is normal.  Abdominal:     Palpations: Abdomen is soft.  Musculoskeletal:     Right lower leg: No edema.     Left lower leg: No edema.  Skin:    General: Skin is warm.     Capillary Refill: Capillary refill takes less than 2 seconds.  Neurological:     Mental Status: She is alert.     Comments: Patient is awake.  Not able to tell me her name but is able to tell me she is in the hospital.  States she thinks it is the year 2019.  States she does not know anything about a COVID infection.      ED Treatments / Results  Labs (all labs ordered are listed, but only abnormal results are displayed) Labs Reviewed  COMPREHENSIVE METABOLIC PANEL - Abnormal; Notable for the following components:      Result Value   CO2 21 (*)    Glucose, Bld 119 (*)    All other components within normal limits  CBC WITH DIFFERENTIAL/PLATELET - Abnormal; Notable for the following components:   WBC 15.5 (*)    Platelets 404 (*)    Neutro Abs 11.1 (*)    All other components within normal limits  SARS CORONAVIRUS 2 (HOSPITAL ORDER, PERFORMED IN [facility])  ETHANOL  RAPID URINE DRUG SCREEN, HOSP PERFORMED  PREGNANCY, URINE  URINALYSIS, ROUTINE W REFLEX MICROSCOPIC    EKG None ED ECG REPORT   Date: 10/04/2018  Rate: 81  Rhythm: normal sinus rhythm  QRS Axis: normal  Intervals: normal  ST/T Wave abnormalities: normal  Conduction Disutrbances:none  low QRS voltage    Radiology Ct Head Wo Contrast  Result  Date: 10/04/2018 CLINICAL DATA:  Altered LOC EXAM: CT HEAD WITHOUT CONTRAST TECHNIQUE: Contiguous axial images were obtained from the base of the skull through the vertex without intravenous contrast. COMPARISON:  CT brain 09/19/2018 FINDINGS: Brain: No acute territorial infarction, hemorrhage or intracranial mass. Ventricles are nonenlarged. Vascular: No hyperdense vessel or unexpected calcification. Skull: Normal. Negative for fracture or focal lesion. Sinuses/Orbits: Mucosal thickening in the maxillary  ethmoid and frontal sinuses Other: None IMPRESSION: Negative non contrasted CT appearance of the brain Electronically Signed   By: Donavan Foil M.D.   On: 10/04/2018 22:30    Procedures Procedures (including critical care time)  Medications Ordered in ED Medications - No data to display   Initial Impression / Assessment and Plan / ED Course  I have reviewed the triage vital signs and the nursing notes.  Pertinent labs & imaging results that were available during my care of the patient were reviewed by me and considered in my medical decision making (see chart for details).        Patient with mental status changes.  Urinalysis pending but otherwise medically clear.  Head CT done due to recent head trauma.  I think the mental status changes are likely due to psychiatric cause.  Care will be turned over to Dr. Christy Gentles.   Final Clinical Impressions(s) / ED Diagnoses   Final diagnoses:  Altered mental status, unspecified altered mental status type    ED Discharge Orders    None       Davonna Belling, MD 10/04/18 2245

## 2018-10-04 NOTE — BH Assessment (Signed)
Clinician attempted to contact pt on the Tele-Assessment machine. Pt's nurse stated pt was in the process of changing rooms; RN requested clinician call back in 10 minutes after pt was done moving rooms to complete the assessment. Clinician called back at [phone]; there was no answer. Clinician will call back at a later time

## 2018-10-05 DIAGNOSIS — F603 Borderline personality disorder: Secondary | ICD-10-CM

## 2018-10-05 LAB — RAPID URINE DRUG SCREEN, HOSP PERFORMED
Amphetamines: NOT DETECTED
Barbiturates: NOT DETECTED
Benzodiazepines: NOT DETECTED
Cocaine: NOT DETECTED
Opiates: NOT DETECTED
Tetrahydrocannabinol: NOT DETECTED

## 2018-10-05 LAB — SARS CORONAVIRUS 2 BY RT PCR (HOSPITAL ORDER, PERFORMED IN ~~LOC~~ HOSPITAL LAB): SARS Coronavirus 2: NEGATIVE

## 2018-10-05 NOTE — ED Notes (Signed)
Patient verbalizes understanding of discharge instructions. Opportunity for questioning and answers were provided. Armband removed by staff, pt discharged from ED.  

## 2018-10-05 NOTE — ED Provider Notes (Signed)
Care assumed from Dr. Alvino Chapel  Please see his full H&P.  In short,  Tamara [name] is a 23 y.o. female presents for altered mental status.  Work-up so far has been unremarkable.  Head CT is negative.  Patient evaluated by TTS and per social work note there is no evidence of imminent risk to self or others and patient is psychiatrically cleared. Pt is stable and safe to be discharged home.   Vitals:   10/04/18 2145 10/04/18 2305  BP: 120/83 120/69  Pulse: 80 76  Resp: (!) 26 20  Temp:    SpO2: 99% 99%    PE: Constitutional: well-developed, well-nourished, no apparent distress HENT: normocephalic, atraumatic. no cervical adenopathy Cardiovascular: normal rate and rhythm, distal pulses intact Pulmonary/Chest: effort normal; breath sounds clear and equal bilaterally; no wheezes or rales Abdominal: soft and nontender Musculoskeletal: full ROM, no edema Neurological: alert with goal directed thinking Skin: warm and dry, no rash, no diaphoresis Psychiatric: normal mood and affect, normal behavior     This note was prepared using Dragon voice recognition software and may include unintentional dictation errors due to the inherent limitations of voice recognition software.     Cherre Robins, PA-C 10/05/18 Bushnell, Ankit, MD 10/06/18 1431

## 2018-10-05 NOTE — ED Notes (Signed)
Breakfast ordered 

## 2018-10-05 NOTE — ED Notes (Signed)
Lunch tray ordered 

## 2018-10-05 NOTE — BH Assessment (Addendum)
Tele Assessment Note   Patient Name: Tamara [name] MRN: [id number] Referring Physician: Dr. Benjiman CoreNathan Pickering, MD [facility] Location of Provider: Behavioral [facility] TTS Department  Corlis HoveQueenie Rhea Williams [name] is a 23 y.o. female who [facility] due to confusion. Pt was AOx2 upon EMS arrival to pt home and then AOx1 just prior to arrival. Pt passed stroke screen. Head CT was done due to recent head trauma. Clinician introduces herself to pt. Pt is unable to provide clinician her name or the date, though she does identify she is at the hospital. Pt shares she has never been married and that she lives in student housing, though she is unable to identify whether she has roommates or not. Pt denies SI, HI, access to guns/weapons, and current or past SA. Pt states she has been experiencing AVH but is unable to give specifics. 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 Clinician inquired about pt's sleep and pt stated she has been more tired recently and that she has been sleeping around 15 hours per day. She stated she has been eating 1-2 meals per day, which is her eating when she's hungry. Pt stated she believes she sees someone for therapy, a woman named Tamara [name], though she couldn't specifically remember.  Pt is known to clinician, as clinician [facility]. At that time, pt came into the hospital demonstrating multiple personalities, telling clinician that she was "tired," and that "I need to let my host take over." At that time, pt closed her eyes, put her head down, and a moment later raised her head, opened her eyes, and said, "where am I?" Clinician continued with the assessment without feeding into pt's delusion and did as well today by not feeding into pt's argument that  court "just happened" when she said she had court on September 07, 2017 or when clinician said goodbye and said pt's name and pt began to question her own name.  Pt is currently oriented x2, though it is this clinician's belief that pt is feigning this. Pt's recent and remote memory is more intact when pt is caught off-guard answering questions; when she has time to think, her memory is poor. Pt was, overall, cooperative throughout the assessment , though her openness and honestly regarding her symptoms could be of detriment to her. Pt's insight, judgement, and impulse control is poor at this time.   Diagnosis: F60.3, Borderline personality disorder   Past Medical History:  Past Medical History:  Diagnosis Date  . ADHD   . Autism   . Bipolar disorder (HCC)   . Chronic back pain    "mostly lower; sometimes all over" (03/22/2017)  . Chronic stomach ulcer   . Depression   . GERD (gastroesophageal reflux disease)   . Headache    "a few/week" (03/22/2017)  . Migraine    "monthly recently" (03/22/2017)  . Personality disorder (HCC)   . PTSD (post-traumatic stress disorder)   . Seizures (HCC)    "very random; I lose memory of what happens; usually happens when I'm in bed; probably 2-3/year" (03/22/2017)    No past surgical history on file.  Family History:  Family History  Problem Relation Age of Onset  . Diabetes Mother   . Hypertension Maternal  Grandmother   . Hyperlipidemia Maternal Grandmother   . Diabetes Maternal Grandmother     Social History:  reports that she has never smoked. She has never used smokeless tobacco. She reports that she does not drink alcohol or use drugs.  Additional Social History:  Alcohol / Drug Use Pain Medications: Please see MAR Prescriptions: Please see MAR Over the Counter: Please see MAR History of alcohol / drug use?: No history of alcohol / drug abuse Longest period of sobriety (when/how long): Pt denies SA  CIWA: CIWA-Ar BP: 120/69 Pulse Rate:  76 COWS:    Allergies:  Allergies  Allergen Reactions  . Asa [Aspirin] Anaphylaxis  . Pork-Derived Products Shortness Of Breath  . Lithium Hives    Home Medications: (Not in a hospital admission)   OB/GYN Status:  No LMP recorded. Patient has had an implant.  General Assessment Data Assessment unable to be completed: Yes Reason for not completing assessment: Pt was in the process of changing rooms; RN requested clinician call back in 10 minutes after pt was done moving rooms. Clinician called back at [phone]; there was no answer. Location of Assessment: [facility] ED TTS Assessment: In system Is this a Tele or Face-to-Face Assessment?: Tele Assessment Is this an Initial Assessment or a Re-assessment for this encounter?: Initial Assessment Patient Accompanied by:: N/A Language Other than English: No Living Arrangements: Other (Comment)(Pt lives in student housing) What gender do you identify as?: Female Marital status: Single Maiden name: Groner Pregnancy Status: No Living Arrangements: Non-relatives/Friends Can pt return to current living arrangement?: Yes Admission Status: Voluntary Is patient capable of signing voluntary admission?: Yes Referral Source: Self/Family/Friend Insurance type: Medicaid     Crisis Care Plan Living Arrangements: Non-relatives/Friends Legal Guardian: Other:(Self) Name of Psychiatrist: N/A / Unknown Name of Therapist: N/A / Unknown  Education Status Is patient currently in school?: Yes Current Grade: Unknown Highest grade of school patient has completed: Unknown Name of school: Unknown Contact person: Tamara [name] IEP information if applicable: N/A  Risk to self with the past 6 months Suicidal Ideation: No Has patient been a risk to self within the past 6 months prior to admission? : No Suicidal Intent: No Has patient had any suicidal intent within the past 6 months prior to admission? : No Is patient at risk for suicide?: No Suicidal  Plan?: No Has patient had any suicidal plan within the past 6 months prior to admission? : No Access to Means: No What has been your use of drugs/alcohol within the last 12 months?: Pt denies SA Previous Attempts/Gestures: No How many times?: 0 Other Self Harm Risks: Pt shares she is currently having memory issues Triggers for Past Attempts: None known Intentional Self Injurious Behavior: Cutting Comment - Self Injurious Behavior: Pt previously engaged in NSSIB via cutting in HS Family Suicide History: Unable to assess Recent stressful life event(s): Other (Comment)(Pt shares she is currently having memory issues) Persecutory voices/beliefs?: No Depression: No Depression Symptoms: Fatigue Substance abuse history and/or treatment for substance abuse?: No Suicide prevention information given to non-admitted patients: Not applicable  Risk to Others within the past 6 months Homicidal Ideation: No Does patient have any lifetime risk of violence toward others beyond the six months prior to admission? : No Thoughts of Harm to Others: No Current Homicidal Intent: No Current Homicidal Plan: No Access to Homicidal Means: No Identified Victim: None noted History of harm to others?: No Assessment of Violence: On admission Violent Behavior Description: None noted Does patient have access  to weapons?: No(Pt denies access to guns/weapons) Criminal Charges Pending?: No Does patient have a court date: No Is patient on probation?: No  Psychosis Hallucinations: (States believes she is/has experienced AVH but isn't sure)  Mental Status Report Appearance/Hygiene: Unremarkable Eye Contact: Good Motor Activity: Unremarkable Speech: Soft, Slow Level of Consciousness: Alert Mood: Anxious Affect: Anxious, Other (Comment)(Confused with some questions but knowledgeable with others) Anxiety Level: Minimal Thought Processes: Circumstantial(Confused with some questions but knowledgeable with  others) Judgement: Impaired Orientation: Place, Situation, Other (Comment)(Confused w/ some questions but knowledgeable w/ others) Obsessive Compulsive Thoughts/Behaviors: None  Cognitive Functioning Concentration: Normal Memory: (Confused w/ some general ?s (name) but knew specific others) Is patient IDD: No Insight: Poor Impulse Control: Poor Appetite: Good Have you had any weight changes? : No Change Sleep: Increased Total Hours of Sleep: 15(Pt states her sleep has increased to approx 15 hours/day) Vegetative Symptoms: Staying in bed  ADLScreening [facility] Patient's cognitive ability adequate to safely complete daily activities?: Yes Patient able to express need for assistance with ADLs?: Yes Independently performs ADLs?: Yes (appropriate for developmental age)  Prior Inpatient Therapy Prior Inpatient Therapy: (UTA)  Prior Outpatient Therapy Prior Outpatient Therapy: (UTA) Does patient have an ACCT team?: No Does patient have Intensive In-House Services?  : No Does patient have [facility] services? : No Does patient have P4CC services?: No  ADL Screening (condition at time of admission) Patient's cognitive ability adequate to safely complete daily activities?: Yes Is the patient deaf or have difficulty hearing?: No Does the patient have difficulty seeing, even when wearing glasses/contacts?: No Does the patient have difficulty concentrating, remembering, or making decisions?: No Patient able to express need for assistance with ADLs?: Yes Does the patient have difficulty dressing or bathing?: No Independently performs ADLs?: Yes (appropriate for developmental age) Does the patient have difficulty walking or climbing stairs?: No Weakness of Legs: None Weakness of Arms/Hands: None  Home Assistive Devices/Equipment Home Assistive Devices/Equipment: None  Therapy Consults (therapy consults require a physician order) PT Evaluation Needed: No OT Evalulation  Needed: No SLP Evaluation Needed: No Abuse/Neglect Assessment (Assessment to be complete while patient is alone) Abuse/Neglect Assessment Can Be Completed: Unable to assess, patient is non-responsive or altered mental status Values / Beliefs Cultural Requests During Hospitalization: None Spiritual Requests During Hospitalization: None Consults Spiritual Care Consult Needed: No Social Work Consult Needed: No Merchant navy officerAdvance Directives (For Healthcare) Does Patient Have a Medical Advance Directive?: Unable to assess, patient is non-responsive or altered mental status       Disposition: Tamara ConnJason Berry, NP, reviewed pt's chart and information and determined pt should be observed overnight for safety and stability and re-assessed by psychiatry in the morning. This information was provided to the charge nurse, Tamara Mastersandace, RN, at 0209.   Disposition Initial Assessment Completed for this Encounter: Yes Patient referred to: Other (Comment)(Pt will be observed overnight for safety and stability)  This service was provided via telemedicine using a 2-way, interactive audio and video technology.  Names of all persons participating in this telemedicine service and their role in this encounter. Name: Tamara [name] Role: Patient  Name: Tamara ConnJason [name] Role: Nurse Practitioner  Name: Tamara BradySamantha Iyahna [name] Role: Clinician    Tamara DowdySamantha L Doraine [name] 10/05/2018 12:40 AM

## 2018-10-05 NOTE — Consult Note (Signed)
Telepsych Consultation   Reason for Consult:  Confusion  Referring Physician:  EDP  Location of Patient:  Tamara [name] ed [id number] Location of Provider: [facility]  Patient Identification: Tamara [name] MRN:  [id number] Principal Diagnosis: <principal problem not specified> Diagnosis:  Active Problems:   * No active hospital problems. *   Total Time spent with patient: 15 minutes  Subjective:  " I cant remember why I am here."   HPI:  Tamara [name] is a 23 y.o. female who [facility] due to confusion. Pt was AOx2 upon EMS arrival to pt home and then AOx1 just prior to arrival. Pt passed stroke screen. Head CT was done due to recent head trauma. Clinician introduces herself to pt. Pt is unable to provide clinician her name or the date, though she does identify she is at the hospital. Pt shares she has never been married and that she lives in student housing, though she is unable to identify whether she has roommates or not. Pt denies SI, HI, access to guns/weapons, and current or past SA. Pt states she has been experiencing AVH but is unable to give specifics. 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 Clinician inquired about pt's sleep and pt stated she has been more tired recently and that she has been sleeping around 15 hours per day. She stated she has been eating 1-2 meals per day, which is her eating when she's hungry. Pt stated she believes she sees someone for therapy, a woman named Tamara [name], though she couldn't specifically remember.  Pt is known to clinician, as clinician completed an assessment on pt [facility]. At that time, pt came into the hospital demonstrating multiple personalities, telling clinician that she was "tired," and that "I need to let my host take over." At that time, pt  closed her eyes, put her head down, and a moment later raised her head, opened her eyes, and said, "where am I?" Clinician continued with the assessment without feeding into pt's delusion and did as well today by not feeding into pt's argument that court "just happened" when she said she had court on September 07, 2017 or when clinician said goodbye and said pt's name and pt began to question her own name.  Pt is currently oriented x2, though it is this clinician's belief that pt is feigning this. Pt's recent and remote memory is more intact when pt is caught off-guard answering questions; when she has time to think, her memory is poor. Pt was, overall, cooperative throughout the assessment , though her openness and honestly regarding her symptoms could be of detriment to her. Pt's insight, judgement, and impulse control is poor at this time.   Psychiatric consultation: This is a 23 y.o. female who [facility] due to confusion. During this evaluation, she is alert and oriented x3, calm and cooperative. When asked her name and date of birth she looked at her identify wrist band and stated, " I guess my name is Tamara [name]." She did the same when asked her date of birth. When asked about the president she stated," Tamara [name]." When asked if she knew where she was she replied, " the hospital."  She preceded to say she has a hard time remembering things although  later stated she was brought in the food which she was eating by staff and that she had a good nights sleep. She denies any psychosis, suicidal or homicidal thoughts and in no way does she appear internally preoccupied. She has had multiple presentations and hospitalizations to [facility] either noting confusion, delusions or poor memory. Per chart review, patient has a history of Borderline personality and Bipolar disorder. As with previous clinician observation, it seems as though she is feigning this. At this time, there is no evidence of  imminent risk to self or others at present and she is being psychiatrically cleared.     Past Psychiatric History:  BPAD, PTSD, ADHD, autism, depression, anxiety and BPD.    Risk to Self: Suicidal Ideation: No Suicidal Intent: No Is patient at risk for suicide?: No Suicidal Plan?: No Access to Means: No What has been your use of drugs/alcohol within the last 12 months?: Pt denies SA How many times?: 0 Other Self Harm Risks: Pt shares she is currently having memory issues Triggers for Past Attempts: None known Intentional Self Injurious Behavior: Cutting Comment - Self Injurious Behavior: Pt previously engaged in NSSIB via cutting in HS Risk to Others: Homicidal Ideation: No Thoughts of Harm to Others: No Current Homicidal Intent: No Current Homicidal Plan: No Access to Homicidal Means: No Identified Victim: None noted History of harm to others?: No Assessment of Violence: On admission Violent Behavior Description: None noted Does patient have access to weapons?: No(Pt denies access to guns/weapons) Criminal Charges Pending?: No Does patient have a court date: No Prior Inpatient Therapy: Prior Inpatient Therapy: (UTA) Prior Outpatient Therapy: Prior Outpatient Therapy: (UTA) Does patient have an ACCT team?: No Does patient have Intensive In-House Services?  : No Does patient have [facility] services? : No Does patient have P4CC services?: No  Past Medical History:  Past Medical History:  Diagnosis Date  . ADHD   . Autism   . Bipolar disorder (HCC)   . Chronic back pain    "mostly lower; sometimes all over" (03/22/2017)  . Chronic stomach ulcer   . Depression   . GERD (gastroesophageal reflux disease)   . Headache    "a few/week" (03/22/2017)  . Migraine    "monthly recently" (03/22/2017)  . Personality disorder (HCC)   . PTSD (post-traumatic stress disorder)   . Seizures (HCC)    "very random; I lose memory of what happens; usually happens when I'm in bed; probably  2-3/year" (03/22/2017)   No past surgical history on file. Family History:  Family History  Problem Relation Age of Onset  . Diabetes Mother   . Hypertension Maternal Grandmother   . Hyperlipidemia Maternal Grandmother   . Diabetes Maternal Grandmother    Family Psychiatric  History: Father-alcoholism and drug abuse and mother-BPAD("She hasserial killer tendencies.") Social History:  Social History   Substance and Sexual Activity  Alcohol Use No  . Alcohol/week: 0.0 standard drinks     Social History   Substance and Sexual Activity  Drug Use No    Social History   Socioeconomic History  . Marital status: Single    Spouse name: Not on file  . Number of children: Not on file  . Years of education: Not on file  . Highest education level: Not on file  Occupational History  . Not on file  Social Needs  . Financial resource strain: Not on file  . Food insecurity    Worry: Not on file    Inability: Not on  file  . Transportation needs    Medical: Not on file    Non-medical: Not on file  Tobacco Use  . Smoking status: Never Smoker  . Smokeless tobacco: Never Used  Substance and Sexual Activity  . Alcohol use: No    Alcohol/week: 0.0 standard drinks  . Drug use: No  . Sexual activity: Not Currently  Lifestyle  . Physical activity    Days per week: Not on file    Minutes per session: Not on file  . Stress: Not on file  Relationships  . Social Musicianconnections    Talks on phone: Not on file    Gets together: Not on file    Attends religious service: Not on file    Active member of club or organization: Not on file    Attends meetings of clubs or organizations: Not on file    Relationship status: Not on file  Other Topics Concern  . Not on file  Social History Narrative  . Not on file   Additional Social History:    Allergies:   Allergies  Allergen Reactions  . Asa [Aspirin] Anaphylaxis  . Pork-Derived Products Shortness Of Breath  . Lithium Hives    Labs:   Results for orders placed or performed during the hospital encounter of 10/04/18 (from the past 48 hour(s))  Comprehensive metabolic panel     Status: Abnormal   Collection Time: 10/04/18  8:17 PM  Result Value Ref Range   Sodium 138 135 - 145 mmol/L   Potassium 3.9 3.5 - 5.1 mmol/L   Chloride 107 98 - 111 mmol/L   CO2 21 (L) 22 - 32 mmol/L   Glucose, Bld 119 (H) 70 - 99 mg/dL   BUN 6 6 - 20 mg/dL   Creatinine, Ser 1.610.71 0.44 - 1.00 mg/dL   Calcium 9.2 8.9 - 09.610.3 mg/dL   Total Protein 7.2 6.5 - 8.1 g/dL   Albumin 3.5 3.5 - 5.0 g/dL   AST 21 15 - 41 U/L   ALT 26 0 - 44 U/L   Alkaline Phosphatase 94 38 - 126 U/L   Total Bilirubin 0.3 0.3 - 1.2 mg/dL   GFR calc non Af Amer >60 >60 mL/min   GFR calc Af Amer >60 >60 mL/min   Anion gap 10 5 - 15    Comment: Performed at [facility], [address] [place], [place] [address]  Ethanol     Status: None   Collection Time: 10/04/18  8:17 PM  Result Value Ref Range   Alcohol, Ethyl (B) <10 <10 mg/dL    Comment: (NOTE) Lowest detectable limit for serum alcohol is 10 mg/dL. For medical purposes only. Performed at [facility], [address] [place], [place] [address]   CBC with Differential     Status: Abnormal   Collection Time: 10/04/18  8:17 PM  Result Value Ref Range   WBC 15.5 (H) 4.0 - 10.5 K/uL   RBC 4.58 3.87 - 5.11 MIL/uL   Hemoglobin 12.7 12.0 - 15.0 g/dL   HCT 14.740.0 82.936.0 - 56.246.0 %   MCV 87.3 80.0 - 100.0 fL   MCH 27.7 26.0 - 34.0 pg   MCHC 31.8 30.0 - 36.0 g/dL   RDW 13.013.2 86.511.5 - 78.415.5 %   Platelets 404 (H) 150 - 400 K/uL   nRBC 0.0 0.0 - 0.2 %   Neutrophils Relative % 72 %   Neutro Abs 11.1 (H) 1.7 - 7.7 K/uL   Lymphocytes  Relative 20 %   Lymphs Abs 3.1 0.7 - 4.0 K/uL   Monocytes Relative 5 %   Monocytes Absolute 0.8 0.1 - 1.0 K/uL   Eosinophils Relative 3 %   Eosinophils Absolute 0.4 0.0 - 0.5 K/uL   Basophils Relative 0 %   Basophils Absolute 0.1 0.0 - 0.1 K/uL   Immature Granulocytes 0 %   Abs  Immature Granulocytes 0.06 0.00 - 0.07 K/uL    Comment: Performed at [facility], [address], [place], [place] [address]  SARS Coronavirus 2 (CEPHEID - Performed in [facility] hospital lab), Hosp Order     Status: None   Collection Time: 10/04/18 10:44 PM   Specimen: Nasopharyngeal Swab  Result Value Ref Range   SARS Coronavirus 2 NEGATIVE NEGATIVE    Comment: (NOTE) If result is NEGATIVE SARS-CoV-2 target nucleic acids are NOT DETECTED. The SARS-CoV-2 RNA is generally detectable in upper and lower  respiratory specimens during the acute phase of infection. The lowest  concentration of SARS-CoV-2 viral copies this assay can detect is 250  copies / mL. A negative result does not preclude SARS-CoV-2 infection  and should not be used as the sole basis for treatment or other  patient management decisions.  A negative result may occur with  improper specimen collection / handling, submission of specimen other  than nasopharyngeal swab, presence of viral mutation(s) within the  areas targeted by this assay, and inadequate number of viral copies  (<250 copies / mL). A negative result must be combined with clinical  observations, patient history, and epidemiological information. If result is POSITIVE SARS-CoV-2 target nucleic acids are DETECTED. The SARS-CoV-2 RNA is generally detectable in upper and lower  respiratory specimens dur ing the acute phase of infection.  Positive  results are indicative of active infection with SARS-CoV-2.  Clinical  correlation with patient history and other diagnostic information is  necessary to determine patient infection status.  Positive results do  not rule out bacterial infection or co-infection with other viruses. If result is PRESUMPTIVE POSTIVE SARS-CoV-2 nucleic acids MAY BE PRESENT.   A presumptive positive result was obtained on the submitted specimen  and confirmed on repeat testing.  While 2019 novel coronavirus  (SARS-CoV-2) nucleic  acids may be present in the submitted sample  additional confirmatory testing may be necessary for epidemiological  and / or clinical management purposes  to differentiate between  SARS-CoV-2 and other Sarbecovirus currently known to infect humans.  If clinically indicated additional testing with an alternate test  methodology [phone]) is advised. The SARS-CoV-2 RNA is generally  detectable in upper and lower respiratory sp ecimens during the acute  phase of infection. The expected result is Negative. Fact Sheet for Patients:  BoilerBrush.com.cy Fact Sheet for Healthcare Providers: https://pope.com/ This test is not yet approved or cleared by the [place] FDA and has been authorized for detection and/or diagnosis of SARS-CoV-2 by FDA under an Emergency Use Authorization (EUA).  This EUA will remain in effect (meaning this test can be used) for the duration of the COVID-19 declaration under Section 564(b)(1) of the Act, 21 U.S.C. section 360bbb-3(b)(1), unless the authorization is terminated or revoked sooner. Performed at [facility], [address], [place], [place] [address]   Urine rapid drug screen (hosp performed)     Status: None   Collection Time: 10/04/18 11:05 PM  Result Value Ref Range   Opiates NONE DETECTED NONE DETECTED   Cocaine NONE DETECTED NONE DETECTED  Benzodiazepines NONE DETECTED NONE DETECTED   Amphetamines NONE DETECTED NONE DETECTED   Tetrahydrocannabinol NONE DETECTED NONE DETECTED   Barbiturates NONE DETECTED NONE DETECTED    Comment: (NOTE) DRUG SCREEN FOR MEDICAL PURPOSES ONLY.  IF CONFIRMATION IS NEEDED FOR ANY PURPOSE, NOTIFY LAB WITHIN 5 DAYS. LOWEST DETECTABLE LIMITS FOR URINE DRUG SCREEN Drug Class                     Cutoff (ng/mL) Amphetamine and metabolites    1000 Barbiturate and metabolites    200 Benzodiazepine                 644 Tricyclics and metabolites     300 Opiates  and metabolites        300 Cocaine and metabolites        300 THC                            50 Performed at [facility], [place] [address], [place], [place] [address]   Pregnancy, urine     Status: None   Collection Time: 10/04/18 11:05 PM  Result Value Ref Range   Preg Test, Ur NEGATIVE NEGATIVE    Comment: Performed at [facility] [address], [place], [place] [address]  Urinalysis, Routine w reflex microscopic     Status: Abnormal   Collection Time: 10/04/18 11:05 PM  Result Value Ref Range   Color, Urine YELLOW YELLOW   APPearance HAZY (A) CLEAR   Specific Gravity, Urine 1.020 1.005 - 1.030   pH 5.0 5.0 - 8.0   Glucose, UA NEGATIVE NEGATIVE mg/dL   Hgb urine dipstick NEGATIVE NEGATIVE   Bilirubin Urine NEGATIVE NEGATIVE   Ketones, ur NEGATIVE NEGATIVE mg/dL   Protein, ur NEGATIVE NEGATIVE mg/dL   Nitrite NEGATIVE NEGATIVE   Leukocytes,Ua NEGATIVE NEGATIVE    Comment: Performed at [facility] [address] [place], [place] [address]    Medications:  Current Facility-Administered Medications  Medication Dose Route Frequency Provider Last Rate Last Dose  . atorvastatin (LIPITOR) tablet 20 mg  20 mg Oral QHS Davonna Belling, MD   20 mg at 10/05/18 0110  . FLUoxetine (PROZAC) capsule 20 mg  20 mg Oral Daily Davonna Belling, MD   20 mg at 10/05/18 0916  . hydrOXYzine (ATARAX/VISTARIL) tablet 25 mg  25 mg Oral Lowanda Foster, MD   25 mg at 10/05/18 0112  . pantoprazole (PROTONIX) EC tablet 40 mg  40 mg Oral Daily Davonna Belling, MD   40 mg at 10/05/18 0916  . QUEtiapine (SEROQUEL) tablet 200 mg  200 mg Oral Lowanda Foster, MD   200 mg at 10/05/18 0114  . traZODone (DESYREL) tablet 100 mg  100 mg Oral QHS PRN Davonna Belling, MD       Current Outpatient Medications  Medication Sig Dispense Refill  . JUNEL 1.5/30 1.5-30 MG-MCG tablet Take 1 tablet by mouth daily.    Marland Kitchen acetaminophen (TYLENOL) 500 MG tablet Take 2 tablets (1,000 mg  total) by mouth every 8 (eight) hours as needed for moderate pain. 30 tablet 0  . atorvastatin (LIPITOR) 20 MG tablet Take 20 mg by mouth at bedtime.     Marland Kitchen FLUoxetine (PROZAC) 20 MG capsule Take 1 capsule (20 mg total) by mouth daily. For mood/anxiety 30 capsule 0  . hydrOXYzine (ATARAX/VISTARIL) 25 MG tablet Take 1 tablet (25 mg total) by mouth  at bedtime. For anxiety 30 tablet 0  . Multiple Vitamin (MULTIVITAMIN WITH MINERALS) TABS tablet Take 1 tablet by mouth daily.    . pantoprazole (PROTONIX) 40 MG tablet Take 1 tablet (40 mg total) by mouth daily. For reflux 30 tablet 0  . prazosin (MINIPRESS) 5 MG capsule Take 5 mg by mouth at bedtime as needed.    Marland Kitchen. QUEtiapine (SEROQUEL) 200 MG tablet Take 1 tablet (200 mg total) by mouth at bedtime. For mood 30 tablet 0  . traZODone (DESYREL) 100 MG tablet Take 1 tablet (100 mg total) by mouth at bedtime as needed for sleep. 30 tablet 0  . Vitamin D, Ergocalciferol, (DRISDOL) 1.25 MG (50000 UT) CAPS capsule Take 1 capsule by mouth once a week.      Musculoskeletal: Unable to assess  evaluation via telepsych  Psychiatric Specialty Exam: Physical Exam  Nursing note reviewed. Constitutional: She is oriented to person, place, and time.  Neurological: She is alert and oriented to person, place, and time.    ROS  Blood pressure 120/69, pulse 76, temperature 97.8 F (36.6 C), resp. rate 20, height 5\' 4"  (1.626 m), weight 117 kg, SpO2 99 %.Body mass index is 44.27 kg/m.  General Appearance: Fairly Groomed  Eye Contact:  Good  Speech:  Clear and Coherent and Normal Rate  Volume:  Normal  Mood:  Euthymic  Affect:  Appropriate  Thought Process:  Coherent, Linear and Descriptions of Associations: Intact  Orientation:  Full (Time, Place, and Person)  Thought Content:  WDL  Suicidal Thoughts:  No  Homicidal Thoughts:  No  Memory:  Immediate;   Fair Recent;   Fair  Judgement:  Fair  Insight:  Shallow  Psychomotor Activity:  Normal  Concentration:   Concentration: Fair and Attention Span: Fair  Recall:  FiservFair  Fund of Knowledge:  Fair  Language:  Good  Akathisia:  Negative  Handed:  Right  AIMS (if indicated):     Assets:  Communication Skills Resilience  ADL's:  Intact  Cognition:  WNL  Sleep:        Treatment Plan Summary: Daily contact with patient to assess and evaluate symptoms and progress in treatment  Disposition: No evidence of imminent risk to self or others at present.   Patient does not meet criteria for psychiatric inpatient admission. Reccommended to continue follow-up with his outpatient team..   ED patient nurse, updated on current disposition.    This service was provided via telemedicine using a 2-way, interactive audio and video technology.  Names of all persons participating in this telemedicine service and their role in this encounter. Name: Denzil MagnusonLaShunda Obdulio Mash  Role: FNP-C  Name: Tamara [name]  Role: Patient    Denzil MagnusonLaShunda Meleny [place]oning, NP 10/05/2018 12:47 PM

## 2018-10-17 ENCOUNTER — Observation Stay (HOSPITAL_COMMUNITY)
Admission: EM | Admit: 2018-10-17 | Discharge: 2018-10-18 | Disposition: A | Discharge status: Home / Self Care | Payer: Medicaid Other | Source: Intra-hospital | Attending: Psychiatry | Admitting: Psychiatry

## 2018-10-17 ENCOUNTER — Encounter (HOSPITAL_COMMUNITY): Payer: Self-pay

## 2018-10-17 ENCOUNTER — Encounter (HOSPITAL_COMMUNITY): Payer: Self-pay | Admitting: Emergency Medicine

## 2018-10-17 ENCOUNTER — Other Ambulatory Visit: Payer: Self-pay

## 2018-10-17 ENCOUNTER — Emergency Department (HOSPITAL_COMMUNITY)
Admission: EM | Admit: 2018-10-17 | Discharge: 2018-10-17 | Disposition: A | Discharge status: Other Acute Inpatient Hospital | Payer: Medicaid Other | Attending: Emergency Medicine | Admitting: Emergency Medicine

## 2018-10-17 DIAGNOSIS — F4310 Post-traumatic stress disorder, unspecified: Secondary | ICD-10-CM | POA: Diagnosis not present

## 2018-10-17 DIAGNOSIS — G8929 Other chronic pain: Secondary | ICD-10-CM | POA: Diagnosis not present

## 2018-10-17 DIAGNOSIS — Z79899 Other long term (current) drug therapy: Secondary | ICD-10-CM | POA: Insufficient documentation

## 2018-10-17 DIAGNOSIS — F333 Major depressive disorder, recurrent, severe with psychotic symptoms: Secondary | ICD-10-CM | POA: Diagnosis present

## 2018-10-17 DIAGNOSIS — F909 Attention-deficit hyperactivity disorder, unspecified type: Secondary | ICD-10-CM | POA: Insufficient documentation

## 2018-10-17 DIAGNOSIS — Z20828 Contact with and (suspected) exposure to other viral communicable diseases: Secondary | ICD-10-CM | POA: Insufficient documentation

## 2018-10-17 DIAGNOSIS — F603 Borderline personality disorder: Principal | ICD-10-CM | POA: Insufficient documentation

## 2018-10-17 DIAGNOSIS — F840 Autistic disorder: Secondary | ICD-10-CM | POA: Insufficient documentation

## 2018-10-17 DIAGNOSIS — F319 Bipolar disorder, unspecified: Secondary | ICD-10-CM | POA: Diagnosis not present

## 2018-10-17 DIAGNOSIS — F22 Delusional disorders: Secondary | ICD-10-CM | POA: Diagnosis present

## 2018-10-17 DIAGNOSIS — K219 Gastro-esophageal reflux disease without esophagitis: Secondary | ICD-10-CM | POA: Diagnosis not present

## 2018-10-17 DIAGNOSIS — R45851 Suicidal ideations: Secondary | ICD-10-CM

## 2018-10-17 DIAGNOSIS — M549 Dorsalgia, unspecified: Secondary | ICD-10-CM | POA: Diagnosis not present

## 2018-10-17 LAB — CBC
HCT: 42.7 % (ref 36.0–46.0)
Hemoglobin: 13.6 g/dL (ref 12.0–15.0)
MCH: 28 pg (ref 26.0–34.0)
MCHC: 31.9 g/dL (ref 30.0–36.0)
MCV: 87.9 fL (ref 80.0–100.0)
Platelets: 437 10*3/uL — ABNORMAL HIGH (ref 150–400)
RBC: 4.86 MIL/uL (ref 3.87–5.11)
RDW: 13 % (ref 11.5–15.5)
WBC: 18.1 10*3/uL — ABNORMAL HIGH (ref 4.0–10.5)
nRBC: 0 % (ref 0.0–0.2)

## 2018-10-17 LAB — COMPREHENSIVE METABOLIC PANEL
ALT: 16 U/L (ref 0–44)
AST: 18 U/L (ref 15–41)
Albumin: 3.4 g/dL — ABNORMAL LOW (ref 3.5–5.0)
Alkaline Phosphatase: 124 U/L (ref 38–126)
Anion gap: 13 (ref 5–15)
BUN: 6 mg/dL (ref 6–20)
CO2: 22 mmol/L (ref 22–32)
Calcium: 9.2 mg/dL (ref 8.9–10.3)
Chloride: 105 mmol/L (ref 98–111)
Creatinine, Ser: 0.68 mg/dL (ref 0.44–1.00)
GFR calc Af Amer: >60 mL/min (ref >60)
GFR calc non Af Amer: >60 mL/min (ref >60)
Glucose, Bld: 84 mg/dL (ref 70–99)
Potassium: 3.7 mmol/L (ref 3.5–5.1)
Sodium: 140 mmol/L (ref 135–145)
Total Bilirubin: 0.4 mg/dL (ref 0.3–1.2)
Total Protein: 7.3 g/dL (ref 6.5–8.1)

## 2018-10-17 LAB — I-STAT BETA HCG BLOOD, ED (MC, WL, AP ONLY): I-stat hCG, quantitative: <5 m[IU]/mL (ref <5)

## 2018-10-17 LAB — RAPID URINE DRUG SCREEN, HOSP PERFORMED
Amphetamines: NOT DETECTED
Barbiturates: NOT DETECTED
Benzodiazepines: NOT DETECTED
Cocaine: NOT DETECTED
Opiates: NOT DETECTED
Tetrahydrocannabinol: NOT DETECTED

## 2018-10-17 LAB — URINALYSIS, ROUTINE W REFLEX MICROSCOPIC
Bilirubin Urine: NEGATIVE
Glucose, UA: NEGATIVE mg/dL
Hgb urine dipstick: NEGATIVE
Ketones, ur: NEGATIVE mg/dL
Leukocytes,Ua: NEGATIVE
Nitrite: NEGATIVE
Protein, ur: NEGATIVE mg/dL
Specific Gravity, Urine: 1.024 (ref 1.005–1.030)
pH: 5 (ref 5.0–8.0)

## 2018-10-17 LAB — POC URINE PREG, ED: Preg Test, Ur: NEGATIVE

## 2018-10-17 LAB — SALICYLATE LEVEL: Salicylate Lvl: <7 mg/dL (ref 2.8–30.0)

## 2018-10-17 LAB — SARS CORONAVIRUS 2 BY RT PCR (HOSPITAL ORDER, PERFORMED IN ~~LOC~~ HOSPITAL LAB): SARS Coronavirus 2: NEGATIVE

## 2018-10-17 LAB — ACETAMINOPHEN LEVEL: Acetaminophen (Tylenol), Serum: <10 ug/mL — ABNORMAL LOW (ref 10–30)

## 2018-10-17 LAB — ETHANOL: Alcohol, Ethyl (B): <10 mg/dL (ref <10)

## 2018-10-17 MED ORDER — TRAZODONE HCL 100 MG PO TABS: 100.0000 mg | ORAL_TABLET | Freq: Every evening | ORAL | Status: DC | PRN

## 2018-10-17 MED ORDER — PRAZOSIN HCL 5 MG PO CAPS: 5.0000 mg | ORAL_CAPSULE | Freq: Every evening | ORAL | Status: DC | PRN

## 2018-10-17 MED ORDER — MAGNESIUM HYDROXIDE 400 MG/5ML PO SUSP: 30.0000 mL | Freq: Every day | ORAL | Status: DC | PRN

## 2018-10-17 MED ORDER — QUETIAPINE FUMARATE 200 MG PO TABS
200.0000 mg | ORAL_TABLET | Freq: Every day | ORAL | Status: DC
  Administered 2018-10-17: 22:00:00 200 mg via ORAL
  Filled 2018-10-17: qty 1

## 2018-10-17 MED ORDER — VORTIOXETINE HBR 10 MG PO TABS
10.0000 mg | ORAL_TABLET | Freq: Every day | ORAL | Status: DC
  Administered 2018-10-17 – 2018-10-18 (×2): 10 mg via ORAL
  Filled 2018-10-17 (×5): qty 1

## 2018-10-17 MED ORDER — HYDROXYZINE HCL 25 MG PO TABS
25.0000 mg | ORAL_TABLET | Freq: Every day | ORAL | Status: DC
  Administered 2018-10-17: 25 mg via ORAL
  Filled 2018-10-17: qty 1

## 2018-10-17 MED ORDER — PANTOPRAZOLE SODIUM 40 MG PO TBEC
40.0000 mg | DELAYED_RELEASE_TABLET | Freq: Every day | ORAL | Status: DC
  Administered 2018-10-17 – 2018-10-18 (×2): 40 mg via ORAL
  Filled 2018-10-17 (×2): qty 1

## 2018-10-17 MED ORDER — ACETAMINOPHEN 325 MG PO TABS
650.0000 mg | ORAL_TABLET | Freq: Once | ORAL | Status: AC
  Administered 2018-10-17: 650 mg via ORAL
  Filled 2018-10-17: qty 2

## 2018-10-17 MED ORDER — FLUOXETINE HCL 20 MG PO CAPS
20.0000 mg | ORAL_CAPSULE | Freq: Every day | ORAL | Status: DC
  Administered 2018-10-17 – 2018-10-18 (×2): 20 mg via ORAL
  Filled 2018-10-17 (×2): qty 1

## 2018-10-17 MED ORDER — LURASIDONE HCL 60 MG PO TABS
120.0000 mg | ORAL_TABLET | Freq: Every day | ORAL | Status: DC
  Administered 2018-10-17 – 2018-10-18 (×2): 120 mg via ORAL
  Filled 2018-10-17 (×2): qty 2

## 2018-10-17 MED ORDER — VITAMIN D (ERGOCALCIFEROL) 1.25 MG (50000 UNIT) PO CAPS
50000.0000 [IU] | ORAL_CAPSULE | ORAL | Status: DC
  Administered 2018-10-18: 50000 [IU] via ORAL
  Filled 2018-10-17: qty 1

## 2018-10-17 MED ORDER — NORETHINDRONE ACET-ETHINYL EST 1.5-30 MG-MCG PO TABS: 1.0000 | ORAL_TABLET | Freq: Every day | ORAL | Status: DC

## 2018-10-17 MED ORDER — ACETAMINOPHEN 325 MG PO TABS: 650.0000 mg | ORAL_TABLET | Freq: Four times a day (QID) | ORAL | Status: DC | PRN

## 2018-10-17 MED ORDER — ADULT MULTIVITAMIN W/MINERALS CH
1.0000 | ORAL_TABLET | Freq: Every day | ORAL | Status: DC
  Administered 2018-10-17 – 2018-10-18 (×2): 1 via ORAL
  Filled 2018-10-17 (×2): qty 1

## 2018-10-17 MED ORDER — ATORVASTATIN CALCIUM 10 MG PO TABS
20.0000 mg | ORAL_TABLET | Freq: Every day | ORAL | Status: DC
  Administered 2018-10-17: 20 mg via ORAL
  Filled 2018-10-17: qty 2

## 2018-10-17 MED ORDER — ALUM & MAG HYDROXIDE-SIMETH 200-200-20 MG/5ML PO SUSP: 30.0000 mL | ORAL | Status: DC | PRN

## 2018-10-17 NOTE — ED Triage Notes (Signed)
Presents to ed via [facility] c/o generalized bodyaches, and headache, ems states patient didn't want to talk.
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
## 2018-10-17 NOTE — ED Notes (Signed)
Pt notified that we need a urine sample but unable to go at this time. 

## 2018-10-17 NOTE — Progress Notes (Signed)
[place] NOVEL CORONAVIRUS (COVID-19) DAILY CHECK-OFF SYMPTOMS - answer yes or no to each - every day NO YES  Have you had a fever in the past 24 hours?  . Fever (Temp > 37.80C / 100F) X   Have you had any of these symptoms in the past 24 hours? . New Cough .  Sore Throat  .  Shortness of Breath .  Difficulty Breathing .  Unexplained Body Aches   X   Have you had any one of these symptoms in the past 24 hours not related to allergies?   . Runny Nose .  Nasal Congestion .  Sneezing   X   If you have had runny nose, nasal congestion, sneezing in the past 24 hours, has it worsened?  X   EXPOSURES - check yes or no X   Have you traveled outside the state in the past 14 days?  X   Have you been in contact with someone with a confirmed diagnosis of COVID-19 or PUI in the past 14 days without wearing appropriate PPE?  X   Have you been living in the same home as a person with confirmed diagnosis of COVID-19 or a PUI (household contact)?    X   Have you been diagnosed with COVID-19?    X              What to do next: Answered NO to all: Answered YES to anything:   Proceed with unit schedule Follow the [facility] Inpatient Flowsheet.   

## 2018-10-17 NOTE — ED Notes (Signed)
Pt transferred via [facility] to [facility]. Report given to [facility], RN prior to departure from ED. Belongings and security lockup Items given to driver. Paperwork also given to driver.

## 2018-10-17 NOTE — ED Notes (Signed)
This RN asked pt why she came to ED, pt says she is in pain from head to toe. Pt says she cannot remember anything about herself. Pt says she doesn't want to be alive. Pt asked this RN if it is legal to do euthanasia in [place]. Pt keeps talking about how all medical professionals hate all homosexuals.

## 2018-10-17 NOTE — ED Notes (Signed)
Called [facility]  

## 2018-10-17 NOTE — ED Notes (Signed)
Lunch tray ordered 
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
## 2018-10-17 NOTE — ED Notes (Signed)
Pt has been accepted to the Observation Unit at [facility]. A COVID screen *must* be performed and the results *must* be provided to [facility] prior to report being called. This information was provided to pt's nurse, Ronalee Belts, at [phone].  Room: 205-1 Accepting: Agustina Caroli, NP Attending: Dr. Dwyane Dee Call to Report: [phone]

## 2018-10-17 NOTE — ED Provider Notes (Signed)
[facility] EMERGENCY DEPARTMENT Provider Note   CSN: [id number] Arrival date & time: 10/17/18  8756    History   Chief Complaint Chief Complaint  Patient presents with  . Suicidal    HPI Tamara [name] is a 23 y.o. female.     The history is provided by the patient.  Mental Health Problem Presenting symptoms: delusional, suicidal thoughts and suicide attempt   Patient accompanied by:  Law enforcement Degree of incapacity (severity):  Moderate Onset quality:  Gradual Timing:  Intermittent Progression:  Waxing and waning Chronicity:  Recurrent Context: noncompliance   Relieved by:  Nothing Worsened by:  Family interactions Associated symptoms: poor judgment   Associated symptoms: no abdominal pain and no chest pain   Risk factors: hx of mental illness     Past Medical History:  Diagnosis Date  . ADHD   . Autism   . Bipolar disorder ([place])   . Chronic back pain    "mostly lower; sometimes all over" (03/22/2017)  . Chronic stomach ulcer   . Depression   . GERD (gastroesophageal reflux disease)   . Headache    "a few/week" (03/22/2017)  . Migraine    "monthly recently" (03/22/2017)  . Personality disorder ([place])   . PTSD (post-traumatic stress disorder)   . Seizures ([place])    "very random; I lose memory of what happens; usually happens when I'm in bed; probably 2-3/year" (03/22/2017)    Patient Active Problem List   Diagnosis Date Noted  . Bipolar 1 disorder ([place]) 08/25/2018  . MDD (major depressive disorder), single episode, severe with psychotic features ([place]) 07/27/2018  . Bipolar affective disorder, depressed, moderate ([place]) 07/09/2018  . Borderline personality disorder ([place]) 12/20/2016    History reviewed. No pertinent surgical history.   OB History   No obstetric history on file.      Home Medications    Prior to Admission medications   Medication Sig Start Date End Date Taking? Authorizing Provider  acetaminophen  (TYLENOL) 500 MG tablet Take 2 tablets (1,000 mg total) by mouth every 8 (eight) hours as needed for moderate pain. 09/19/18   Law, Bea Graff, PA-C  atorvastatin (LIPITOR) 20 MG tablet Take 20 mg by mouth at bedtime.     [provider]  FLUoxetine (PROZAC) 20 MG capsule Take 1 capsule (20 mg total) by mouth daily. For mood/anxiety 07/15/18   Connye Burkitt, NP  hydrOXYzine (ATARAX/VISTARIL) 25 MG tablet Take 1 tablet (25 mg total) by mouth at bedtime. For anxiety 07/14/18   Connye Burkitt, NP  JUNEL 1.5/30 1.5-30 MG-MCG tablet Take 1 tablet by mouth daily. 09/23/18   [provider]  Lurasidone HCl (LATUDA) 120 MG TABS Take 120 mg by mouth daily.    [provider]  Multiple Vitamin (MULTIVITAMIN WITH MINERALS) TABS tablet Take 1 tablet by mouth daily.    [provider]  pantoprazole (PROTONIX) 40 MG tablet Take 1 tablet (40 mg total) by mouth daily. For reflux 07/15/18   Connye Burkitt, NP  prazosin (MINIPRESS) 5 MG capsule Take 5 mg by mouth at bedtime as needed. 07/06/18   [provider]  QUEtiapine (SEROQUEL) 200 MG tablet Take 1 tablet (200 mg total) by mouth at bedtime. For mood 07/14/18   Connye Burkitt, NP  traZODone (DESYREL) 100 MG tablet Take 1 tablet (100 mg total) by mouth at bedtime as needed for sleep. 07/14/18   Connye Burkitt, NP  Vitamin D, Ergocalciferol, (DRISDOL) 1.25  MG (50000 UT) CAPS capsule Take 1 capsule by mouth once a week. 07/06/18   [provider]  vortioxetine HBr (TRINTELLIX) 10 MG TABS tablet Take 10 mg by mouth daily.    [provider]    Family History Family History  Problem Relation Age of Onset  . Diabetes Mother   . Hypertension Maternal Grandmother   . Hyperlipidemia Maternal Grandmother   . Diabetes Maternal Grandmother     Social History Social History   Tobacco Use  . Smoking status: Never Smoker  . Smokeless tobacco: Never Used  Substance Use Topics  . Alcohol use: No    Alcohol/week:  0.0 standard drinks  . Drug use: No     Allergies   Asa [aspirin], Pork-derived products, and Lithium   Review of Systems Review of Systems  Constitutional: Negative for chills and fever.  HENT: Negative for ear pain and sore throat.   Eyes: Negative for pain and visual disturbance.  Respiratory: Negative for cough and shortness of breath.   Cardiovascular: Negative for chest pain and palpitations.  Gastrointestinal: Negative for abdominal pain and vomiting.  Genitourinary: Negative for dysuria and hematuria.  Musculoskeletal: Negative for arthralgias and back pain.  Skin: Negative for color change and rash.  Neurological: Negative for seizures and syncope.  Psychiatric/Behavioral: Positive for confusion and suicidal ideas.  All other systems reviewed and are negative.    Physical Exam Updated Vital Signs  ED Triage Vitals  Enc Vitals Group     BP 10/17/18 0639 (!) 130/105     Pulse Rate 10/17/18 0639 97     Resp 10/17/18 0639 18     Temp 10/17/18 0639 98.1 F (36.7 C)     Temp Source 10/17/18 0639 Oral     SpO2 10/17/18 0639 100 %     Weight 10/17/18 0656 257 lb 15 oz (117 kg)     Height 10/17/18 0656 5\' 4"  (1.626 m)     Head Circumference --      Peak Flow --      Pain Score 10/17/18 0655 10     Pain Loc --      Pain Edu? --      Excl. in GC? --     Physical Exam Vitals signs and nursing note reviewed.  Constitutional:      General: She is not in acute distress.    Appearance: She is well-developed. She is not ill-appearing.  HENT:     Head: Normocephalic and atraumatic.     Nose: Nose normal.     Mouth/Throat:     Mouth: Mucous membranes are moist.  Eyes:     Extraocular Movements: Extraocular movements intact.     Conjunctiva/sclera: Conjunctivae normal.     Pupils: Pupils are equal, round, and reactive to light.  Neck:     Musculoskeletal: Neck supple.  Cardiovascular:     Rate and Rhythm: Normal rate and regular rhythm.     Pulses: Normal  pulses.     Heart sounds: Normal heart sounds. No murmur.  Pulmonary:     Effort: Pulmonary effort is normal. No respiratory distress.     Breath sounds: Normal breath sounds.  Abdominal:     Palpations: Abdomen is soft.     Tenderness: There is no abdominal tenderness.  Skin:    General: Skin is warm and dry.     Capillary Refill: Capillary refill takes less than 2 seconds.  Neurological:     General: No focal deficit  present.     Mental Status: She is alert.  Psychiatric:        Thought Content: Thought content includes suicidal ideation.     Comments: Patient with bizarre thought process, states that she wants to be euthanized, suicidal but no plan, has tangential thought process      ED Treatments / Results  Labs (all labs ordered are listed, but only abnormal results are displayed) Labs Reviewed  COMPREHENSIVE METABOLIC PANEL  ETHANOL  SALICYLATE LEVEL  ACETAMINOPHEN LEVEL  CBC  RAPID URINE DRUG SCREEN, HOSP PERFORMED  I-STAT BETA HCG BLOOD, ED ([facility], [facility], [facility] ONLY)    EKG None  Radiology No results found.  Procedures Procedures (including critical care time)  Medications Ordered in ED Medications  acetaminophen (TYLENOL) tablet 650 mg (650 mg Oral Given 10/17/18 0752)     Initial Impression / Assessment and Plan / ED Course  I have reviewed the triage vital signs and the nursing notes.  Pertinent labs & imaging results that were available during my care of the patient were reviewed by me and considered in my medical decision making (see chart for details).     Tamara [name] is a 23 year old female here with suicidal ideation.  History of depression, personality disorder.  Normal vitals.  No fever.  States that she wants to be euthanized.  States that she feels depressed that no one believes her about her feelings.  She has very bizarre thought process.  Tangential speech.  States that she is suicidal but does not remember what her plan is.  Denies any  alcohol or drug abuse.  She overall appears medically stable but does endorse some concerning symptoms from a mental health standpoint.  I am unable to contract for safety.  I suspect that some of these feelings are chronic overall but do not know her very well.  Suspect underlying personality disorder for cause of some of her statements today.  She states that she called the suicide hotline and they recommended that she come to the emergency department.  She is here voluntarily.  Patient appears medically cleared and will have her talk with one of the behavioral health providers for possible inpatient care.  Awaiting psychiatric disposition.  This chart was dictated using voice recognition software.  Despite best efforts to proofread,  errors can occur which can change the documentation meaning.    Final Clinical Impressions(s) / ED Diagnoses   Final diagnoses:  Suicidal ideation    ED Discharge Orders    None       Virgina NorfolkCuratolo, Rikita Grabert, DO 10/17/18 0809

## 2018-10-18 ENCOUNTER — Encounter (HOSPITAL_COMMUNITY): Payer: Self-pay | Admitting: Registered Nurse

## 2018-10-18 DIAGNOSIS — F603 Borderline personality disorder: Secondary | ICD-10-CM

## 2018-10-18 NOTE — Discharge Summary (Addendum)
[facility]  10/18/2018 12:32 PM Tamara MoritaQueenie Rhea [name]  MRN:  [id number] Principal Problem: Borderline personality disorder [facility](HCC) Discharge Diagnoses: Principal Problem:   Borderline personality disorder [facility](HCC) Active Problems:   Major depressive disorder, recurrent, severe with psychotic features (HCC)   Subjective: Tamara [name], 23 y.o., female patient seen face to face by this provider, Dr. Sharma CovertNorman; and chart reviewed on 10/18/18.  On evaluation Tamara [name] reports she has amnesia and can't remember "who I am.  It's just been really intense since last week.  My doctor told me I needed to be out of work until I can get my memory back.  My therapist told me that my doctor can't help me with that.  I just got up set because she doesn't understand.  Regardless of what I said she has no right to yell at me; and I want another therapist."  Patient has a significant history of reporting amnesia on and off; also reports she has multiple personalities that causes her to lose time.  At this time patient denies suicidal/self-harm/homicidal ideation, psychosis, and paranoia. Provider familiar with patient and patient at baseline.  Patient currently has outpatient psychiatric services and lives with 2 roommates whom she states is supportive.   During evaluation Tamara [name] is alert/oriented x 4; calm/cooperative; and mood is congruent with affect.  She does not appear to be responding to internal/external stimuli or delusional thoughts other than reporting amnesia.  Patient denies suicidal/self-harm/homicidal ideation, psychosis, and paranoia.  Patient answered question appropriately.    Total Time spent with patient: 45 minutes  Past Psychiatric History: Significant psychiatric history borderline personality disorder, Bipolar disorder, Major depressive disorder  Past Medical History:  Past Medical History:  Diagnosis Date  . ADHD   . Autism   . Bipolar  disorder (HCC)   . Chronic back pain    "mostly lower; sometimes all over" (03/22/2017)  . Chronic stomach ulcer   . Depression   . GERD (gastroesophageal reflux disease)   . Headache    "a few/week" (03/22/2017)  . Migraine    "monthly recently" (03/22/2017)  . Personality disorder (HCC)   . PTSD (post-traumatic stress disorder)   . Seizures (HCC)    "very random; I lose memory of what happens; usually happens when I'm in bed; probably 2-3/year" (03/22/2017)   History reviewed. No pertinent surgical history. Family History:  Family History  Problem Relation Age of Onset  . Diabetes Mother   . Hypertension Maternal Grandmother   . Hyperlipidemia Maternal Grandmother   . Diabetes Maternal Grandmother    Family Psychiatric  History: Father-alcoholism and drug abuse and mother-BPAD("She hasserial killer tendencies.")  Social History:  Social History   Substance and Sexual Activity  Alcohol Use No  . Alcohol/week: 0.0 standard drinks     Social History   Substance and Sexual Activity  Drug Use No    Social History   Socioeconomic History  . Marital status: Single    Spouse name: Not on file  . Number of children: Not on file  . Years of education: Not on file  . Highest education level: Not on file  Occupational History  . Not on file  Social Needs  . Financial resource strain: Not on file  . Food insecurity    Worry: Not on file    Inability: Not on file  . Transportation needs    Medical: Not on file    Non-medical: Not on file  Tobacco Use  .  Smoking status: Never Smoker  . Smokeless tobacco: Never Used  Substance and Sexual Activity  . Alcohol use: No    Alcohol/week: 0.0 standard drinks  . Drug use: No  . Sexual activity: Not Currently  Lifestyle  . Physical activity    Days per week: Not on file    Minutes per session: Not on file  . Stress: Not on file  Relationships  . Social Musicianconnections    Talks on phone: Not on file    Gets together: Not on  file    Attends religious service: Not on file    Active member of club or organization: Not on file    Attends meetings of clubs or organizations: Not on file    Relationship status: Not on file  Other Topics Concern  . Not on file  Social History Narrative  . Not on file    Has this patient used any form of tobacco in the last 30 days? (Cigarettes, Smokeless Tobacco, Cigars, and/or Pipes) Prescription not provided because: Patient does not use tobacco products  Current Medications: Current Facility-Administered Medications  Medication Dose Route Frequency Provider Last Rate Last Dose  . acetaminophen (TYLENOL) tablet 650 mg  650 mg Oral Q6H PRN Nwoko, Agnes I, NP      . alum & mag hydroxide-simeth (MAALOX/MYLANTA) 200-200-20 MG/5ML suspension 30 mL  30 mL Oral Q4H PRN Nwoko, Agnes I, NP      . atorvastatin (LIPITOR) tablet 20 mg  20 mg Oral QHS Nwoko, Agnes I, NP   20 mg at 10/17/18 2204  . FLUoxetine (PROZAC) capsule 20 mg  20 mg Oral Daily Armandina StammerNwoko, Agnes I, NP   20 mg at 10/18/18 0733  . hydrOXYzine (ATARAX/VISTARIL) tablet 25 mg  25 mg Oral QHS Armandina StammerNwoko, Agnes I, NP   25 mg at 10/17/18 2204  . Lurasidone HCl TABS 120 mg  120 mg Oral Daily Armandina StammerNwoko, Agnes I, NP   120 mg at 10/18/18 0733  . magnesium hydroxide (MILK OF MAGNESIA) suspension 30 mL  30 mL Oral Daily PRN Armandina StammerNwoko, Agnes I, NP      . multivitamin with minerals tablet 1 tablet  1 tablet Oral Daily Armandina StammerNwoko, Agnes I, NP   1 tablet at 10/18/18 0732  . Norethindrone Acetate-Ethinyl Estradiol (LOESTRIN) 1.5-30 MG-MCG tablet 1 tablet  1 tablet Oral Daily Nwoko, Agnes I, NP      . pantoprazole (PROTONIX) EC tablet 40 mg  40 mg Oral Daily Armandina StammerNwoko, Agnes I, NP   40 mg at 10/18/18 0733  . prazosin (MINIPRESS) capsule 5 mg  5 mg Oral QHS PRN Armandina StammerNwoko, Agnes I, NP      . QUEtiapine (SEROQUEL) tablet 200 mg  200 mg Oral QHS Nwoko, Agnes I, NP   200 mg at 10/17/18 2204  . traZODone (DESYREL) tablet 100 mg  100 mg Oral QHS PRN Sanjuana KavaNwoko, Agnes I, NP      .  Vitamin D (Ergocalciferol) (DRISDOL) capsule 50,000 Units  50,000 Units Oral Weekly Armandina StammerNwoko, Agnes I, NP   50,000 Units at 10/18/18 0748  . vortioxetine HBr (TRINTELLIX) tablet 10 mg  10 mg Oral Daily Armandina StammerNwoko, Agnes I, NP   10 mg at 10/18/18 0747   PTA Medications: Medications Prior to Admission  Medication Sig Dispense Refill Last Dose  . acetaminophen (TYLENOL) 500 MG tablet Take 2 tablets (1,000 mg total) by mouth every 8 (eight) hours as needed for moderate pain. 30 tablet 0   . atorvastatin (LIPITOR) 20 MG tablet Take  20 mg by mouth at bedtime.      Marland Kitchen. FLUoxetine (PROZAC) 20 MG capsule Take 1 capsule (20 mg total) by mouth daily. For mood/anxiety 30 capsule 0   . hydrOXYzine (ATARAX/VISTARIL) 25 MG tablet Take 1 tablet (25 mg total) by mouth at bedtime. For anxiety 30 tablet 0   . JUNEL 1.5/30 1.5-30 MG-MCG tablet Take 1 tablet by mouth daily.     . Lurasidone HCl (LATUDA) 120 MG TABS Take 120 mg by mouth daily.     . Multiple Vitamin (MULTIVITAMIN WITH MINERALS) TABS tablet Take 1 tablet by mouth daily.     . pantoprazole (PROTONIX) 40 MG tablet Take 1 tablet (40 mg total) by mouth daily. For reflux 30 tablet 0   . prazosin (MINIPRESS) 5 MG capsule Take 5 mg by mouth at bedtime as needed.     Marland Kitchen. QUEtiapine (SEROQUEL) 200 MG tablet Take 1 tablet (200 mg total) by mouth at bedtime. For mood 30 tablet 0   . traZODone (DESYREL) 100 MG tablet Take 1 tablet (100 mg total) by mouth at bedtime as needed for sleep. 30 tablet 0   . Vitamin D, Ergocalciferol, (DRISDOL) 1.25 MG (50000 UT) CAPS capsule Take 1 capsule by mouth once a week.     . vortioxetine HBr (TRINTELLIX) 10 MG TABS tablet Take 10 mg by mouth daily.       Musculoskeletal: Strength & Muscle Tone: within normal limits Gait & Station: normal Patient leans: N/A  Psychiatric Specialty Exam: Physical Exam  Nursing note and vitals reviewed. Constitutional: She is oriented to person, place, and time. She appears well-developed and  well-nourished. No distress.  Neck: Normal range of motion.  Respiratory: Effort normal.  Musculoskeletal: Normal range of motion.  Neurological: She is alert and oriented to person, place, and time.  Skin: Skin is warm and dry.  Psychiatric: Her speech is normal and behavior is normal. Thought content normal. Anxious: Stable. Impaired: Reporting she has amnesia; but able to give significant amount of information about issues going on currently and in past. Depressed: Stable.    Review of Systems  Psychiatric/Behavioral: Depression: Stable. Hallucinations: Denies. Memory loss: States she has amnesia. Substance abuse: Denies. Suicidal ideas: Denies. Insomnia: Stable.   All other systems reviewed and are negative.   Blood pressure 121/83, pulse (!) 112, temperature 98.4 F (36.9 C), temperature source Oral, resp. rate 18, SpO2 98 %.There is no height or weight on file to calculate BMI.  General Appearance: Casual  Eye Contact:  Good  Speech:  Blocked and Normal Rate  Volume:  Normal  Mood:  Appropriate  Affect:  Congruent  Thought Process:  Coherent and Descriptions of Associations: Intact  Orientation:  Full (Time, Place, and Person)  Thought Content:  Denies hallucinations, delusions, and paranoia  Suicidal Thoughts:  No  Homicidal Thoughts:  No  Memory:  Immediate;   Good Recent;   Good Remote;   Good  Judgement:  Fair  Insight:  Present  Psychomotor Activity:  Normal  Concentration:  Concentration: Good and Attention Span: Good  Recall:  Good  Fund of Knowledge:  Good  Language:  Good  Akathisia:  No  Handed:  Right  AIMS (if indicated):   N/A  Assets:  Communication Skills Desire for Improvement Housing Social Support  ADL's:  Intact  Cognition:  WNL  Sleep:   N/A     Demographic Factors:  Caucasian  Loss Factors: NA  Historical Factors: NA  Risk Reduction Factors:  Living with another person, especially a relative, Positive social support and Positive  therapeutic relationship  Continued Clinical Symptoms:  Previous Psychiatric Diagnoses and Treatments  Cognitive Features That Contribute To Risk:  None    Suicide Risk:  Minimal: No identifiable suicidal ideation.  Patients presenting with no risk factors but with morbid ruminations; may be classified as minimal risk based on the severity of the depressive symptoms    Plan Of Care/Follow-up recommendations:  Activity:  As tolerated Diet:  Heart healthy Other:  Follow up with current outpatient psychiatric provider  Disposition: No evidence of imminent risk to self or others at present.   Patient does not meet criteria for psychiatric inpatient admission. Supportive therapy provided about ongoing stressors. Discussed crisis plan, support from social network, calling 911, coming to the Emergency Department, and calling Suicide Hotline.   Shuvon Rankin, NP 10/18/2018, 12:32 PM    Patient seen face-to-face for psychiatric evaluation, chart reviewed and case discussed with the physician extender and developed treatment plan. Reviewed the information documented and agree with the treatment plan.  Buford Dresser, DO 10/18/18 4:24 PM

## 2018-10-18 NOTE — Discharge Instructions (Signed)
For your behavioral health needs, you are advised to follow up with your current outpatient providers. °

## 2018-10-18 NOTE — BH Assessment (Signed)
[facility] Assessment Progress Note  Per Buford Dresser, DO, this pt does not require psychiatric hospitalization at this time.  Pt is to be discharged from the [facility] Observation Unit with recommendation to follow up with her current unspecified outpatient providers.  This has been included in pt's discharge instructions.  Pt's nurse, Maudie Mercury, has been notified.  Jalene Mullet, [place] Triage Specialist [phone]

## 2018-10-18 NOTE — Progress Notes (Signed)
Patient ID: Tamara [name], female   DOB: 07/28/1995, 23 y.o.   MRN: [id number]  [place] NOVEL CORONAVIRUS (COVID-19) DAILY CHECK-OFF SYMPTOMS - answer yes or no to each - every day NO YES  Have you had a fever in the past 24 hours?  . Fever (Temp > 37.80C / 100F) X   Have you had any of these symptoms in the past 24 hours? . New Cough .  Sore Throat  .  Shortness of Breath .  Difficulty Breathing .  Unexplained Body Aches   X   Have you had any one of these symptoms in the past 24 hours not related to allergies?   . Runny Nose .  Nasal Congestion .  Sneezing   X   If you have had runny nose, nasal congestion, sneezing in the past 24 hours, has it worsened?  X   EXPOSURES - check yes or no X   Have you traveled outside the state in the past 14 days?  X   Have you been in contact with someone with a confirmed diagnosis of COVID-19 or PUI in the past 14 days without wearing appropriate PPE?  X   Have you been living in the same home as a person with confirmed diagnosis of COVID-19 or a PUI (household contact)?    X   Have you been diagnosed with COVID-19?    X              What to do next: Answered NO to all: Answered YES to anything:   Proceed with unit schedule Follow the [facility] Inpatient Flowsheet.

## 2018-10-18 NOTE — Progress Notes (Signed)
Patient ID: Tamara [name], female   DOB: 1996-01-28, 23 y.o.   MRN: [id number] Patient discharged per MD orders. Patient was  given belongings before discharge. Patient currently denies SI/HI and auditory and visual hallucinations on discharge.  Patient to take bus home. Directions given to bus stop.

## 2018-11-01 ENCOUNTER — Encounter (HOSPITAL_COMMUNITY): Payer: Self-pay

## 2018-11-01 ENCOUNTER — Emergency Department (HOSPITAL_COMMUNITY)
Admission: EM | Admit: 2018-11-01 | Discharge: 2018-11-02 | Disposition: A | Discharge status: Home / Self Care | Payer: Medicaid Other | Attending: Emergency Medicine | Admitting: Emergency Medicine

## 2018-11-01 DIAGNOSIS — F4310 Post-traumatic stress disorder, unspecified: Secondary | ICD-10-CM | POA: Insufficient documentation

## 2018-11-01 DIAGNOSIS — F603 Borderline personality disorder: Secondary | ICD-10-CM | POA: Insufficient documentation

## 2018-11-01 DIAGNOSIS — F333 Major depressive disorder, recurrent, severe with psychotic symptoms: Secondary | ICD-10-CM | POA: Insufficient documentation

## 2018-11-01 DIAGNOSIS — F840 Autistic disorder: Secondary | ICD-10-CM | POA: Diagnosis not present

## 2018-11-01 DIAGNOSIS — Z638 Other specified problems related to primary support group: Secondary | ICD-10-CM | POA: Insufficient documentation

## 2018-11-01 DIAGNOSIS — R45851 Suicidal ideations: Secondary | ICD-10-CM | POA: Diagnosis not present

## 2018-11-01 DIAGNOSIS — Z79899 Other long term (current) drug therapy: Secondary | ICD-10-CM | POA: Diagnosis not present

## 2018-11-01 MED ORDER — ACETAMINOPHEN 325 MG PO TABS
650.0000 mg | ORAL_TABLET | ORAL | Status: DC | PRN
  Administered 2018-11-02: 650 mg via ORAL
  Filled 2018-11-01: qty 2

## 2018-11-01 NOTE — Progress Notes (Addendum)
CSW spoke to EDP who described pt's desire for medications, preferably for three months worth.  Per the pt she feels she has or will have difficulty obtaining medications at [facility] and states driving is an obstacle.   CSW confirmed that with Medicaid RN CM cannot provided meds and EDP confirmed EDP cannot prescribe meds and as such the RN CM may better clarify with the pt what resources are available to the pt by phone if this helps.  EPD who will place a consult for [facility] RN CM with an order for face-to-face and an order for medical assistance.  CSW called Triage for possible transportation needs and ED Triage checked wit pt who stated she "can Melburn Popper" if she needs to.  2nd shift ED CSW will leave handoff for 1st shift ED [facility] RN CM.  CSW will continue to follow for D/C needs.  Alphonse Guild. Hollyn Stucky, LCSW, LCAS, CSI Transitions of Care Clinical Social Worker Care Coordination Department Ph: [phone]

## 2018-11-01 NOTE — ED Triage Notes (Signed)
Patient reports being off her medication sue to not being able to afford it and transportation issues.   Patient picked up from dentist office by [employer].  Patient admits to wanting to harm herself today.    A/ox4 [employer] at bedside.

## 2018-11-01 NOTE — ED Provider Notes (Signed)
[facility]-EMERGENCY DEPT Provider Note   CSN: [id number] Arrival date & time: 11/01/18  1844     History   Chief Complaint Chief Complaint  Patient presents with  . Medical Clearance  . Suicidal    HPI Tamara [name] is a 23 y.o. female.     HPI 23 year old woman with history of bipolar disorder, PTSD comes in from her oral surgeon's clinic with SI.  Patient reports that she was with her dentist and they recommended that she get an oral surgery.  In order for her to get the surgery they recommended that she bring family as she would be getting anesthesia.  When patient tried to explain to them that she has no family or support system, they asked other questions regarding family support which upset the patient.  She ultimately informed him that she was stressed because of the questions that were being asked and the line of questioning is making her suicidal.  This is when the dentistry team called EMS and sent patient to the ER.  Patient reports that she has had some difficulty getting her medications.  She does not think she has serious suicidal thoughts and she just had a panic and anxiety attack.  She does not think she needs to be admitted.  She is requesting help getting her medications and also speaking with someone right now to get her mind "right".  Past Medical History:  Diagnosis Date  . ADHD   . Autism   . Bipolar disorder (HCC)   . Chronic back pain    "mostly lower; sometimes all over" (03/22/2017)  . Chronic stomach ulcer   . Depression   . GERD (gastroesophageal reflux disease)   . Headache    "a few/week" (03/22/2017)  . Migraine    "monthly recently" (03/22/2017)  . Personality disorder (HCC)   . PTSD (post-traumatic stress disorder)   . Seizures (HCC)    "very random; I lose memory of what happens; usually happens when I'm in bed; probably 2-3/year" (03/22/2017)    Patient Active Problem List   Diagnosis Date Noted  . 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 Bipolar 1 disorder (HCC) 08/25/2018  . MDD (major depressive disorder), single episode, severe with psychotic features (HCC) 07/27/2018  . Bipolar affective disorder, depressed, moderate (HCC) 07/09/2018  . Borderline personality disorder (HCC) 12/20/2016    History reviewed. No pertinent surgical history.   OB History   No obstetric history on file.      Home Medications    Prior to Admission medications   Medication Sig Start Date End Date Taking? Authorizing Provider  acetaminophen (TYLENOL) 500 MG tablet Take 2 tablets (1,000 mg total) by mouth every 8 (eight) hours as needed for moderate pain. 09/19/18   Law, Waylan BogaAlexandra M, PA-C  atorvastatin (LIPITOR) 20 MG tablet Take 20 mg by mouth at bedtime.     [provider]  FLUoxetine (PROZAC) 20 MG capsule Take 1 capsule (20 mg total) by mouth daily. For mood/anxiety 07/15/18   Aldean BakerSykes, Janet E, NP  hydrOXYzine (ATARAX/VISTARIL) 25 MG tablet Take 1 tablet (25 mg total) by mouth at bedtime. For anxiety 07/14/18   Aldean BakerSykes, Janet E, NP  JUNEL 1.5/30 1.5-30 MG-MCG tablet Take 1 tablet by mouth daily. 09/23/18   [provider]  Lurasidone HCl (LATUDA) 120 MG TABS Take 120 mg by mouth daily.    [provider]  Multiple Vitamin (MULTIVITAMIN WITH MINERALS) TABS tablet  Take 1 tablet by mouth daily.    [provider]  pantoprazole (PROTONIX) 40 MG tablet Take 1 tablet (40 mg total) by mouth daily. For reflux 07/15/18   Connye Burkitt, NP  prazosin (MINIPRESS) 5 MG capsule Take 5 mg by mouth at bedtime as needed. 07/06/18   [provider]  QUEtiapine (SEROQUEL) 200 MG tablet Take 1 tablet (200 mg total) by mouth at bedtime. For mood 07/14/18   Connye Burkitt, NP  traZODone (DESYREL) 100 MG tablet Take 1 tablet (100 mg total) by mouth at bedtime as needed for sleep. 07/14/18   Connye Burkitt, NP  Vitamin D, Ergocalciferol, (DRISDOL) 1.25  MG (50000 UT) CAPS capsule Take 1 capsule by mouth once a week. 07/06/18   [provider]  vortioxetine HBr (TRINTELLIX) 10 MG TABS tablet Take 10 mg by mouth daily.    [provider]    Family History Family History  Problem Relation Age of Onset  . Diabetes Mother   . Hypertension Maternal Grandmother   . Hyperlipidemia Maternal Grandmother   . Diabetes Maternal Grandmother     Social History Social History   Tobacco Use  . Smoking status: Never Smoker  . Smokeless tobacco: Never Used  Substance Use Topics  . Alcohol use: No    Alcohol/week: 0.0 standard drinks  . Drug use: No     Allergies   Asa [aspirin], Pork-derived products, and Lithium   Review of Systems Review of Systems  Constitutional: Positive for activity change.  Respiratory: Negative for shortness of breath.   Cardiovascular: Negative for chest pain.  Gastrointestinal: Negative for abdominal pain.  Psychiatric/Behavioral: Positive for suicidal ideas.     Physical Exam Updated Vital Signs BP (!) 130/94 (BP Location: Right Arm)   Pulse 90   Temp 98.3 F (36.8 C) (Oral)   Resp 18   SpO2 100%   Physical Exam Vitals signs and nursing note reviewed.  Constitutional:      Appearance: She is well-developed.  HENT:     Head: Normocephalic and atraumatic.  Neck:     Musculoskeletal: Normal range of motion and neck supple.  Cardiovascular:     Rate and Rhythm: Normal rate.  Pulmonary:     Effort: Pulmonary effort is normal.  Abdominal:     General: Bowel sounds are normal.  Skin:    General: Skin is warm and dry.  Neurological:     Mental Status: She is alert and oriented to person, place, and time.  Psychiatric:        Behavior: Behavior normal.        Thought Content: Thought content normal.      ED Treatments / Results  Labs (all labs ordered are listed, but only abnormal results are displayed) Labs Reviewed - No data to display  EKG None  Radiology No  results found.  Procedures Procedures (including critical care time)  Medications Ordered in ED Medications - No data to display   Initial Impression / Assessment and Plan / ED Course  I have reviewed the triage vital signs and the nursing notes.  Pertinent labs & imaging results that were available during my care of the patient were reviewed by me and considered in my medical decision making (see chart for details).        23 year old comes in a chief complaint of SI.  She essentially had an anxiety attack at the dentistry because the allegedly kept on insisting on getting some kind of  support available prior to a surgery that they are recommending.  Patient is calm and collected right now.  She is able to articulate her thoughts properly.  She has requested some help with medications and we will consult TTS as she also feels like she would benefit speaking with a counselor.  She does not need admission based on my evaluation.  Final Clinical Impressions(s) / ED Diagnoses   Final diagnoses:  Stress due to family tension    ED Discharge Orders    None       Derwood KaplanNanavati, Reily Treloar, MD 11/01/18 2108

## 2018-11-01 NOTE — Progress Notes (Addendum)
Consult request has been received. CSW reviewed chart and sees EDP consulted TTS.  CSW attempted call to EDP at this time.  CSW will continue to follow for D/C needs.  Alphonse Guild. Tavish Gettis, LCSW, LCAS, CSI Transitions of Care Clinical Social Worker Care Coordination Department Ph: [phone]
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
## 2018-11-02 NOTE — TOC Progression Note (Addendum)
Transition of Care [facility]) - Progression Note    Patient Details  Name: Tamara [name] MRN: [id number] Date of Birth: Feb 01, 1996  Transition of [facility]) CM/SW Contact  Erenest Rasher, RN Phone Number: [phone] 11/02/2018, 12:15 PM  Clinical Narrative:     Contacted pt's PCP, Dr Ayesha Rumpf to arrange follow up appt to have her meds refilled. Left HIPAA compliant message for pt to return call. Appt arranged for Sept 3, 2020 at 11:30 am. Pt will also need info on [employer]. Jonnie Finner RN CCM Case Mgmt phone [phone]  Contacted her pharmacy [facility] and they can let pt charge her copay up to $25, she currently has a balance of $12. Contacted pt to make aware and she will call pharmacy to have her psych meds refilled. States she has her other maintenance meds at home. Plans to pay balance once she get a new job. Has an interview tomorrow. States she receives food stamps $160 and her disability check is $745 and after she pays rent it is very minimal what she has left over to pay for meds. Explained her pharmacy can mail her Rx to her home if she does not have transportation to pharmacy. States she does know to use Medicaid transportation to her MD appt.  Cancelled PCP appt. Pt states she saw her PCP a few weeks ago. Has appt with her [facility] in October. She was assigned a therapist that she will see twice per month but therapist cancelled her last appt. [place] [place] ED TOC CM [phone]

## 2018-11-02 NOTE — Discharge Instructions (Addendum)
You should receive a follow-up call from social work regarding medications.

## 2019-04-10 ENCOUNTER — Emergency Department (HOSPITAL_COMMUNITY)
Admission: EM | Admit: 2019-04-10 | Discharge: 2019-04-10 | Disposition: A | Discharge status: Home / Self Care | Payer: Medicaid Other | Attending: Emergency Medicine | Admitting: Emergency Medicine

## 2019-04-10 ENCOUNTER — Emergency Department (HOSPITAL_COMMUNITY): Payer: Medicaid Other

## 2019-04-10 ENCOUNTER — Other Ambulatory Visit: Payer: Self-pay

## 2019-04-10 ENCOUNTER — Encounter (HOSPITAL_COMMUNITY): Payer: Self-pay | Admitting: Emergency Medicine

## 2019-04-10 DIAGNOSIS — R109 Unspecified abdominal pain: Secondary | ICD-10-CM | POA: Diagnosis present

## 2019-04-10 DIAGNOSIS — Z79899 Other long term (current) drug therapy: Secondary | ICD-10-CM | POA: Insufficient documentation

## 2019-04-10 DIAGNOSIS — F840 Autistic disorder: Secondary | ICD-10-CM | POA: Insufficient documentation

## 2019-04-10 DIAGNOSIS — R1084 Generalized abdominal pain: Secondary | ICD-10-CM | POA: Insufficient documentation

## 2019-04-10 DIAGNOSIS — F909 Attention-deficit hyperactivity disorder, unspecified type: Secondary | ICD-10-CM | POA: Diagnosis not present

## 2019-04-10 DIAGNOSIS — R350 Frequency of micturition: Secondary | ICD-10-CM | POA: Diagnosis not present

## 2019-04-10 LAB — COMPREHENSIVE METABOLIC PANEL
ALT: 18 U/L (ref 0–44)
AST: 16 U/L (ref 15–41)
Albumin: 3.7 g/dL (ref 3.5–5.0)
Alkaline Phosphatase: 88 U/L (ref 38–126)
Anion gap: 13 (ref 5–15)
BUN: 11 mg/dL (ref 6–20)
CO2: 23 mmol/L (ref 22–32)
Calcium: 8.8 mg/dL — ABNORMAL LOW (ref 8.9–10.3)
Chloride: 101 mmol/L (ref 98–111)
Creatinine, Ser: 0.83 mg/dL (ref 0.44–1.00)
GFR calc Af Amer: >60 mL/min (ref >60)
GFR calc non Af Amer: >60 mL/min (ref >60)
Glucose, Bld: 129 mg/dL — ABNORMAL HIGH (ref 70–99)
Potassium: 3.7 mmol/L (ref 3.5–5.1)
Sodium: 137 mmol/L (ref 135–145)
Total Bilirubin: 0.2 mg/dL — ABNORMAL LOW (ref 0.3–1.2)
Total Protein: 7.9 g/dL (ref 6.5–8.1)

## 2019-04-10 LAB — URINALYSIS, ROUTINE W REFLEX MICROSCOPIC
Bilirubin Urine: NEGATIVE
Glucose, UA: NEGATIVE mg/dL
Hgb urine dipstick: NEGATIVE
Ketones, ur: NEGATIVE mg/dL
Leukocytes,Ua: NEGATIVE
Nitrite: NEGATIVE
Protein, ur: 30 mg/dL — AB
Specific Gravity, Urine: 1.029 (ref 1.005–1.030)
pH: 5 (ref 5.0–8.0)

## 2019-04-10 LAB — CBC WITH DIFFERENTIAL/PLATELET
Abs Immature Granulocytes: 0.1 10*3/uL — ABNORMAL HIGH (ref 0.00–0.07)
Basophils Absolute: 0.1 10*3/uL (ref 0.0–0.1)
Basophils Relative: 0 %
Eosinophils Absolute: 0.1 10*3/uL (ref 0.0–0.5)
Eosinophils Relative: 1 %
HCT: 39.6 % (ref 36.0–46.0)
Hemoglobin: 12.5 g/dL (ref 12.0–15.0)
Immature Granulocytes: 1 %
Lymphocytes Relative: 12 %
Lymphs Abs: 2.3 10*3/uL (ref 0.7–4.0)
MCH: 27.1 pg (ref 26.0–34.0)
MCHC: 31.6 g/dL (ref 30.0–36.0)
MCV: 85.7 fL (ref 80.0–100.0)
Monocytes Absolute: 0.8 10*3/uL (ref 0.1–1.0)
Monocytes Relative: 4 %
Neutro Abs: 16.3 10*3/uL — ABNORMAL HIGH (ref 1.7–7.7)
Neutrophils Relative %: 82 %
Platelets: 448 10*3/uL — ABNORMAL HIGH (ref 150–400)
RBC: 4.62 MIL/uL (ref 3.87–5.11)
RDW: 14.1 % (ref 11.5–15.5)
WBC: 19.6 10*3/uL — ABNORMAL HIGH (ref 4.0–10.5)
nRBC: 0 % (ref 0.0–0.2)

## 2019-04-10 LAB — LIPASE, BLOOD: Lipase: 19 U/L (ref 11–51)

## 2019-04-10 LAB — I-STAT BETA HCG BLOOD, ED (MC, WL, AP ONLY): I-stat hCG, quantitative: <5 m[IU]/mL (ref <5)

## 2019-04-10 MED ORDER — ONDANSETRON 4 MG PO TBDP
4.0000 mg | ORAL_TABLET | Freq: Once | ORAL | Status: AC
  Administered 2019-04-10: 4 mg via ORAL
  Filled 2019-04-10: qty 1

## 2019-04-10 MED ORDER — DOCUSATE SODIUM 100 MG PO CAPS: 100.0000 mg | ORAL_CAPSULE | Freq: Two times a day (BID) | ORAL | 0 refills | Status: DC

## 2019-04-10 MED ORDER — CEPHALEXIN 500 MG PO CAPS: 500.0000 mg | ORAL_CAPSULE | Freq: Two times a day (BID) | ORAL | 0 refills | Status: DC

## 2019-04-10 MED ORDER — ONDANSETRON HCL 4 MG/2ML IJ SOLN
4.0000 mg | Freq: Once | INTRAMUSCULAR | Status: DC
  Filled 2019-04-10: qty 2

## 2019-04-10 MED ORDER — SODIUM CHLORIDE 0.9 % IV BOLUS: 1000.0000 mL | Freq: Once | INTRAVENOUS | Status: DC

## 2019-04-10 MED ORDER — ACETAMINOPHEN 325 MG PO TABS
650.0000 mg | ORAL_TABLET | Freq: Once | ORAL | Status: AC
  Administered 2019-04-10: 05:00:00 650 mg via ORAL
  Filled 2019-04-10: qty 2

## 2019-04-10 MED ORDER — CEPHALEXIN 500 MG PO CAPS
500.0000 mg | ORAL_CAPSULE | Freq: Once | ORAL | Status: AC
  Administered 2019-04-10: 06:00:00 500 mg via ORAL
  Filled 2019-04-10: qty 1

## 2019-04-10 MED ORDER — DOCUSATE SODIUM 100 MG PO CAPS
100.0000 mg | ORAL_CAPSULE | Freq: Once | ORAL | Status: AC
  Administered 2019-04-10: 06:00:00 100 mg via ORAL
  Filled 2019-04-10: qty 1

## 2019-04-10 NOTE — ED Triage Notes (Signed)
[facility] transported pt from home to [facility] ED and reports the following:  Pt called EMS because bilateral rib pain, nausea, and vomiting (twice). Constant, stabbing pain 8/10.

## 2019-04-10 NOTE — ED Provider Notes (Signed)
[facility] DEPT Provider Note   CSN: [id number] Arrival date & time: 04/10/19  0258     History Chief Complaint  Patient presents with  . Abdominal Pain    Tamara [name] is a 24 y.o. female.  The history is provided by the patient and medical records.    24 y.o. F with hx of ADHD, autism, chronic back pain, chronic abdominal pain, GERD, headaches, personality disorder, PTSD, presenting to the ED with back and abdominal pain.  Patient reports pain began tonight, not able to tell me when or what she was doing when it started.  States "it hurts all over" and unable to localize to any particular area or side.  She reports 2 episodes of emesis.  No diarrhea.  Has been eating/drinking well.  No fevers, cough, chest pain, or SOB.  Patient has extensive mental health history, has had GI evaluations for her chronic abdominal pain in the past and this is felt to be at least in part due to her mental issues.     Past Medical History:  Diagnosis Date  . ADHD   . Autism   . Bipolar disorder ([place])   . Chronic back pain    "mostly lower; sometimes all over" (03/22/2017)  . Chronic stomach ulcer   . Depression   . GERD (gastroesophageal reflux disease)   . Headache    "a few/week" (03/22/2017)  . Migraine    "monthly recently" (03/22/2017)  . Personality disorder ([place])   . PTSD (post-traumatic stress disorder)   . Seizures ([place])    "very random; I lose memory of what happens; usually happens when I'm in bed; probably 2-3/year" (03/22/2017)    Patient Active Problem List   Diagnosis Date Noted  . Major depressive disorder, recurrent, severe with psychotic features ([place]) 10/17/2018  . Bipolar 1 disorder ([place]) 08/25/2018  . MDD (major depressive disorder), single episode, severe with psychotic features ([place]) 07/27/2018  . Bipolar affective disorder, depressed, moderate ([place]) 07/09/2018  . Borderline personality disorder ([place]) 12/20/2016    History  reviewed. No pertinent surgical history.   OB History   No obstetric history on file.     Family History  Problem Relation Age of Onset  . Diabetes Mother   . Hypertension Maternal Grandmother   . Hyperlipidemia Maternal Grandmother   . Diabetes Maternal Grandmother     Social History   Tobacco Use  . Smoking status: Never Smoker  . Smokeless tobacco: Never Used  Substance Use Topics  . Alcohol use: No    Alcohol/week: 0.0 standard drinks  . Drug use: No    Home Medications Prior to Admission medications   Medication Sig Start Date End Date Taking? Authorizing Provider  acetaminophen (TYLENOL) 500 MG tablet Take 2 tablets (1,000 mg total) by mouth every 8 (eight) hours as needed for moderate pain. 09/19/18   Law, Bea Graff, PA-C  FLUoxetine (PROZAC) 20 MG capsule Take 1 capsule (20 mg total) by mouth daily. For mood/anxiety Patient not taking: Reported on 11/01/2018 07/15/18   Connye Burkitt, NP  hydrOXYzine (ATARAX/VISTARIL) 25 MG tablet Take 1 tablet (25 mg total) by mouth at bedtime. For anxiety 07/14/18   Connye Burkitt, NP  JUNEL 1.5/30 1.5-30 MG-MCG tablet Take 1 tablet by mouth daily. 09/23/18   [provider]  pantoprazole (PROTONIX) 40 MG tablet Take 1 tablet (40 mg total) by mouth daily. For reflux 07/15/18   Connye Burkitt, NP  QUEtiapine (SEROQUEL) 200 MG  tablet Take 1 tablet (200 mg total) by mouth at bedtime. For mood Patient not taking: Reported on 11/01/2018 07/14/18   Connye Burkitt, NP  traZODone (DESYREL) 100 MG tablet Take 1 tablet (100 mg total) by mouth at bedtime as needed for sleep. 07/14/18   Connye Burkitt, NP  Vitamin D, Ergocalciferol, (DRISDOL) 1.25 MG (50000 UT) CAPS capsule Take 1 capsule by mouth once a week. 07/06/18   [provider]    Allergies    Asa [aspirin], Pork-derived products, and Lithium  Review of Systems   Review of Systems  Gastrointestinal: Positive for abdominal pain, nausea and vomiting.  All other systems  reviewed and are negative.   Physical Exam Updated Vital Signs BP (!) 141/114   Pulse 75   Temp 98.7 F (37.1 C)   Ht _0  (1.549 m)   Wt 113.4 kg   SpO2 100%   BMI 47.24 kg/m   Physical Exam Vitals and nursing note reviewed.  Constitutional:      Appearance: She is well-developed.     Comments: obese  HENT:     Head: Normocephalic and atraumatic.  Eyes:     Conjunctiva/sclera: Conjunctivae normal.     Pupils: Pupils are equal, round, and reactive to light.  Cardiovascular:     Rate and Rhythm: Normal rate and regular rhythm.     Heart sounds: Normal heart sounds.  Pulmonary:     Effort: Pulmonary effort is normal.     Breath sounds: Normal breath sounds. No wheezing or rhonchi.  Abdominal:     General: Bowel sounds are normal.     Palpations: Abdomen is soft.     Comments: Abdomen is obese, soft, begins saying "ouch" even with stethoscope being placed on her abdomen in any location, when distracted there is no apparent pain with palpation, bowel sounds are normal  Musculoskeletal:        General: Normal range of motion.     Cervical back: Normal range of motion.  Skin:    General: Skin is warm and dry.  Neurological:     Mental Status: She is alert and oriented to person, place, and time.     ED Results / Procedures / Treatments   Labs (all labs ordered are listed, but only abnormal results are displayed) Labs Reviewed  CBC WITH DIFFERENTIAL/PLATELET - Abnormal; Notable for the following components:      Result Value   WBC 19.6 (*)    Platelets 448 (*)    Neutro Abs 16.3 (*)    Abs Immature Granulocytes 0.10 (*)    All other components within normal limits  COMPREHENSIVE METABOLIC PANEL - Abnormal; Notable for the following components:   Glucose, Bld 129 (*)    Calcium 8.8 (*)    Total Bilirubin 0.2 (*)    All other components within normal limits  URINALYSIS, ROUTINE W REFLEX MICROSCOPIC - Abnormal; Notable for the following components:   APPearance  HAZY (*)    Protein, ur 30 (*)    Bacteria, UA MANY (*)    All other components within normal limits  LIPASE, BLOOD  I-STAT BETA HCG BLOOD, ED ([facility], [facility], [facility] ONLY)    EKG None  Radiology DG ABD ACUTE 2+V W 1V CHEST  Result Date: 04/10/2019 CLINICAL DATA:  Abdominal pain. EXAM: DG ABDOMEN ACUTE W/ 1V CHEST COMPARISON:  03/23/2017 FINDINGS: The upright chest x-ray demonstrates normal cardiomediastinal contours. The lungs are clear. No pleural effusions. The abdominal bowel gas pattern  is unremarkable. No findings for small bowel obstruction or free air. No significant stool burden. The soft tissue shadows are maintained. No worrisome calcifications. The bony structures are intact. IMPRESSION: 1. No acute cardiopulmonary findings. 2. Unremarkable abdominal bowel gas pattern. Electronically Signed   By: Marijo Sanes M.D.   On: 04/10/2019 05:43    Procedures Procedures (including critical care time)  Medications Ordered in ED Medications  docusate sodium (COLACE) capsule 100 mg (has no administration in time range)  cephALEXin (KEFLEX) capsule 500 mg (has no administration in time range)  ondansetron (ZOFRAN-ODT) disintegrating tablet 4 mg (4 mg Oral Given 04/10/19 0443)  acetaminophen (TYLENOL) tablet 650 mg (650 mg Oral Given 04/10/19 0443)    ED Course  I have reviewed the triage vital signs and the nursing notes.  Pertinent labs & imaging results that were available during my care of the patient were reviewed by me and considered in my medical decision making (see chart for details).    MDM Rules/Calculators/A&P  24 year old female here with reported back and abdominal pain.  She has a history of same.  She reports "pain is all over".  She is afebrile and nontoxic in appearance.  Abdomen is obese but soft, she begins stating "ouch" when stethoscope is placed anywhere on her abdomen.  When distracted she does not have any focal tenderness or peritoneal signs.  She is not actively vomiting.   On chart review, history of chronic abdominal pain.  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  Will obtain screening labs, give p.o. Zofran.  4:57 AM Labs with leukocytosis of 19, however based on prior values, has a history of chronic leukocytosis with values ranging anywhere from 11K-18K routinely.  Normal LFT's, alk phos, bili, and lipase.   On recheck, patient is not had any further vomiting.  She is tolerating water.  Still complains of generalized pain states "I wonder if I am constipated?".  She does report feeling full and having small, hard stools yesterday.  She is passing flatus.  She does have a history of chronic constipation and denies being on any current stool softener.  Will obtain abdominal series.  If this is reassuring, suspect she can likely be discharged home.  Her UA does appear to have many bacteria, she does report frequent urination but denies any dysuria.  5:49 AM Acute abd series without any obstructive findings.  Patient is asking for stool softener.  During every evaluation/reassessment her complaints seem to change and exam findings are inconsistent.  Her vitals remain stable, no active vomiting here, and she does not appear to have a surgical abdomen at this time.  Will start keflex given UA findings and reported frequency, will also reinitiate stool softener as she does have history of chronic constipation.  Feel she is stable for discharge to follow-up with her GI physician along with PCP.  She may return here for any new/acute changes.  Final Clinical Impression(s) / ED Diagnoses Final diagnoses:  Generalized abdominal pain  Urinary frequency    Rx / DC Orders ED Discharge Orders         Ordered    cephALEXin (KEFLEX) 500 MG capsule  2 times daily     04/10/19 0551    docusate sodium (COLACE) 100 MG capsule  Every 12 hours     04/10/19 0551           Larene Pickett, PA-C  04/10/19 0556    Orpah Greek, MD 04/10/19 [phone]

## 2019-04-10 NOTE — Discharge Instructions (Addendum)
Take the prescribed medication as directed.  Recommend to drink lots of water and eat high fiber diet to help with any constipation symptoms. Follow-up with your primary care doctor.  Can also follow-up with your GI doctor for any ongoing abdominal issues. Return to the ED for new or worsening symptoms.

## 2019-04-12 ENCOUNTER — Encounter: Payer: Self-pay | Admitting: Gastroenterology

## 2019-04-12 ENCOUNTER — Other Ambulatory Visit: Payer: Self-pay

## 2019-04-12 ENCOUNTER — Other Ambulatory Visit (INDEPENDENT_AMBULATORY_CARE_PROVIDER_SITE_OTHER): Payer: Medicaid Other

## 2019-04-12 ENCOUNTER — Ambulatory Visit (INDEPENDENT_AMBULATORY_CARE_PROVIDER_SITE_OTHER): Payer: Medicaid Other | Admitting: Gastroenterology

## 2019-04-12 VITALS — BP 100/80 | HR 60 | Temp 97.9°F | Ht 63.0 in | Wt 292.0 lb

## 2019-04-12 DIAGNOSIS — R1084 Generalized abdominal pain: Secondary | ICD-10-CM | POA: Diagnosis not present

## 2019-04-12 DIAGNOSIS — D72829 Elevated white blood cell count, unspecified: Secondary | ICD-10-CM

## 2019-04-12 LAB — CBC WITH DIFFERENTIAL/PLATELET
Basophils Absolute: 0.2 10*3/uL — ABNORMAL HIGH (ref 0.0–0.1)
Basophils Relative: 1.3 % (ref 0.0–3.0)
Eosinophils Absolute: 0.5 10*3/uL (ref 0.0–0.7)
Eosinophils Relative: 2.9 % (ref 0.0–5.0)
HCT: 39.8 % (ref 36.0–46.0)
Hemoglobin: 13 g/dL (ref 12.0–15.0)
Lymphocytes Relative: 21.1 % (ref 12.0–46.0)
Lymphs Abs: 3.3 10*3/uL (ref 0.7–4.0)
MCHC: 32.6 g/dL (ref 30.0–36.0)
MCV: 83.5 fl (ref 78.0–100.0)
Monocytes Absolute: 0.9 10*3/uL (ref 0.1–1.0)
Monocytes Relative: 5.8 % (ref 3.0–12.0)
Neutro Abs: 10.6 10*3/uL — ABNORMAL HIGH (ref 1.4–7.7)
Neutrophils Relative %: 68.9 % (ref 43.0–77.0)
Platelets: 488 10*3/uL — ABNORMAL HIGH (ref 150.0–400.0)
RBC: 4.77 Mil/uL (ref 3.87–5.11)
RDW: 15 % (ref 11.5–15.5)
WBC: 15.4 10*3/uL — ABNORMAL HIGH (ref 4.0–10.5)
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
## 2019-04-12 NOTE — Patient Instructions (Signed)
If you are age 24 or older, your body mass index should be between 23-30. Your Body mass index is 51.73 kg/m. If this is out of the aforementioned range listed, please consider follow up with your Primary Care Provider.  If you are age 72 or younger, your body mass index should be between 19-25. Your Body mass index is 51.73 kg/m. If this is out of the aformentioned range listed, please consider follow up with your Primary Care Provider.   Your provider has requested that you go to the basement level for lab work before leaving today. Press "B" on the elevator. The lab is located at the first door on the left as you exit the elevator.  Due to recent changes in healthcare laws, you may see the results of your imaging and laboratory studies on MyChart before your provider has had a chance to review them.  We understand that in some cases there may be results that are confusing or concerning to you. Not all laboratory results come back in the same time frame and the provider may be waiting for multiple results in order to interpret others.  Please give [place] 48 hours in order for your provider to thoroughly review all the results before contacting the office for clarification of your results.   It was a pleasure to see you today!  Dr. Myrtie Neither

## 2019-06-10 ENCOUNTER — Ambulatory Visit: Payer: Medicaid Other

## 2019-06-30 ENCOUNTER — Encounter (HOSPITAL_COMMUNITY): Payer: Self-pay

## 2019-06-30 ENCOUNTER — Other Ambulatory Visit: Payer: Self-pay

## 2019-06-30 ENCOUNTER — Emergency Department (HOSPITAL_COMMUNITY)
Admission: EM | Admit: 2019-06-30 | Discharge: 2019-06-30 | Disposition: A | Discharge status: Home / Self Care | Payer: Medicaid Other | Attending: Emergency Medicine | Admitting: Emergency Medicine

## 2019-06-30 ENCOUNTER — Ambulatory Visit: Payer: Self-pay | Admitting: *Deleted

## 2019-06-30 DIAGNOSIS — Z79899 Other long term (current) drug therapy: Secondary | ICD-10-CM | POA: Diagnosis not present

## 2019-06-30 DIAGNOSIS — R5383 Other fatigue: Secondary | ICD-10-CM | POA: Insufficient documentation

## 2019-06-30 DIAGNOSIS — M791 Myalgia, unspecified site: Secondary | ICD-10-CM | POA: Insufficient documentation

## 2019-06-30 DIAGNOSIS — F840 Autistic disorder: Secondary | ICD-10-CM | POA: Insufficient documentation

## 2019-06-30 DIAGNOSIS — R519 Headache, unspecified: Secondary | ICD-10-CM | POA: Insufficient documentation

## 2019-06-30 DIAGNOSIS — R52 Pain, unspecified: Secondary | ICD-10-CM

## 2019-06-30 LAB — BASIC METABOLIC PANEL
Anion gap: 10 (ref 5–15)
BUN: 12 mg/dL (ref 6–20)
CO2: 22 mmol/L (ref 22–32)
Calcium: 8.9 mg/dL (ref 8.9–10.3)
Chloride: 109 mmol/L (ref 98–111)
Creatinine, Ser: 0.81 mg/dL (ref 0.44–1.00)
GFR calc Af Amer: >60 mL/min (ref >60)
GFR calc non Af Amer: >60 mL/min (ref >60)
Glucose, Bld: 90 mg/dL (ref 70–99)
Potassium: 4.1 mmol/L (ref 3.5–5.1)
Sodium: 141 mmol/L (ref 135–145)

## 2019-06-30 LAB — CBC WITH DIFFERENTIAL/PLATELET
Abs Immature Granulocytes: 0.05 10*3/uL (ref 0.00–0.07)
Basophils Absolute: 0 10*3/uL (ref 0.0–0.1)
Basophils Relative: 0 %
Eosinophils Absolute: 0.2 10*3/uL (ref 0.0–0.5)
Eosinophils Relative: 1 %
HCT: 40.5 % (ref 36.0–46.0)
Hemoglobin: 12.8 g/dL (ref 12.0–15.0)
Immature Granulocytes: 0 %
Lymphocytes Relative: 25 %
Lymphs Abs: 3.8 10*3/uL (ref 0.7–4.0)
MCH: 26.7 pg (ref 26.0–34.0)
MCHC: 31.6 g/dL (ref 30.0–36.0)
MCV: 84.4 fL (ref 80.0–100.0)
Monocytes Absolute: 0.9 10*3/uL (ref 0.1–1.0)
Monocytes Relative: 6 %
Neutro Abs: 10.1 10*3/uL — ABNORMAL HIGH (ref 1.7–7.7)
Neutrophils Relative %: 68 %
Platelets: 421 10*3/uL — ABNORMAL HIGH (ref 150–400)
RBC: 4.8 MIL/uL (ref 3.87–5.11)
RDW: 14.3 % (ref 11.5–15.5)
WBC: 15 10*3/uL — ABNORMAL HIGH (ref 4.0–10.5)
nRBC: 0 % (ref 0.0–0.2)

## 2019-06-30 MED ORDER — DEXAMETHASONE 4 MG PO TABS
10.0000 mg | ORAL_TABLET | Freq: Once | ORAL | Status: AC
  Administered 2019-06-30: 10 mg via ORAL
  Filled 2019-06-30: qty 2

## 2019-06-30 MED ORDER — ACETAMINOPHEN 325 MG PO TABS
650.0000 mg | ORAL_TABLET | Freq: Once | ORAL | Status: AC
  Administered 2019-06-30: 14:00:00 650 mg via ORAL
  Filled 2019-06-30: qty 2

## 2019-06-30 NOTE — ED Triage Notes (Signed)
Patient c/o body aches, fatigue, and a headache x 2 days. Patient states it is effecting her ability to work.

## 2019-06-30 NOTE — ED Provider Notes (Signed)
[facility]-EMERGENCY DEPT Provider Note   CSN: [id number] Arrival date & time: 06/30/19  1224     History Chief Complaint  Patient presents with  . Fatigue  . Headache  . Generalized Body Aches    Tamara [name] is a 24 y.o. female.  24 year old female with past medical history of ADHD, autism, bipolar disorder, migraines presents with complaint of fatigue, body aches, migraine for the past 2 days.  Patient states migraine is located the right side of her head and behind her left eye, typical of prior migraines, last similar migraine was 1 month ago, pain is intermittent, patient does not take anything for her headache.  Denies fevers, chills, cough, diarrhea, loss of smell or taste, urinary symptoms.  Denies unilateral weakness or numbness, changes in vision, speech, gait.  Patient denies contact with anyone who has had Covid, states that she lives alone, works from home and has her groceries delivered and does not leave her house.  Tamara [name] was evaluated in Emergency Department on 06/30/2019 for the symptoms described in the history of present illness. She was evaluated in the context of the global COVID-19 pandemic, which necessitated consideration that the patient might be at risk for infection with the SARS-CoV-2 virus that causes COVID-19. Institutional protocols and algorithms that pertain to the evaluation of patients at risk for COVID-19 are in a state of rapid change based on information released by regulatory bodies including the CDC and federal and state organizations. These policies and algorithms were followed during the patient's care in the ED.         Past Medical History:  Diagnosis Date  . ADHD   . Autism   . Bipolar disorder (HCC)   . Chronic back pain    "mostly lower; sometimes all over" (03/22/2017)  . Chronic stomach ulcer   . Depression   . GERD (gastroesophageal reflux disease)   . Headache    "a few/week"  (03/22/2017)  . Migraine    "monthly recently" (03/22/2017)  . Personality disorder (HCC)   . PTSD (post-traumatic stress disorder)   . Seizures (HCC)    "very random; I lose memory of what happens; usually happens when I'm in bed; probably 2-3/year" (03/22/2017)    Patient Active Problem List   Diagnosis Date Noted  . Major depressive disorder, recurrent, severe with psychotic features (HCC) 10/17/2018  . Bipolar 1 disorder (HCC) 08/25/2018  . MDD (major depressive disorder), single episode, severe with psychotic features (HCC) 07/27/2018  . Bipolar affective disorder, depressed, moderate (HCC) 07/09/2018  . Borderline personality disorder (HCC) 12/20/2016    History reviewed. No pertinent surgical history.   OB History   No obstetric history on file.     Family History  Problem Relation Age of Onset  . Diabetes Mother   . Hypertension Maternal Grandmother   . Hyperlipidemia Maternal Grandmother   . Diabetes Maternal Grandmother     Social History   Tobacco Use  . Smoking status: Never Smoker  . Smokeless tobacco: Never Used  Substance Use Topics  . Alcohol use: No    Alcohol/week: 0.0 standard drinks  . Drug use: No    Home Medications Prior to Admission medications   Medication Sig Start Date End Date Taking? Authorizing Provider  acetaminophen (TYLENOL) 500 MG tablet Take 2 tablets (1,000 mg total) by mouth every 8 (eight) hours as needed for moderate pain. 09/19/18   Law, Waylan Boga, PA-C  FLUoxetine (PROZAC) 20 MG capsule Take  1 capsule (20 mg total) by mouth daily. For mood/anxiety 07/15/18   Connye Burkitt, NP  hydrOXYzine (ATARAX/VISTARIL) 25 MG tablet Take 1 tablet (25 mg total) by mouth at bedtime. For anxiety 07/14/18   Connye Burkitt, NP  JUNEL 1.5/30 1.5-30 MG-MCG tablet Take 1 tablet by mouth daily. 09/23/18   [provider]  pantoprazole (PROTONIX) 40 MG tablet Take 1 tablet (40 mg total) by mouth daily. For reflux 07/15/18   Connye Burkitt, NP     Allergies    Asa [aspirin], Pork-derived products, and Lithium  Review of Systems   Review of Systems  Constitutional: Positive for fatigue. Negative for chills, diaphoresis and fever.  Eyes: Negative for visual disturbance.  Respiratory: Negative for cough and shortness of breath.   Cardiovascular: Negative for chest pain.  Gastrointestinal: Negative for abdominal pain, nausea and vomiting.  Musculoskeletal: Positive for myalgias. Negative for arthralgias, gait problem and joint swelling.  Skin: Negative for rash and wound.  Allergic/Immunologic: Negative for immunocompromised state.  Neurological: Positive for headaches. Negative for dizziness, speech difficulty and weakness.  All other systems reviewed and are negative.   Physical Exam Updated Vital Signs BP (!) 156/93 (BP Location: Right Arm)   Pulse (!) 109   Temp 97.9 F (36.6 C) (Oral)   Resp 18   Ht 5\' 1"  (1.549 m)   Wt 135 kg   SpO2 98%   BMI 56.23 kg/m   Physical Exam Vitals and nursing note reviewed.  Constitutional:      General: She is not in acute distress.    Appearance: She is well-developed. She is obese. She is not diaphoretic.  HENT:     Head: Normocephalic and atraumatic.     Mouth/Throat:     Mouth: Mucous membranes are moist.  Eyes:     Extraocular Movements: Extraocular movements intact.     Right eye: Normal extraocular motion.     Left eye: Normal extraocular motion.     Pupils: Pupils are equal, round, and reactive to light.  Cardiovascular:     Heart sounds: Normal heart sounds.  Pulmonary:     Effort: Pulmonary effort is normal.     Breath sounds: Normal breath sounds.  Skin:    General: Skin is warm and dry.     Findings: No rash.  Neurological:     Mental Status: She is alert and oriented to person, place, and time.     GCS: GCS eye subscore is 4. GCS verbal subscore is 5. GCS motor subscore is 6.     Cranial Nerves: No cranial nerve deficit or facial asymmetry.     Sensory: No  sensory deficit.  Psychiatric:        Behavior: Behavior normal.     ED Results / Procedures / Treatments   Labs (all labs ordered are listed, but only abnormal results are displayed) Labs Reviewed  CBC WITH DIFFERENTIAL/PLATELET - Abnormal; Notable for the following components:      Result Value   WBC 15.0 (*)    Platelets 421 (*)    Neutro Abs 10.1 (*)    All other components within normal limits  BASIC METABOLIC PANEL    EKG None  Radiology No results found.  Procedures Procedures (including critical care time)  Medications Ordered in ED Medications  acetaminophen (TYLENOL) tablet 650 mg (650 mg Oral Given 06/30/19 1419)  dexamethasone (DECADRON) tablet 10 mg (10 mg Oral Given 06/30/19 1419)    ED Course  I  have reviewed the triage vital signs and the nursing notes.  Pertinent labs & imaging results that were available during my care of the patient were reviewed by me and considered in my medical decision making (see chart for details).  Clinical Course as of Jun 30 1506  Fri Jun 30, 2019  3223 24 year old female presents with complaint of migraine, fatigue, body aches for the past 3 days.  Recommend and offered Covid testing, patient refuses, states that she does not leave her house and cannot possibly have Covid, advised patient that we have seen patients who do not leave the home contract COVID-19.  CBC with elevated white count 15,000 with increase in neutrophils, similar to prior, recommend follow-up with PCP.  BMP pending.  Patient was given Tylenol and Decadron for her headache, she has an anaphylactic reaction to aspirin.   [LM]    Clinical Course User Index [LM] Alden Hipp   MDM Rules/Calculators/A&P                      Final Clinical Impression(s) / ED Diagnoses Final diagnoses:  Fatigue, unspecified type  Nonintractable episodic headache, unspecified headache type  Generalized body aches    Rx / DC Orders ED Discharge Orders    None        Jeannie Fend, PA-C 06/30/19 1508    Pricilla Loveless, MD 07/04/19 [phone]

## 2019-06-30 NOTE — Telephone Encounter (Signed)
Three attempts have been made to contact her without success.   Three separate messages have been left for her to call us back.

## 2019-06-30 NOTE — Telephone Encounter (Signed)
Summary: Clinical Advice   Patient experiencing migraines and tiredness, patient seeking clinical advice. (Patient states her doctor office is closed today)      Second call attempt for patient- left message to call back.

## 2019-06-30 NOTE — Discharge Instructions (Addendum)
Your labs today do not show any changes from prior.  Recommend follow-up with your doctor if your symptoms continue.  Recommend Covid testing, you have declined this test today, you can have this done outpatient if you prefer.

## 2020-11-23 ENCOUNTER — Emergency Department (HOSPITAL_COMMUNITY)
Admission: EM | Admit: 2020-11-23 | Discharge: 2020-11-23 | Disposition: A | Discharge status: Home / Self Care | Payer: Medicaid Other | Attending: Emergency Medicine | Admitting: Emergency Medicine

## 2020-11-23 ENCOUNTER — Encounter (HOSPITAL_COMMUNITY): Payer: Self-pay | Admitting: Emergency Medicine

## 2020-11-23 DIAGNOSIS — F840 Autistic disorder: Secondary | ICD-10-CM | POA: Insufficient documentation

## 2020-11-23 DIAGNOSIS — M79671 Pain in right foot: Secondary | ICD-10-CM | POA: Diagnosis present

## 2020-11-23 DIAGNOSIS — M722 Plantar fascial fibromatosis: Secondary | ICD-10-CM | POA: Diagnosis not present

## 2020-11-23 MED ORDER — KETOROLAC TROMETHAMINE 30 MG/ML IJ SOLN
30.0000 mg | Freq: Once | INTRAMUSCULAR | Status: AC
  Administered 2020-11-23: 30 mg via INTRAMUSCULAR
  Filled 2020-11-23: qty 1

## 2020-11-23 NOTE — ED Triage Notes (Addendum)
Per EMS, patient from home, bilateral foot pain radiating to knees x5 days. Denies injury.  States podiatry dx her with plantar fasciitis. Reports taking naproxen without relief.

## 2020-11-23 NOTE — Discharge Instructions (Signed)
Please consider doing daily stretching, as well as wearing night splints on to help ease the pain from plantar fasciitis.  Follow-up closely with your podiatrist for further managements of your condition.

## 2020-11-23 NOTE — ED Provider Notes (Signed)
[facility]-EMERGENCY DEPT Provider Note   CSN: [id number] Arrival date & time: 11/23/20  1203     History Chief Complaint  Patient presents with   Foot Pain    Tamara [name] is a 25 y.o. female.  The history is provided by the patient and medical records. No language interpreter was used.  Foot Pain   25 year old female significant history of personality disorder, bipolar, autism, seizures brought here via EMS from home with complaints of foot pain.  Patient reports she has been diagnosed with plantar fasciitis by her podiatrist due to her ongoing bilateral foot pain which has been persistent for many years.  Despite taking multiple over-the-counter medication as well as medication prescribed by her doctor she report progressive worsening pain to both feet.  Pain is to the sole of her feet, sharp stabbing, radiates up her leg worse in the morning and with ambulation.  Today she was having trouble walking because of the intensity of the pain.  She denies any associated fever numbness or recent injury.  She was last evaluated by her podiatrist less than a month ago.  Past Medical History:  Diagnosis Date   ADHD    Autism    Bipolar disorder (HCC)    Chronic back pain    "mostly lower; sometimes all over" (03/22/2017)   Chronic stomach ulcer    Depression    GERD (gastroesophageal reflux disease)    Headache    "a few/week" (03/22/2017)   Migraine    "monthly recently" (03/22/2017)   Personality disorder (HCC)    PTSD (post-traumatic stress disorder)    Seizures (HCC)    "very random; I lose memory of what happens; usually happens when I'm in bed; probably 2-3/year" (03/22/2017)    Patient Active Problem List   Diagnosis Date Noted   Major depressive disorder, recurrent, severe with psychotic features (HCC) 10/17/2018   Bipolar 1 disorder (HCC) 08/25/2018   MDD (major depressive disorder), single episode, severe with psychotic features (HCC)  07/27/2018   Bipolar affective disorder, depressed, moderate (HCC) 07/09/2018   Borderline personality disorder (HCC) 12/20/2016    History reviewed. No pertinent surgical history.   OB History   No obstetric history on file.     Family History  Problem Relation Age of Onset   Diabetes Mother    Hypertension Maternal Grandmother    Hyperlipidemia Maternal Grandmother    Diabetes Maternal Grandmother     Social History   Tobacco Use   Smoking status: Never   Smokeless tobacco: Never  Vaping Use   Vaping Use: Never used  Substance Use Topics   Alcohol use: No    Alcohol/week: 0.0 standard drinks   Drug use: No    Home Medications Prior to Admission medications   Medication Sig Start Date End Date Taking? Authorizing Provider  acetaminophen (TYLENOL) 500 MG tablet Take 2 tablets (1,000 mg total) by mouth every 8 (eight) hours as needed for moderate pain. 09/19/18   Law, Waylan Boga, PA-C  FLUoxetine (PROZAC) 20 MG capsule Take 1 capsule (20 mg total) by mouth daily. For mood/anxiety 07/15/18   Aldean Baker, NP  hydrOXYzine (ATARAX/VISTARIL) 25 MG tablet Take 1 tablet (25 mg total) by mouth at bedtime. For anxiety 07/14/18   Aldean Baker, NP  JUNEL 1.5/30 1.5-30 MG-MCG tablet Take 1 tablet by mouth daily. 09/23/18   [provider]  pantoprazole (PROTONIX) 40 MG tablet Take 1 tablet (40 mg total) by mouth daily. For  reflux 07/15/18   Aldean Baker, NP    Allergies    Asa [aspirin], Pork-derived products, and Lithium  Review of Systems   Review of Systems  Constitutional:  Negative for fever.  Musculoskeletal:  Positive for arthralgias.  Skin:  Negative for wound.  Neurological:  Negative for numbness.   Physical Exam Updated Vital Signs BP (!) 141/82   Pulse 85   Temp 98.1 F (36.7 C)   Resp 20   SpO2 98%   Physical Exam Vitals and nursing note reviewed.  Constitutional:      General: She is not in acute distress.    Appearance: She is  well-developed. She is obese.     Comments: Morbidly obese  HENT:     Head: Atraumatic.  Eyes:     Conjunctiva/sclera: Conjunctivae normal.  Pulmonary:     Effort: Pulmonary effort is normal.  Musculoskeletal:        General: Tenderness (Exquisite tenderness to the soles of bilateral feet without any swelling erythema or edema or warmth noted.  Intact dorsalis pedis pulse with brisk cap refill.) present.     Cervical back: Neck supple.  Skin:    Findings: No rash.  Neurological:     Mental Status: She is alert.  Psychiatric:        Mood and Affect: Mood normal.    ED Results / Procedures / Treatments   Labs (all labs ordered are listed, but only abnormal results are displayed) Labs Reviewed - No data to display  EKG None  Radiology No results found.  Procedures Procedures   Medications Ordered in ED Medications - No data to display  ED Course  I have reviewed the triage vital signs and the nursing notes.  Pertinent labs & imaging results that were available during my care of the patient were reviewed by me and considered in my medical decision making (see chart for details).    MDM Rules/Calculators/A&P                           BP (!) 141/82   Pulse 85   Temp 98.1 F (36.7 C)   Resp 20   SpO2 98%   Final Clinical Impression(s) / ED Diagnoses Final diagnoses:  Plantar fasciitis    Rx / DC Orders ED Discharge Orders     None      2:48 PM Patient here with bilateral foot pain ongoing for several years but worsened in the past year and a half.  Reportedly was diagnosed with plantar fasciitis by her podiatrist but states medication prescribed did not provide adequate relief prompting this ER visit.  Exam revealed tenderness to the sole of bilateral feet consistent with plantar fasciitis.  No evidence of cellulitis.  Low suspicion for DVT and low suspicion for any fracture or dislocation.  Moderate amount of time was spent discussion appropriate stretching  exercise as well as using night splints to help with symptoms.  Encourage patient to follow-up with her podiatrist for further care.   Fayrene Helper, PA-C 11/23/20 1454    Bethann Berkshire, MD 11/25/20 [phone]

## 2020-12-05 IMAGING — CT CT HEAD WITHOUT CONTRAST
4 series · 16 of 47 positions shown, 18 images · non-contrast
Comparison: CT brain 09/19/2018

CLINICAL DATA: Altered LOC

EXAM:
CT HEAD WITHOUT CONTRAST
TECHNIQUE: Contiguous axial images were obtained from the base of the skull
through the vertex without intravenous contrast.

[Series 3: head without · axial · non-contrast · 0.41mm/px · z∈[-115,+5]mm · 7 of 34 slices shown, 9 images]
[im 5/34  brain]
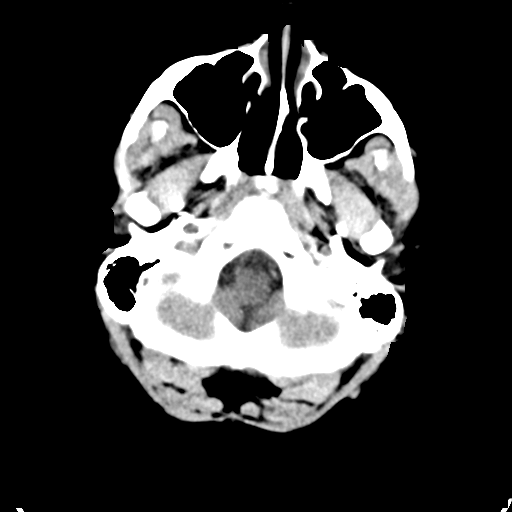
[im 5/34  bone]
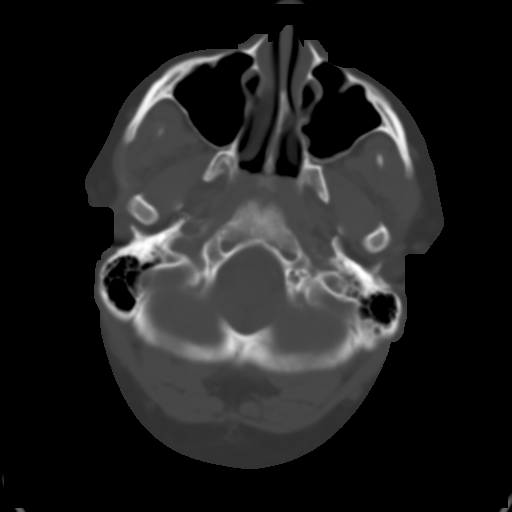
[im 9/34  brain]
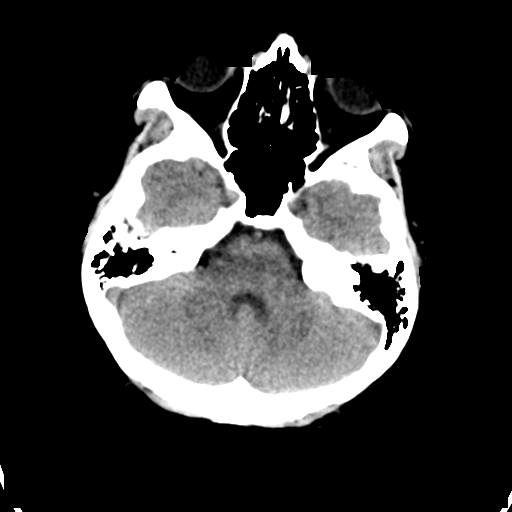
[im 13/34  brain]
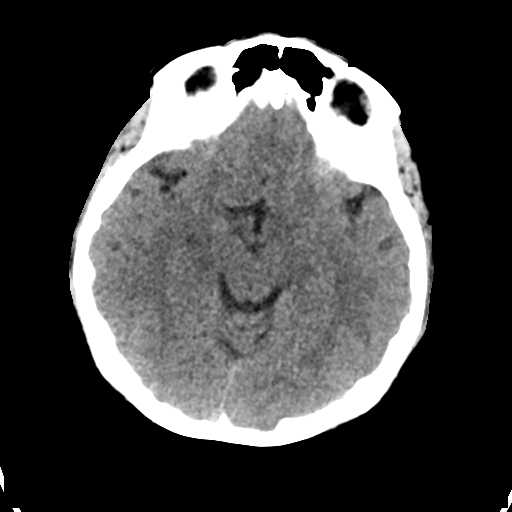
[im 17/34  brain]
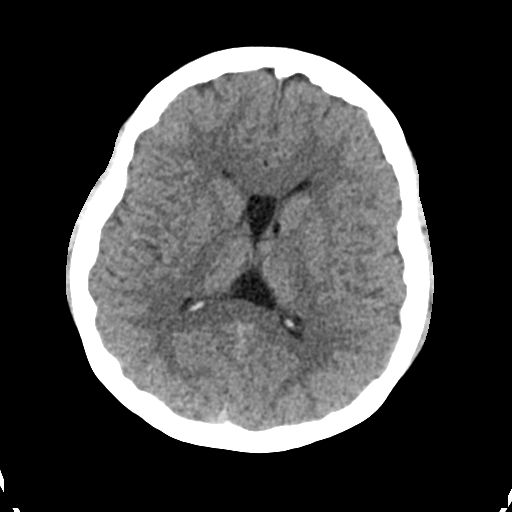
[im 21/34  brain]
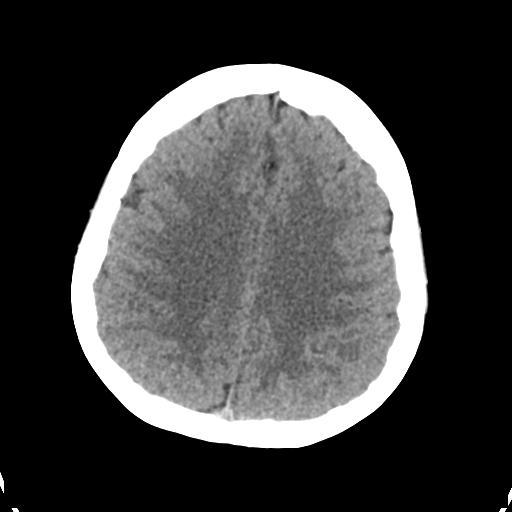
[im 21/34  bone]
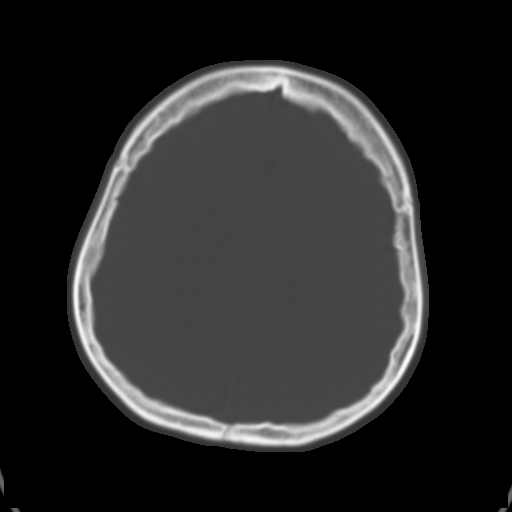
[im 25/34  brain]
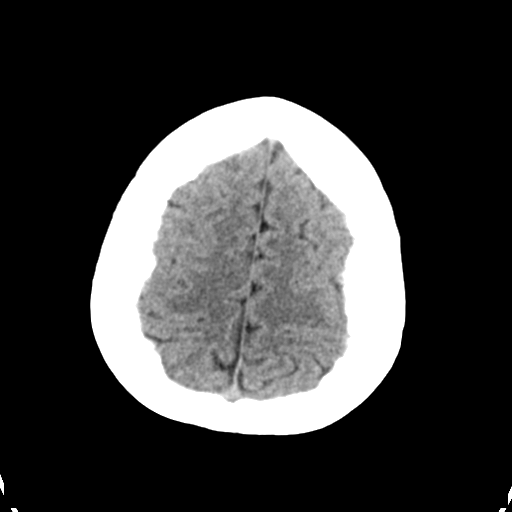
[im 29/34  brain]
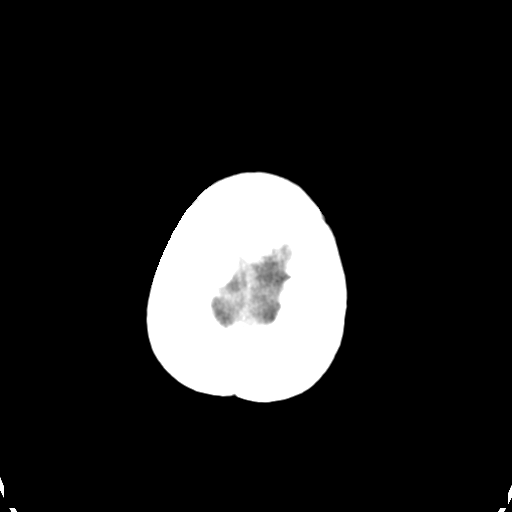

[Series 4: head bone · axial · 0.41mm/px · z∈[-119,-87]mm · 3 of 84 slices shown]
[im 9/84  bone]
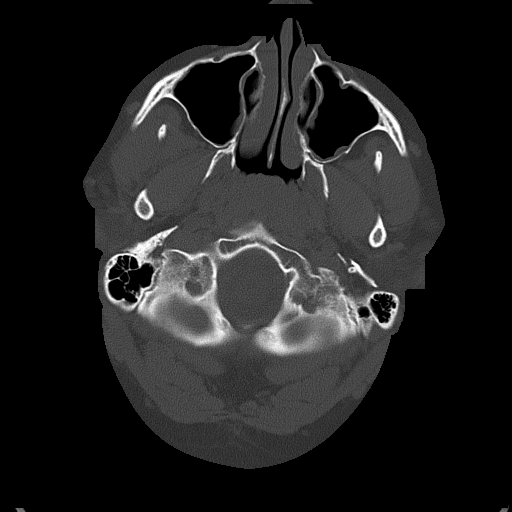
[im 17/84  bone]
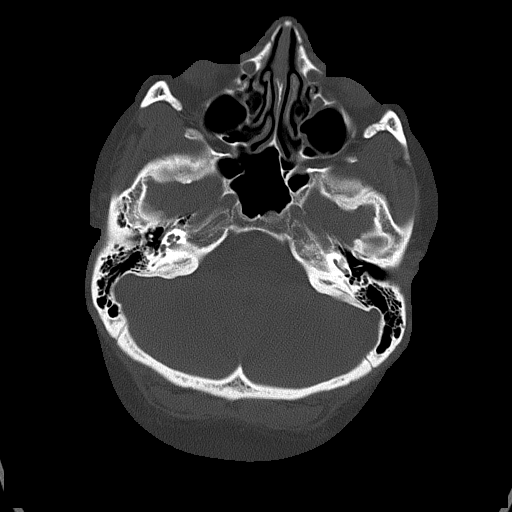
[im 25/84  bone]
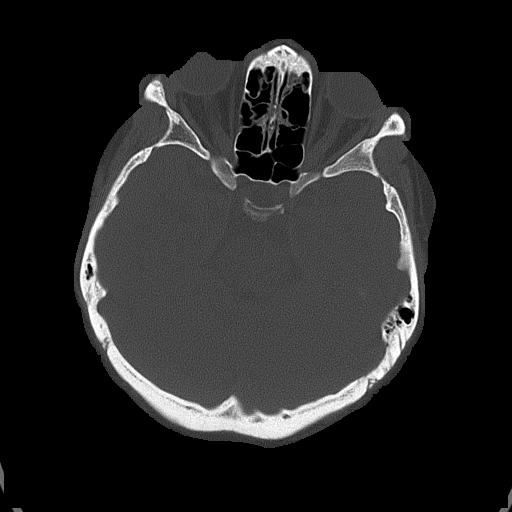

[Series 5: head without cor · coronal · non-contrast · 0.33mm/px · 3 of 67 slices shown]
[im 23/67  brain]
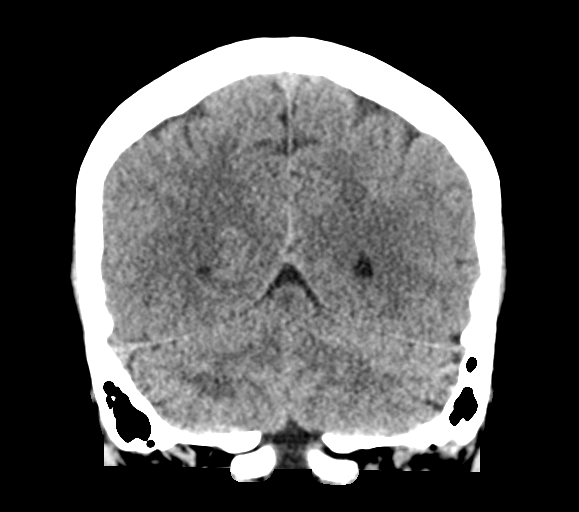
[im 30/67  brain]
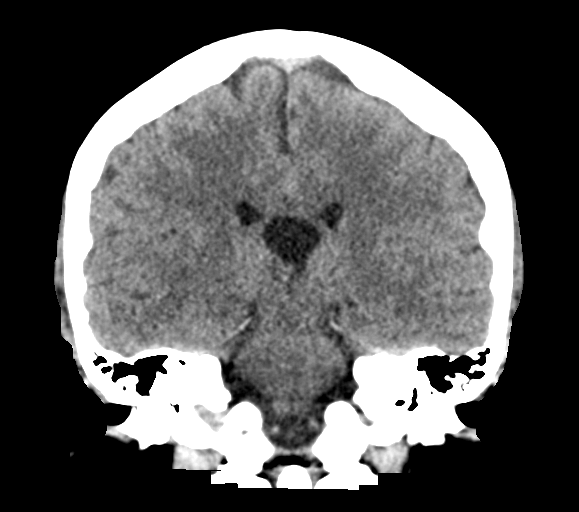
[im 37/67  brain]
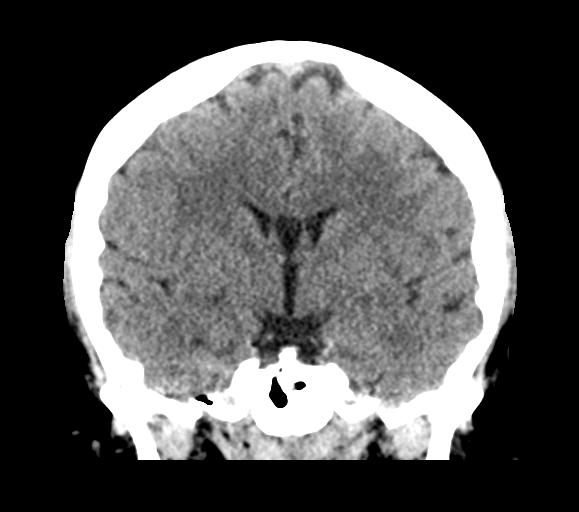

[Series 6: head without sag · sagittal · non-contrast · 0.33mm/px · 3 of 62 slices shown]
[im 21/62  brain]
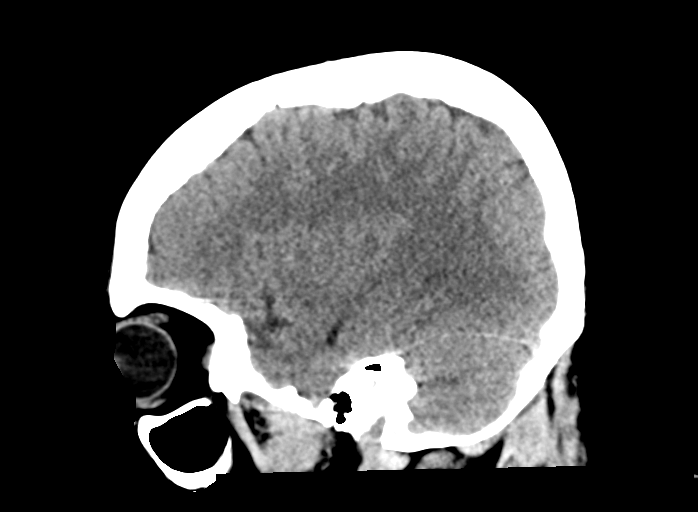
[im 31/62  brain]
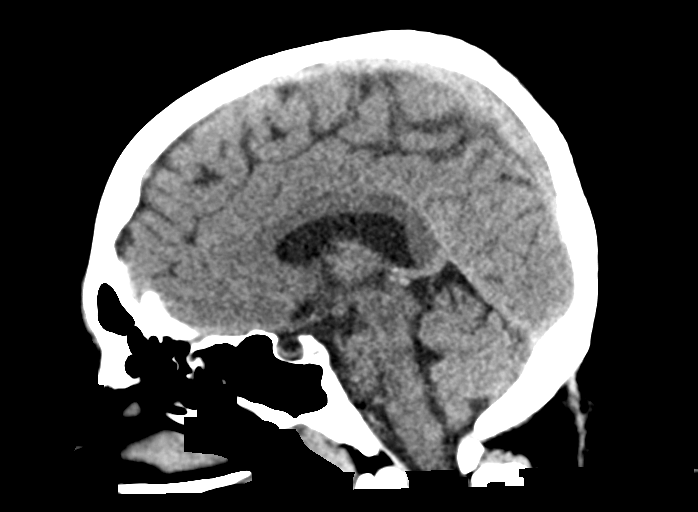
[im 41/62  brain]
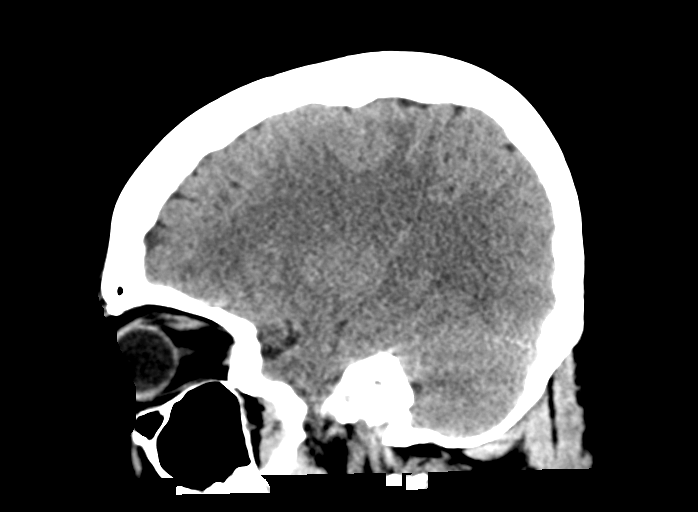

[16 of 47 positions shown; findings below may reference images not displayed]

FINDINGS: Brain: No acute territorial infarction, hemorrhage or intracranial
mass. Ventricles are nonenlarged.

Vascular: No hyperdense vessel or unexpected calcification.

Skull: Normal. Negative for fracture or focal lesion.

Sinuses/Orbits: Mucosal thickening in the maxillary ethmoid and
frontal sinuses

Other: None
IMPRESSION: Negative non contrasted CT appearance of the brain

## 2020-12-10 ENCOUNTER — Emergency Department (HOSPITAL_COMMUNITY)
Admission: EM | Admit: 2020-12-10 | Discharge: 2020-12-10 | Disposition: A | Discharge status: Home / Self Care | Payer: Medicaid Other | Attending: Emergency Medicine | Admitting: Emergency Medicine

## 2020-12-10 ENCOUNTER — Other Ambulatory Visit: Payer: Self-pay

## 2020-12-10 DIAGNOSIS — H9201 Otalgia, right ear: Secondary | ICD-10-CM | POA: Diagnosis present

## 2020-12-10 DIAGNOSIS — Z79899 Other long term (current) drug therapy: Secondary | ICD-10-CM | POA: Insufficient documentation

## 2020-12-10 DIAGNOSIS — F840 Autistic disorder: Secondary | ICD-10-CM | POA: Diagnosis not present

## 2020-12-10 MED ORDER — NAPROXEN 500 MG PO TABS: 500.0000 mg | ORAL_TABLET | Freq: Two times a day (BID) | ORAL | 0 refills | Status: DC | PRN

## 2020-12-10 MED ORDER — OXYCODONE-ACETAMINOPHEN 5-325 MG PO TABS
1.0000 | ORAL_TABLET | Freq: Once | ORAL | Status: AC
Start: 2020-12-10 — End: 2020-12-10
  Administered 2020-12-10: 1 via ORAL
  Filled 2020-12-10: qty 1

## 2020-12-10 NOTE — Discharge Instructions (Addendum)
Recommend trying different headset or taking a rest for a couple days from the daily headset use.  Recommend following up with your primary care doctor, he can also try following up with ENT.  If you develop swelling, redness, drainage, fever, come back to ER for reassessment.  Recommend taking anti-inflammatories and Tylenol as needed for pain.

## 2020-12-10 NOTE — ED Triage Notes (Signed)
Complains of R ear pain and tenderness w/ movement x4 days . TM intact, wax  noted to ear. States she wears cheap foam headphones all day at work and her ears are very sensitive.

## 2020-12-11 NOTE — ED Provider Notes (Signed)
[facility]-EMERGENCY DEPT Provider Note   CSN: [id number] Arrival date & time: 12/10/20  1721     History Chief Complaint  Patient presents with   Otalgia    Tamara [name] is a 25 y.o. female.  Presents to ER with concern for ear pain.  States that she has had ear pain previously but this current flare of pain has been ongoing for the last few days.  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  Denies fevers chills, no swelling or redness.  Denies any chronic medical problems.  HPI     Past Medical History:  Diagnosis Date   ADHD    Autism    Bipolar disorder (HCC)    Chronic back pain    "mostly lower; sometimes all over" (03/22/2017)   Chronic stomach ulcer    Depression    GERD (gastroesophageal reflux disease)    Headache    "a few/week" (03/22/2017)   Migraine    "monthly recently" (03/22/2017)   Personality disorder (HCC)    PTSD (post-traumatic stress disorder)    Seizures (HCC)    "very random; I lose memory of what happens; usually happens when I'm in bed; probably 2-3/year" (03/22/2017)    Patient Active Problem List   Diagnosis Date Noted   Major depressive disorder, recurrent, severe with psychotic features (HCC) 10/17/2018   Bipolar 1 disorder (HCC) 08/25/2018   MDD (major depressive disorder), single episode, severe with psychotic features (HCC) 07/27/2018   Bipolar affective disorder, depressed, moderate (HCC) 07/09/2018   Borderline personality disorder (HCC) 12/20/2016    No past surgical history on file.   OB History   No obstetric history on file.     Family History  Problem Relation Age of Onset   Diabetes Mother    Hypertension Maternal Grandmother    Hyperlipidemia Maternal Grandmother    Diabetes Maternal Grandmother     Social History   Tobacco Use   Smoking status: Never   Smokeless tobacco: Never  Vaping Use   Vaping Use: Never used   Substance Use Topics   Alcohol use: No    Alcohol/week: 0.0 standard drinks   Drug use: No    Home Medications Prior to Admission medications   Medication Sig Start Date End Date Taking? Authorizing Provider  naproxen (NAPROSYN) 500 MG tablet Take 1 tablet (500 mg total) by mouth 2 (two) times daily as needed. 12/10/20  Yes Milagros Loll, MD  acetaminophen (TYLENOL) 500 MG tablet Take 2 tablets (1,000 mg total) by mouth every 8 (eight) hours as needed for moderate pain. 09/19/18   Law, Waylan Boga, PA-C  FLUoxetine (PROZAC) 20 MG capsule Take 1 capsule (20 mg total) by mouth daily. For mood/anxiety 07/15/18   Aldean Baker, NP  hydrOXYzine (ATARAX/VISTARIL) 25 MG tablet Take 1 tablet (25 mg total) by mouth at bedtime. For anxiety 07/14/18   Aldean Baker, NP  JUNEL 1.5/30 1.5-30 MG-MCG tablet Take 1 tablet by mouth daily. 09/23/18   [provider]  pantoprazole (PROTONIX) 40 MG tablet Take 1 tablet (40 mg total) by mouth daily. For reflux 07/15/18   Aldean Baker, NP    Allergies    Asa [aspirin], Pork-derived products, and Lithium  Review of Systems   Review of Systems  HENT:  Positive for ear pain.   All other systems reviewed and are negative.  Physical Exam Updated Vital Signs BP Marland Kitchen)  143/98 (BP Location: Right Wrist)   Pulse 99   Temp 98.1 F (36.7 C) (Oral)   Resp 19   SpO2 96%   Physical Exam Vitals and nursing note reviewed.  Constitutional:      General: She is not in acute distress.    Appearance: She is well-developed.  HENT:     Head: Normocephalic and atraumatic.     Right Ear: Tympanic membrane, ear canal and external ear normal.     Left Ear: Tympanic membrane, ear canal and external ear normal.     Ears:     Comments: No TTP over mastoid process b/l Eyes:     Conjunctiva/sclera: Conjunctivae normal.  Cardiovascular:     Rate and Rhythm: Normal rate and regular rhythm.     Heart sounds: No murmur heard. Pulmonary:     Effort: Pulmonary  effort is normal. No respiratory distress.  Musculoskeletal:     Cervical back: Neck supple.  Skin:    General: Skin is warm and dry.  Neurological:     General: No focal deficit present.     Mental Status: She is alert.  Psychiatric:        Mood and Affect: Mood normal.    ED Results / Procedures / Treatments   Labs (all labs ordered are listed, but only abnormal results are displayed) Labs Reviewed - No data to display  EKG None  Radiology No results found.  Procedures Procedures   Medications Ordered in ED Medications  oxyCODONE-acetaminophen (PERCOCET/ROXICET) 5-325 MG per tablet 1 tablet (1 tablet Oral Given 12/10/20 1829)    ED Course  I have reviewed the triage vital signs and the nursing notes.  Pertinent labs & imaging results that were available during my care of the patient were reviewed by me and considered in my medical decision making (see chart for details).    MDM Rules/Calculators/A&P                           25 year old presents to ER with concern for right ear pain.  Her ear appears normal, there is no evidence for trauma, infection at present.  No tenderness over mastoid process, no overlying erythema, TM intact, not erythematous, no purulence.  Patient has stable vital signs.  May be related to frequent headset use.  Recommend following up with her primary doctor or ENT.  Recommended NSAIDs as needed.  After the discussed management above, the patient was determined to be safe for discharge.  The patient was in agreement with this plan and all questions regarding their care were answered.  ED return precautions were discussed and the patient will return to the ED with any significant worsening of condition.  Final Clinical Impression(s) / ED Diagnoses Final diagnoses:  Otalgia, right    Rx / DC Orders ED Discharge Orders          Ordered    naproxen (NAPROSYN) 500 MG tablet  2 times daily PRN        12/10/20 1821             Milagros Loll, MD 12/12/20 1110

## 2020-12-26 ENCOUNTER — Ambulatory Visit (INDEPENDENT_AMBULATORY_CARE_PROVIDER_SITE_OTHER): Payer: Medicaid Other | Admitting: Otolaryngology

## 2021-01-01 ENCOUNTER — Ambulatory Visit (INDEPENDENT_AMBULATORY_CARE_PROVIDER_SITE_OTHER): Payer: Medicaid Other | Admitting: Otolaryngology

## 2021-01-01 ENCOUNTER — Other Ambulatory Visit: Payer: Self-pay

## 2021-01-01 DIAGNOSIS — J351 Hypertrophy of tonsils: Secondary | ICD-10-CM

## 2021-01-01 DIAGNOSIS — M26609 Unspecified temporomandibular joint disorder, unspecified side: Secondary | ICD-10-CM

## 2021-01-01 DIAGNOSIS — H6123 Impacted cerumen, bilateral: Secondary | ICD-10-CM

## 2021-01-01 DIAGNOSIS — H9201 Otalgia, right ear: Secondary | ICD-10-CM

## 2021-01-01 NOTE — Progress Notes (Signed)
HPI: Tamara [name] is a 25 y.o. female who presents is referred by locum locum tenen from the ED for evaluation of right ear pain.  Patient apparently has been having intermittent sharp pains in the right ear for couple months and presented to the ED at which time she was referred to ENT for follow-up.  She was seen in the ED on 12/10/2020. 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 She has a lot of anxiety and low pain threshold..  Past Medical History:  Diagnosis Date   ADHD    Autism    Bipolar disorder (HCC)    Chronic back pain    "mostly lower; sometimes all over" (03/22/2017)   Chronic stomach ulcer    Depression    GERD (gastroesophageal reflux disease)    Headache    "a few/week" (03/22/2017)   Migraine    "monthly recently" (03/22/2017)   Personality disorder (HCC)    PTSD (post-traumatic stress disorder)    Seizures (HCC)    "very random; I lose memory of what happens; usually happens when I'm in bed; probably 2-3/year" (03/22/2017)   No past surgical history on file. Social History   Socioeconomic History   Marital status: Single    Spouse name: Not on file   Number of children: Not on file   Years of education: Not on file   Highest education level: Not on file  Occupational History   Not on file  Tobacco Use   Smoking status: Never   Smokeless tobacco: Never  Vaping Use   Vaping Use: Never used  Substance and Sexual Activity   Alcohol use: No    Alcohol/week: 0.0 standard drinks   Drug use: No   Sexual activity: Not Currently  Other Topics Concern   Not on file  Social History Narrative   Not on file   Social Determinants of Health   Financial Resource Strain: Not on file  Food Insecurity: Not on file  Transportation Needs: Not on file  Physical Activity: Not on file  Stress: Not on file  Social Connections: Not on file    Family History  Problem Relation Age of Onset   Diabetes Mother    Hypertension Maternal Grandmother    Hyperlipidemia Maternal Grandmother    Diabetes Maternal Grandmother    Allergies  Allergen Reactions   Asa [Aspirin] Anaphylaxis   Pork-Derived Products Shortness Of Breath   Lithium Hives   Prior to Admission medications   Medication Sig Start Date End Date Taking? Authorizing Provider  acetaminophen (TYLENOL) 500 MG tablet Take 2 tablets (1,000 mg total) by mouth every 8 (eight) hours as needed for moderate pain. 09/19/18   Law, Waylan Boga, PA-C  FLUoxetine (PROZAC) 20 MG capsule Take 1 capsule (20 mg total) by mouth daily. For mood/anxiety 07/15/18   Aldean Baker, NP  hydrOXYzine (ATARAX/VISTARIL) 25 MG tablet Take 1 tablet (25 mg total) by mouth at bedtime. For anxiety 07/14/18   Aldean Baker, NP  JUNEL 1.5/30 1.5-30 MG-MCG tablet Take 1 tablet by mouth daily. 09/23/18   [provider]  naproxen (NAPROSYN) 500 MG tablet Take 1 tablet (500 mg total) by mouth 2 (two) times daily as needed. 12/10/20   Milagros Loll, MD  pantoprazole (PROTONIX) 40 MG tablet Take 1 tablet (40 mg total) by mouth daily. For reflux 07/15/18   Gerilyn Pilgrim,  Marcelyn Ditty, NP     Positive ROS: Otherwise negative  All other systems have been reviewed and were otherwise negative with the exception of those mentioned in the HPI and as above.  Physical Exam: Constitutional: Alert, well-appearing, no acute distress Ears: External ears without lesions or tenderness.  Ear canals with moderate wax buildup in both ear canals that was cleaned with curette and suction.  After cleaning the ear canal the TMs were clear bilaterally with good mobility on pneumatic otoscopy both ear canals are clear with no inflammatory changes within the ear canal.  Hearing screening with a 512 1024 tuning fork revealed symmetric good hearing in both ears with AC > BC bilaterally. Nasal: External nose without lesions. Septum with  minimal deformity and mild rhinitis.  Both middle meatus regions were clear..  Attempted exam of the nasopharynx with the fiberoptic laryngoscope was unsuccessful as patient would not allow this to be passed within the nasal cavity even after using Afrin as well as topical 4% Xylocaine. Oral: Lips and gums without lesions. Tongue and palate mucosa without lesions.  On examination of the oropharynx the right tonsil is much larger than the left tonsil but no acute exudate noted on the tonsil. Neck: On examination of the neck there is no discrete palpable adenopathy or masses noted on either side.  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  She stated that she had her wisdom teeth removed this past March and she has noted more TMJ discomfort since then. Respiratory: Breathing comfortably  Skin: No facial/neck lesions or rash noted.  Cerumen impaction removal  Date/Time: 01/01/2021 1:08 PM Performed by: Drema Halon, MD Authorized by: Drema Halon, MD   Consent:    Consent obtained:  Verbal   Consent given by:  Patient   Risks discussed:  Pain and bleeding Procedure details:    Location:  L ear and R ear   Procedure type: curette and suction   Post-procedure details:    Inspection:  TM intact and canal normal   Hearing quality:  Improved   Procedure completion:  Tolerated well, no immediate complications Comments:     Moderate wax buildup in both ear canals that was cleaned with curette and suction.  Ear canals and TMs were otherwise clear.  Assessment: Right otalgia questionable etiology.  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 She also has an abnormally enlarged right tonsil compared to the left.  Plan: Reviewed with the patient that her ear canal and TM are clear and that the right ear pain is not coming from her ear canal or ear infection. This most likely is related to either TMJ pain or possibly referred  pain from the tonsil. I showed her the enlarged tonsil in the office today. I placed her on Augmentin 875 mg twice daily for 10 days and also suggested that she take ibuprofen or naproxen which she already has twice daily for the next 7 to 10 days to help with the pain. If she continues to have enlarged right tonsil following completion of antibiotics recommended that she follow-up with one of the other ENT practices as I will be retiring at the end of the week.   Narda Bonds, MD   CC:

## 2021-01-07 ENCOUNTER — Encounter (HOSPITAL_COMMUNITY): Payer: Self-pay | Admitting: Emergency Medicine

## 2021-01-07 ENCOUNTER — Emergency Department (HOSPITAL_COMMUNITY): Payer: Medicaid Other

## 2021-01-07 ENCOUNTER — Emergency Department (HOSPITAL_COMMUNITY)
Admission: EM | Admit: 2021-01-07 | Discharge: 2021-01-07 | Disposition: A | Discharge status: Home / Self Care | Payer: Medicaid Other | Attending: Emergency Medicine | Admitting: Emergency Medicine

## 2021-01-07 DIAGNOSIS — R5383 Other fatigue: Secondary | ICD-10-CM | POA: Diagnosis not present

## 2021-01-07 DIAGNOSIS — G8929 Other chronic pain: Secondary | ICD-10-CM | POA: Diagnosis not present

## 2021-01-07 DIAGNOSIS — R109 Unspecified abdominal pain: Secondary | ICD-10-CM | POA: Diagnosis not present

## 2021-01-07 DIAGNOSIS — R531 Weakness: Secondary | ICD-10-CM | POA: Diagnosis not present

## 2021-01-07 DIAGNOSIS — Z5321 Procedure and treatment not carried out due to patient leaving prior to being seen by health care provider: Secondary | ICD-10-CM | POA: Diagnosis not present

## 2021-01-07 DIAGNOSIS — R202 Paresthesia of skin: Secondary | ICD-10-CM | POA: Insufficient documentation

## 2021-01-07 DIAGNOSIS — M79642 Pain in left hand: Secondary | ICD-10-CM | POA: Diagnosis not present

## 2021-01-07 DIAGNOSIS — R413 Other amnesia: Secondary | ICD-10-CM | POA: Insufficient documentation

## 2021-01-07 DIAGNOSIS — M542 Cervicalgia: Secondary | ICD-10-CM | POA: Diagnosis not present

## 2021-01-07 DIAGNOSIS — M25512 Pain in left shoulder: Secondary | ICD-10-CM | POA: Diagnosis not present

## 2021-01-07 NOTE — ED Triage Notes (Signed)
Per pt, states she called her insurance company with complaints of left neck, shoulder, arm and hand pain-states she is also having some confusion and lapses in her memory-states she normally has these symptoms when she is under a lot of stress-insurance company told her to call 911-states she feels tired/fatigue and hungry

## 2021-01-07 NOTE — ED Provider Notes (Signed)
Emergency Medicine Provider Triage Evaluation Note  Tamara [name] , a 25 y.o. female  was evaluated in triage.  Pt complains of memory issues.  Patient states that she has been having memory issues for about 3 weeks now.  She says that she will be doing a simple task and she forgets what her own name is and what she is doing.  These episodes are usually pretty intermittent.  She states she has been under a tremendous amount of stress recently with losing her job and having some problems with her mother not supporting her.  She also states that she has some neck pain.  This started couple of days ago.  She also states that she has had significant increase in fatigue and generalized weakness over the past couple weeks with associated hot and cold flashes.  She describes intermittent numbness of her left arm that is associated with laying on it funny.  I asked her about the insurance company sending her here for neck, shoulder, arm, and hand pain, however she states that she is not having any other symptoms any longer.  Halfway through my assessment, patient asked why she was at the hospital and if she could go home.  She says that she has chronic abdominal pain. she denies chest pain, shortness of breath, nausea, vomiting, diarrhea, vision changes, weakness, headaches.  Review of Systems  Positive: Memory change, generalized weakness, neck pain, abdominal pain, numbness Negative: CP, dyspnea, n/v, diarrhea, vision changes, weakness, headaches  Physical Exam  BP 125/87 (BP Location: Right Arm)   Pulse 86   Temp 98.6 F (37 C) (Oral)   Resp 16   SpO2 98%  Gen:   Awake, no distress  Resp:  Normal effort MSK:   Moves extremities without difficulty Other:  During assessment, patient repeatedly asked questions more than once.  She forgot why she was here at 1 point during the assessment.  Cranial nerves II through XII grossly intact.  No motor weakness noted on exam.  Medical Decision Making   Medically screening exam initiated at 10:24 AM.  Appropriate orders placed.  Tamara [name] was informed that the remainder of the evaluation will be completed by another provider, this initial triage assessment does not replace that evaluation, and the importance of remaining in the ED until their evaluation is complete.  Hx of memory issues, hot and cold flashes, fatigue and generalized weakness over past few weeks with increased stress.  Ordered B12, TSH, Ammonia, RPR, basic labs, and CT head imaging. Possibility that this is all stress related.    Claudie Leach, PA-C 01/07/21 1034    Rozelle Logan, DO 01/07/21 1530

## 2021-01-31 ENCOUNTER — Emergency Department (HOSPITAL_COMMUNITY)
Admission: EM | Admit: 2021-01-31 | Discharge: 2021-01-31 | Disposition: A | Discharge status: Home / Self Care | Payer: Medicaid Other | Attending: Emergency Medicine | Admitting: Emergency Medicine

## 2021-01-31 ENCOUNTER — Other Ambulatory Visit: Payer: Self-pay

## 2021-01-31 ENCOUNTER — Encounter (HOSPITAL_COMMUNITY): Payer: Self-pay

## 2021-01-31 DIAGNOSIS — R630 Anorexia: Secondary | ICD-10-CM | POA: Diagnosis not present

## 2021-01-31 DIAGNOSIS — Z20822 Contact with and (suspected) exposure to covid-19: Secondary | ICD-10-CM | POA: Diagnosis not present

## 2021-01-31 DIAGNOSIS — Z28310 Unvaccinated for covid-19: Secondary | ICD-10-CM | POA: Diagnosis not present

## 2021-01-31 DIAGNOSIS — R1010 Upper abdominal pain, unspecified: Secondary | ICD-10-CM

## 2021-01-31 DIAGNOSIS — R1012 Left upper quadrant pain: Secondary | ICD-10-CM | POA: Insufficient documentation

## 2021-01-31 DIAGNOSIS — K219 Gastro-esophageal reflux disease without esophagitis: Secondary | ICD-10-CM | POA: Insufficient documentation

## 2021-01-31 DIAGNOSIS — D72829 Elevated white blood cell count, unspecified: Secondary | ICD-10-CM | POA: Insufficient documentation

## 2021-01-31 DIAGNOSIS — R5383 Other fatigue: Secondary | ICD-10-CM | POA: Insufficient documentation

## 2021-01-31 LAB — RESP PANEL BY RT-PCR (FLU A&B, COVID) ARPGX2
Influenza A by PCR: NEGATIVE
Influenza B by PCR: NEGATIVE
SARS Coronavirus 2 by RT PCR: NEGATIVE

## 2021-01-31 LAB — COMPREHENSIVE METABOLIC PANEL
ALT: 21 U/L (ref 0–44)
AST: 16 U/L (ref 15–41)
Albumin: 3.6 g/dL (ref 3.5–5.0)
Alkaline Phosphatase: 109 U/L (ref 38–126)
Anion gap: 5 (ref 5–15)
BUN: 9 mg/dL (ref 6–20)
CO2: 28 mmol/L (ref 22–32)
Calcium: 9 mg/dL (ref 8.9–10.3)
Chloride: 105 mmol/L (ref 98–111)
Creatinine, Ser: 0.85 mg/dL (ref 0.44–1.00)
GFR, Estimated: >60 mL/min (ref >60)
Glucose, Bld: 87 mg/dL (ref 70–99)
Potassium: 3.9 mmol/L (ref 3.5–5.1)
Sodium: 138 mmol/L (ref 135–145)
Total Bilirubin: 0.7 mg/dL (ref 0.3–1.2)
Total Protein: 7.5 g/dL (ref 6.5–8.1)

## 2021-01-31 LAB — CBC WITH DIFFERENTIAL/PLATELET
Abs Immature Granulocytes: 0.05 10*3/uL (ref 0.00–0.07)
Basophils Absolute: 0.1 10*3/uL (ref 0.0–0.1)
Basophils Relative: 0 %
Eosinophils Absolute: 0.3 10*3/uL (ref 0.0–0.5)
Eosinophils Relative: 2 %
HCT: 37 % (ref 36.0–46.0)
Hemoglobin: 11.4 g/dL — ABNORMAL LOW (ref 12.0–15.0)
Immature Granulocytes: 0 %
Lymphocytes Relative: 23 %
Lymphs Abs: 3.1 10*3/uL (ref 0.7–4.0)
MCH: 24.7 pg — ABNORMAL LOW (ref 26.0–34.0)
MCHC: 30.8 g/dL (ref 30.0–36.0)
MCV: 80.1 fL (ref 80.0–100.0)
Monocytes Absolute: 0.9 10*3/uL (ref 0.1–1.0)
Monocytes Relative: 6 %
Neutro Abs: 9.6 10*3/uL — ABNORMAL HIGH (ref 1.7–7.7)
Neutrophils Relative %: 69 %
Platelets: 450 10*3/uL — ABNORMAL HIGH (ref 150–400)
RBC: 4.62 MIL/uL (ref 3.87–5.11)
RDW: 15.4 % (ref 11.5–15.5)
WBC: 13.9 10*3/uL — ABNORMAL HIGH (ref 4.0–10.5)
nRBC: 0 % (ref 0.0–0.2)

## 2021-01-31 LAB — LIPASE, BLOOD: Lipase: 23 U/L (ref 11–51)

## 2021-01-31 LAB — I-STAT BETA HCG BLOOD, ED (MC, WL, AP ONLY): I-stat hCG, quantitative: <5 m[IU]/mL (ref <5)

## 2021-01-31 LAB — ETHANOL: Alcohol, Ethyl (B): <10 mg/dL (ref <10)

## 2021-01-31 MED ORDER — ONDANSETRON HCL 4 MG PO TABS: 4.0000 mg | ORAL_TABLET | Freq: Four times a day (QID) | ORAL | 0 refills | Status: AC

## 2021-01-31 MED ORDER — ACETAMINOPHEN 325 MG PO TABS
650.0000 mg | ORAL_TABLET | Freq: Once | ORAL | Status: AC
  Administered 2021-01-31: 650 mg via ORAL
  Filled 2021-01-31: qty 2

## 2021-01-31 NOTE — ED Notes (Signed)
Pt said she cannot give a urine sample at the moment

## 2021-01-31 NOTE — Discharge Instructions (Addendum)
Your work-up in the emergency department did not identify any medical emergencies as a cause of your pain.  Please call your primary care doctor's office to schedule a follow-up visit this week.

## 2021-01-31 NOTE — ED Triage Notes (Signed)
EMS reports from home, Pt c/o abdominal pain and fatigue, bilateral arm and leg pain with occasional tingles X a week.  BP 116 HR 82 RR 18 Sp02 100 RA CBG 130 Temp 97.2

## 2021-01-31 NOTE — ED Provider Notes (Signed)
[facility] DEPT Provider Note   CSN: [id number] Arrival date & time: 01/31/21  0945     History Chief Complaint  Patient presents with   Abdominal Pain   Arm Pain   Leg Pain    Tamara [name] is a 25 y.o. female with a history of bipolar disorder, ADHD, reflux, stomach problems, presenting to emergency department with general fatigue, tingling in the arms and feet.  She reports symptom onset a week ago.  She reports she has had nausea and some cramping left upper quadrant abdominal pain, which waxes and wanes.  She reports poor appetite.  She denies diarrhea or constipation.  She denies dysuria or hematuria.  She reports she feels excessively tired and has a hard time staying awake.  She has not had the flu vaccine.  She denies fevers or chills.  She denies headache.  She reports occasional "tingling in my hands and feet".  HPI     Past Medical History:  Diagnosis Date   ADHD    Autism    Bipolar disorder ([place])    Chronic back pain    "mostly lower; sometimes all over" (03/22/2017)   Chronic stomach ulcer    Depression    GERD (gastroesophageal reflux disease)    Headache    "a few/week" (03/22/2017)   Migraine    "monthly recently" (03/22/2017)   Personality disorder ([place])    PTSD (post-traumatic stress disorder)    Seizures ([place])    "very random; I lose memory of what happens; usually happens when I'm in bed; probably 2-3/year" (03/22/2017)    Patient Active Problem List   Diagnosis Date Noted   Major depressive disorder, recurrent, severe with psychotic features ([place]) 10/17/2018   Bipolar 1 disorder ([place]) 08/25/2018   MDD (major depressive disorder), single episode, severe with psychotic features ([place]) 07/27/2018   Bipolar affective disorder, depressed, moderate ([place]) 07/09/2018   Borderline personality disorder ([place]) 12/20/2016    History reviewed. No pertinent surgical history.   OB History   No obstetric history on  file.     Family History  Problem Relation Age of Onset   Diabetes Mother    Hypertension Maternal Grandmother    Hyperlipidemia Maternal Grandmother    Diabetes Maternal Grandmother     Social History   Tobacco Use   Smoking status: Never   Smokeless tobacco: Never  Vaping Use   Vaping Use: Never used  Substance Use Topics   Alcohol use: No    Alcohol/week: 0.0 standard drinks   Drug use: No    Home Medications Prior to Admission medications   Medication Sig Start Date End Date Taking? Authorizing Provider  ondansetron (ZOFRAN) 4 MG tablet Take 1 tablet (4 mg total) by mouth every 6 (six) hours for 12 doses. 01/31/21 02/03/21 Yes Clester Chlebowski, Carola Rhine, MD  acetaminophen (TYLENOL) 500 MG tablet Take 2 tablets (1,000 mg total) by mouth every 8 (eight) hours as needed for moderate pain. 09/19/18   Law, Bea Graff, PA-C  FLUoxetine (PROZAC) 20 MG capsule Take 1 capsule (20 mg total) by mouth daily. For mood/anxiety 07/15/18   Connye Burkitt, NP  hydrOXYzine (ATARAX/VISTARIL) 25 MG tablet Take 1 tablet (25 mg total) by mouth at bedtime. For anxiety 07/14/18   Connye Burkitt, NP  JUNEL 1.5/30 1.5-30 MG-MCG tablet Take 1 tablet by mouth daily. 09/23/18   [provider]  naproxen (NAPROSYN) 500 MG tablet Take 1 tablet (500 mg total) by mouth 2 (two)  times daily as needed. 12/10/20   Milagros Loll, MD  pantoprazole (PROTONIX) 40 MG tablet Take 1 tablet (40 mg total) by mouth daily. For reflux 07/15/18   Aldean Baker, NP    Allergies    Asa [aspirin], Pork-derived products, and Lithium  Review of Systems   Review of Systems  Constitutional:  Positive for appetite change and fatigue. Negative for chills and fever.  Eyes:  Negative for pain and visual disturbance.  Respiratory:  Negative for cough and shortness of breath.   Cardiovascular:  Negative for chest pain and palpitations.  Gastrointestinal:  Positive for abdominal pain and nausea. Negative for constipation, diarrhea  and vomiting.  Musculoskeletal:  Negative for arthralgias and myalgias.  Skin:  Negative for color change and rash.  Neurological:  Negative for syncope, weakness and headaches.  All other systems reviewed and are negative.  Physical Exam Updated Vital Signs BP (!) 163/97   Pulse 75   Temp (!) 97.4 F (36.3 C) (Oral)   Resp 18   SpO2 96%   Physical Exam Constitutional:      General: She is not in acute distress.    Comments: Somnolent, repeatedly falling asleep and snoring during history and exam  HENT:     Head: Normocephalic and atraumatic.  Eyes:     Conjunctiva/sclera: Conjunctivae normal.     Pupils: Pupils are equal, round, and reactive to light.  Cardiovascular:     Rate and Rhythm: Normal rate and regular rhythm.  Pulmonary:     Effort: Pulmonary effort is normal. No respiratory distress.  Abdominal:     General: There is no distension.     Tenderness: There is no abdominal tenderness.  Skin:    General: Skin is warm and dry.  Neurological:     General: No focal deficit present.     Mental Status: She is alert. Mental status is at baseline.  Psychiatric:        Mood and Affect: Mood normal.        Behavior: Behavior normal.    ED Results / Procedures / Treatments   Labs (all labs ordered are listed, but only abnormal results are displayed) Labs Reviewed  CBC WITH DIFFERENTIAL/PLATELET - Abnormal; Notable for the following components:      Result Value   WBC 13.9 (*)    Hemoglobin 11.4 (*)    MCH 24.7 (*)    Platelets 450 (*)    Neutro Abs 9.6 (*)    All other components within normal limits  RESP PANEL BY RT-PCR (FLU A&B, COVID) ARPGX2  COMPREHENSIVE METABOLIC PANEL  LIPASE, BLOOD  ETHANOL  URINALYSIS, ROUTINE W REFLEX MICROSCOPIC  RAPID URINE DRUG SCREEN, HOSP PERFORMED  I-STAT BETA HCG BLOOD, ED ([facility], [facility], [facility] ONLY)    EKG None  Radiology No results found.  Procedures Procedures   Medications Ordered in ED Medications  acetaminophen  (TYLENOL) tablet 650 mg (650 mg Oral Given 01/31/21 1210)    ED Course  I have reviewed the triage vital signs and the nursing notes.  Pertinent labs & imaging results that were available during my care of the patient were reviewed by me and considered in my medical decision making (see chart for details).  Differential diagnosis includes viral illness including COVID-19 or influenza versus gastritis versus anemia versus dehydration versus other.  She does have a bizarre affect and is repeatedly falling asleep on my exam, waking up and apologizing afterwards.  She tells me that "sometimes she  gets like this".  We will check COVID and flu and some basic blood work.  Have a low suspicion for acute intra-abdominal surgical emergency or acute biliary disease.  She has no rebound or guarding.  Clinical Course as of 01/31/21 1307  Fri Jan 31, 2021  1151 Patient's blood test unremarkable.  She appears to have a very chronic mild leukocytosis, but today is better than normal.  Her COVID and flu are negative.  I strongly doubt that this is sepsis.  No evidence of acute biliary disease or lab abnormalities to suggest biliary dysfunction.  Lipase is normal.  Would advise PCP follow-up [MT]    Clinical Course User Index [MT] Marise Knapper, Carola Rhine, MD    Final Clinical Impression(s) / ED Diagnoses Final diagnoses:  Pain of upper abdomen  Other fatigue    Rx / DC Orders ED Discharge Orders          Ordered    ondansetron (ZOFRAN) 4 MG tablet  Every 6 hours        01/31/21 1153             Wyvonnia Dusky, MD 01/31/21 1307

## 2021-02-06 ENCOUNTER — Telehealth: Payer: Medicaid Other | Admitting: Physician Assistant

## 2021-02-06 DIAGNOSIS — H04302 Unspecified dacryocystitis of left lacrimal passage: Secondary | ICD-10-CM

## 2021-02-06 MED ORDER — ERYTHROMYCIN 5 MG/GM OP OINT: 1.0000 "application " | TOPICAL_OINTMENT | Freq: Three times a day (TID) | OPHTHALMIC | 0 refills | Status: DC

## 2021-02-06 NOTE — Patient Instructions (Signed)
  Tamara [name], thank you for joining Margaretann Loveless, PA-C for today's virtual visit.  While this provider is not your primary care provider (PCP), if your PCP is located in our provider database this encounter information will be shared with them immediately following your visit.  Consent: (Patient) Tamara [name] provided verbal consent for this virtual visit at the beginning of the encounter.  Current Medications:  Current Outpatient Medications:    erythromycin ophthalmic ointment, Place 1 application into the left eye 3 (three) times daily., Disp: 14 g, Rfl: 0   acetaminophen (TYLENOL) 500 MG tablet, Take 2 tablets (1,000 mg total) by mouth every 8 (eight) hours as needed for moderate pain., Disp: 30 tablet, Rfl: 0   FLUoxetine (PROZAC) 20 MG capsule, Take 1 capsule (20 mg total) by mouth daily. For mood/anxiety, Disp: 30 capsule, Rfl: 0   hydrOXYzine (ATARAX/VISTARIL) 25 MG tablet, Take 1 tablet (25 mg total) by mouth at bedtime. For anxiety, Disp: 30 tablet, Rfl: 0   JUNEL 1.5/30 1.5-30 MG-MCG tablet, Take 1 tablet by mouth daily., Disp: , Rfl:    naproxen (NAPROSYN) 500 MG tablet, Take 1 tablet (500 mg total) by mouth 2 (two) times daily as needed., Disp: 30 tablet, Rfl: 0   pantoprazole (PROTONIX) 40 MG tablet, Take 1 tablet (40 mg total) by mouth daily. For reflux, Disp: 30 tablet, Rfl: 0   Medications ordered in this encounter:  Meds ordered this encounter  Medications   erythromycin ophthalmic ointment    Sig: Place 1 application into the left eye 3 (three) times daily.    Dispense:  14 g    Refill:  0    Order Specific Question:   Supervising Provider    Answer:   Hyacinth Meeker, BRIAN [[id number]]     *If you need refills on other medications prior to your next appointment, please contact your pharmacy*  Follow-Up: Call back or seek an in-person evaluation if the symptoms worsen or if the condition fails to improve as anticipated.   If you have been instructed  to have an in-person evaluation today at a local Urgent Care facility, please use the link below. It will take you to a list of all of our available [facility], including address, phone number and hours of operation. Please do not delay care.  [facility]  If you or a family member do not have a primary care provider, use the link below to schedule a visit and establish care. When you choose a [facility] primary care physician or advanced practice provider, you gain a long-term partner in health. Find a Primary Care Provider  Learn more about [facility] in-office and virtual care options: [facility] - Get Care Now

## 2021-02-06 NOTE — Progress Notes (Signed)
Virtual Visit Consent   Tamara [name], you are scheduled for a virtual visit with a [facility] provider today.     Just as with appointments in the office, your consent must be obtained to participate.  Your consent will be active for this visit and any virtual visit you may have with one of our providers in the next 365 days.     If you have a MyChart account, a copy of this consent can be sent to you electronically.  All virtual visits are billed to your insurance company just like a traditional visit in the office.    As this is a virtual visit, video technology does not allow for your provider to perform a traditional examination.  This may limit your provider's ability to fully assess your condition.  If your provider identifies any concerns that need to be evaluated in person or the need to arrange testing (such as labs, EKG, etc.), we will make arrangements to do so.     Although advances in technology are sophisticated, we cannot ensure that it will always work on either your end or our end.  If the connection with a video visit is poor, the visit may have to be switched to a telephone visit.  With either a video or telephone visit, we are not always able to ensure that we have a secure connection.     I need to obtain your verbal consent now.   Are you willing to proceed with your visit today?    Tamara [name] has provided verbal consent on 02/06/2021 for a virtual visit (video or telephone).   Margaretann Loveless, PA-C   Date: 02/06/2021 8:36 AM   Virtual Visit via Video Note   I, Margaretann Loveless, connected with  Tamara [name]  ([id number], 03-31-1995) on 02/06/21 at  8:15 AM EST by a video-enabled telemedicine application and verified that I am speaking with the correct person using two identifiers.  Location: Patient: Virtual Visit Location Patient: Home Provider: Virtual Visit Location Provider: Home Office   I discussed the limitations of  evaluation and management by telemedicine and the availability of in person appointments. The patient expressed understanding and agreed to proceed.    History of Present Illness: Tamara [name] is a 25 y.o. who identifies as a female who was assigned female at birth, and is being seen today for a bump on her lower lash line of the left eye. Reports it started yesterday and was very painful. She took tylenol and aleve without relief. She has been using a warm compress over the area and reports having white, purulent discharge with using the compress. Since the area started draining she reports that pain is improving. Denies fevers, chills, nausea, vomiting, or vision changes.     Problems:  Patient Active Problem List   Diagnosis Date Noted   Major depressive disorder, recurrent, severe with psychotic features (HCC) 10/17/2018   Bipolar 1 disorder (HCC) 08/25/2018   MDD (major depressive disorder), single episode, severe with psychotic features (HCC) 07/27/2018   Bipolar affective disorder, depressed, moderate (HCC) 07/09/2018   Borderline personality disorder (HCC) 12/20/2016    Allergies:  Allergies  Allergen Reactions   Asa [Aspirin] Anaphylaxis   Pork-Derived Products Shortness Of Breath   Lithium Hives   Medications:  Current Outpatient Medications:    erythromycin ophthalmic ointment, Place 1 application into the left eye 3 (three) times daily., Disp: 14 g, Rfl: 0   acetaminophen (TYLENOL) 500  MG tablet, Take 2 tablets (1,000 mg total) by mouth every 8 (eight) hours as needed for moderate pain., Disp: 30 tablet, Rfl: 0   FLUoxetine (PROZAC) 20 MG capsule, Take 1 capsule (20 mg total) by mouth daily. For mood/anxiety, Disp: 30 capsule, Rfl: 0   hydrOXYzine (ATARAX/VISTARIL) 25 MG tablet, Take 1 tablet (25 mg total) by mouth at bedtime. For anxiety, Disp: 30 tablet, Rfl: 0   JUNEL 1.5/30 1.5-30 MG-MCG tablet, Take 1 tablet by mouth daily., Disp: , Rfl:    naproxen (NAPROSYN)  500 MG tablet, Take 1 tablet (500 mg total) by mouth 2 (two) times daily as needed., Disp: 30 tablet, Rfl: 0   pantoprazole (PROTONIX) 40 MG tablet, Take 1 tablet (40 mg total) by mouth daily. For reflux, Disp: 30 tablet, Rfl: 0  Observations/Objective: Patient is well-developed, well-nourished in no acute distress.  Resting comfortably at home.  Head is normocephalic, atraumatic.  No labored breathing.  Speech is clear and coherent with logical content.  Patient is alert and oriented at baseline.  Swelling and redness over left lower lid internally over the lacrimal papilla  Assessment and Plan: 1. Infection of left tear duct - erythromycin ophthalmic ointment; Place 1 application into the left eye 3 (three) times daily.  Dispense: 14 g; Refill: 0  - Suspected infection of the tear duct due to location of swelling.  - Erythromycin ointment prescribed  - Continue warm compresses - Seek in person evaluation if symptoms fail to improve or if they worsen  Follow Up Instructions: I discussed the assessment and treatment plan with the patient. The patient was provided an opportunity to ask questions and all were answered. The patient agreed with the plan and demonstrated an understanding of the instructions.  A copy of instructions were sent to the patient via MyChart unless otherwise noted below.    The patient was advised to call back or seek an in-person evaluation if the symptoms worsen or if the condition fails to improve as anticipated.  Time:  I spent 15 minutes with the patient via telehealth technology discussing the above problems/concerns.    Margaretann Loveless, PA-C

## 2021-03-11 ENCOUNTER — Other Ambulatory Visit: Payer: Self-pay | Admitting: Nurse Practitioner

## 2021-03-11 DIAGNOSIS — R1011 Right upper quadrant pain: Secondary | ICD-10-CM

## 2021-03-27 ENCOUNTER — Emergency Department (HOSPITAL_COMMUNITY): Payer: Medicaid Other

## 2021-03-27 ENCOUNTER — Emergency Department (HOSPITAL_COMMUNITY)
Admission: EM | Admit: 2021-03-27 | Discharge: 2021-03-28 | Disposition: A | Discharge status: Home / Self Care | Payer: Medicaid Other | Attending: Emergency Medicine | Admitting: Emergency Medicine

## 2021-03-27 ENCOUNTER — Other Ambulatory Visit: Payer: Self-pay

## 2021-03-27 ENCOUNTER — Encounter (HOSPITAL_COMMUNITY): Payer: Self-pay

## 2021-03-27 DIAGNOSIS — M79604 Pain in right leg: Secondary | ICD-10-CM | POA: Insufficient documentation

## 2021-03-27 DIAGNOSIS — Z5321 Procedure and treatment not carried out due to patient leaving prior to being seen by health care provider: Secondary | ICD-10-CM | POA: Insufficient documentation

## 2021-03-27 DIAGNOSIS — M79605 Pain in left leg: Secondary | ICD-10-CM | POA: Insufficient documentation

## 2021-03-27 DIAGNOSIS — M545 Low back pain, unspecified: Secondary | ICD-10-CM | POA: Diagnosis not present

## 2021-03-27 DIAGNOSIS — R1084 Generalized abdominal pain: Secondary | ICD-10-CM | POA: Insufficient documentation

## 2021-03-27 LAB — COMPREHENSIVE METABOLIC PANEL
ALT: 19 U/L (ref 0–44)
AST: 17 U/L (ref 15–41)
Albumin: 3.6 g/dL (ref 3.5–5.0)
Alkaline Phosphatase: 131 U/L — ABNORMAL HIGH (ref 38–126)
Anion gap: 8 (ref 5–15)
BUN: 15 mg/dL (ref 6–20)
CO2: 24 mmol/L (ref 22–32)
Calcium: 9 mg/dL (ref 8.9–10.3)
Chloride: 105 mmol/L (ref 98–111)
Creatinine, Ser: 0.8 mg/dL (ref 0.44–1.00)
GFR, Estimated: >60 mL/min (ref >60)
Glucose, Bld: 138 mg/dL — ABNORMAL HIGH (ref 70–99)
Potassium: 3.9 mmol/L (ref 3.5–5.1)
Sodium: 137 mmol/L (ref 135–145)
Total Bilirubin: 0.3 mg/dL (ref 0.3–1.2)
Total Protein: 7.9 g/dL (ref 6.5–8.1)

## 2021-03-27 LAB — CBC WITH DIFFERENTIAL/PLATELET
Abs Immature Granulocytes: 0.09 10*3/uL — ABNORMAL HIGH (ref 0.00–0.07)
Basophils Absolute: 0.1 10*3/uL (ref 0.0–0.1)
Basophils Relative: 0 %
Eosinophils Absolute: 0.2 10*3/uL (ref 0.0–0.5)
Eosinophils Relative: 1 %
HCT: 37.1 % (ref 36.0–46.0)
Hemoglobin: 11.5 g/dL — ABNORMAL LOW (ref 12.0–15.0)
Immature Granulocytes: 1 %
Lymphocytes Relative: 24 %
Lymphs Abs: 4.6 10*3/uL — ABNORMAL HIGH (ref 0.7–4.0)
MCH: 24.9 pg — ABNORMAL LOW (ref 26.0–34.0)
MCHC: 31 g/dL (ref 30.0–36.0)
MCV: 80.3 fL (ref 80.0–100.0)
Monocytes Absolute: 0.8 10*3/uL (ref 0.1–1.0)
Monocytes Relative: 4 %
Neutro Abs: 13.5 10*3/uL — ABNORMAL HIGH (ref 1.7–7.7)
Neutrophils Relative %: 70 %
Platelets: 474 10*3/uL — ABNORMAL HIGH (ref 150–400)
RBC: 4.62 MIL/uL (ref 3.87–5.11)
RDW: 16.1 % — ABNORMAL HIGH (ref 11.5–15.5)
WBC: 19.2 10*3/uL — ABNORMAL HIGH (ref 4.0–10.5)
nRBC: 0 % (ref 0.0–0.2)

## 2021-03-27 LAB — I-STAT BETA HCG BLOOD, ED (MC, WL, AP ONLY): I-stat hCG, quantitative: <5 m[IU]/mL (ref <5)

## 2021-03-27 LAB — LIPASE, BLOOD: Lipase: 29 U/L (ref 11–51)

## 2021-03-27 NOTE — ED Notes (Signed)
Pt attempted to collect urine specimen and dropped cup in toilet

## 2021-03-27 NOTE — ED Provider Triage Note (Signed)
Emergency Medicine Provider Triage Evaluation Note  Tamara [name] , a 26 y.o. female  was evaluated in triage.  Pt complains of multiple complaints.  Complaining of bilateral leg pain.  This has been chronic and has been on and off for multiple years.  No associated swelling.  She also complains of abdominal pain that is also been on and off for multiple years.  She says it is much worse today.  Is generalized.  She has been nauseous as well.  She also complains of lower back pain that has been chronic and on and off.  She says it is more severe today.  She states that she cannot walk places as fast as other people.  Is concerned something is wrong.   Review of Systems  Positive:  Negative:   Physical Exam  BP (!) 153/66 (BP Location: Right Arm)    Pulse 91    Temp 98.4 F (36.9 C) (Oral)    Resp 20    Ht 5\' 1"  (1.549 m)    Wt (!) 158.8 kg    SpO2 99%    BMI 66.13 kg/m  Gen:   Awake, no distress   Resp:  Normal effort  MSK:   Moves extremities without difficulty  Other:    Medical Decision Making  Medically screening exam initiated at 9:09 PM.  Appropriate orders placed.  was informed that the remainder of the evaluation will be completed by another provider, this initial triage assessment does not replace that evaluation, and the importance of remaining in the ED until their evaluation is complete.     Tamara [name], Claudie Leach 03/27/21 2110

## 2021-03-27 NOTE — ED Triage Notes (Signed)
Pt reports left thigh -> knee pain and right knee -> toes pain x many years. Says she fell several years ago and right side of ribs has hurt ever since.

## 2021-04-25 ENCOUNTER — Encounter (HOSPITAL_COMMUNITY): Payer: Self-pay

## 2021-04-25 ENCOUNTER — Encounter (HOSPITAL_BASED_OUTPATIENT_CLINIC_OR_DEPARTMENT_OTHER): Payer: Self-pay | Admitting: *Deleted

## 2021-04-25 ENCOUNTER — Emergency Department (HOSPITAL_BASED_OUTPATIENT_CLINIC_OR_DEPARTMENT_OTHER)
Admission: EM | Admit: 2021-04-25 | Discharge: 2021-04-26 | Disposition: A | Discharge status: Home / Self Care | Payer: Medicaid Other | Source: Home / Self Care | Attending: Emergency Medicine | Admitting: Emergency Medicine

## 2021-04-25 ENCOUNTER — Telehealth: Payer: Self-pay | Admitting: Gastroenterology

## 2021-04-25 ENCOUNTER — Emergency Department (HOSPITAL_COMMUNITY)
Admission: EM | Admit: 2021-04-25 | Discharge: 2021-04-25 | Discharge status: Against Medical Advice | Payer: Medicaid Other | Attending: Emergency Medicine | Admitting: Emergency Medicine

## 2021-04-25 ENCOUNTER — Other Ambulatory Visit: Payer: Self-pay

## 2021-04-25 ENCOUNTER — Ambulatory Visit: Payer: Self-pay

## 2021-04-25 DIAGNOSIS — Z5321 Procedure and treatment not carried out due to patient leaving prior to being seen by health care provider: Secondary | ICD-10-CM | POA: Insufficient documentation

## 2021-04-25 DIAGNOSIS — K529 Noninfective gastroenteritis and colitis, unspecified: Secondary | ICD-10-CM

## 2021-04-25 DIAGNOSIS — R109 Unspecified abdominal pain: Secondary | ICD-10-CM | POA: Diagnosis present

## 2021-04-25 DIAGNOSIS — R197 Diarrhea, unspecified: Secondary | ICD-10-CM | POA: Insufficient documentation

## 2021-04-25 DIAGNOSIS — J029 Acute pharyngitis, unspecified: Secondary | ICD-10-CM | POA: Insufficient documentation

## 2021-04-25 LAB — URINALYSIS, ROUTINE W REFLEX MICROSCOPIC
Bilirubin Urine: NEGATIVE
Glucose, UA: NEGATIVE mg/dL
Hgb urine dipstick: NEGATIVE
Leukocytes,Ua: NEGATIVE
Nitrite: NEGATIVE
Protein, ur: 30 mg/dL — AB
Specific Gravity, Urine: 1.033 — ABNORMAL HIGH (ref 1.005–1.030)
pH: 5.5 (ref 5.0–8.0)

## 2021-04-25 LAB — COMPREHENSIVE METABOLIC PANEL
ALT: 19 U/L (ref 0–44)
AST: 14 U/L — ABNORMAL LOW (ref 15–41)
Albumin: 3.9 g/dL (ref 3.5–5.0)
Alkaline Phosphatase: 103 U/L (ref 38–126)
Anion gap: 11 (ref 5–15)
BUN: 9 mg/dL (ref 6–20)
CO2: 21 mmol/L — ABNORMAL LOW (ref 22–32)
Calcium: 8.8 mg/dL — ABNORMAL LOW (ref 8.9–10.3)
Chloride: 105 mmol/L (ref 98–111)
Creatinine, Ser: 0.73 mg/dL (ref 0.44–1.00)
GFR, Estimated: >60 mL/min (ref >60)
Glucose, Bld: 125 mg/dL — ABNORMAL HIGH (ref 70–99)
Potassium: 3.4 mmol/L — ABNORMAL LOW (ref 3.5–5.1)
Sodium: 137 mmol/L (ref 135–145)
Total Bilirubin: 0.5 mg/dL (ref 0.3–1.2)
Total Protein: 7.2 g/dL (ref 6.5–8.1)

## 2021-04-25 LAB — CBC
HCT: 38.5 % (ref 36.0–46.0)
Hemoglobin: 12.1 g/dL (ref 12.0–15.0)
MCH: 24.3 pg — ABNORMAL LOW (ref 26.0–34.0)
MCHC: 31.4 g/dL (ref 30.0–36.0)
MCV: 77.3 fL — ABNORMAL LOW (ref 80.0–100.0)
Platelets: 429 10*3/uL — ABNORMAL HIGH (ref 150–400)
RBC: 4.98 MIL/uL (ref 3.87–5.11)
RDW: 16.7 % — ABNORMAL HIGH (ref 11.5–15.5)
WBC: 11.9 10*3/uL — ABNORMAL HIGH (ref 4.0–10.5)
nRBC: 0 % (ref 0.0–0.2)

## 2021-04-25 LAB — PREGNANCY, URINE: Preg Test, Ur: NEGATIVE

## 2021-04-25 LAB — LIPASE, BLOOD: Lipase: <10 U/L — ABNORMAL LOW (ref 11–51)

## 2021-04-25 MED ORDER — ONDANSETRON HCL 4 MG/2ML IJ SOLN
4.0000 mg | Freq: Once | INTRAMUSCULAR | Status: DC
  Filled 2021-04-25: qty 2

## 2021-04-25 MED ORDER — SODIUM CHLORIDE 0.9 % IV BOLUS: 1000.0000 mL | Freq: Once | INTRAVENOUS | Status: DC

## 2021-04-25 MED ORDER — ALUM & MAG HYDROXIDE-SIMETH 200-200-20 MG/5ML PO SUSP
15.0000 mL | Freq: Once | ORAL | Status: AC
Start: 2021-04-25 — End: 2021-04-25
  Administered 2021-04-25: 15 mL via ORAL
  Filled 2021-04-25: qty 30

## 2021-04-25 MED ORDER — FAMOTIDINE IN NACL 20-0.9 MG/50ML-% IV SOLN
20.0000 mg | Freq: Once | INTRAVENOUS | Status: DC
  Filled 2021-04-25: qty 50

## 2021-04-25 MED ORDER — ONDANSETRON 4 MG PO TBDP
8.0000 mg | ORAL_TABLET | Freq: Once | ORAL | Status: AC
  Administered 2021-04-25: 8 mg via ORAL
  Filled 2021-04-25: qty 2

## 2021-04-25 NOTE — ED Provider Notes (Signed)
[facility] EMERGENCY DEPT Provider Note   CSN: [id number] Arrival date & time: 04/25/21  1918     History  Chief Complaint  Patient presents with   Abdominal Pain    Tamara [name] is a 26 y.o. adult.  Pt is a 26 yo female presenting for abdominal pain. Pt admits to recent use of Augmentin use for viral URI. Admits to diffuse abdominal pain, diarrhea, nausea, and vomiting. Admits to 7-8 episodes of watery stools in the last 24 hours. Admits to 5 episodes of nonbillious nonbloody emesis today. Denies fevers or chills.   The history is provided by the patient. No language interpreter was used.  Abdominal Pain Associated symptoms: diarrhea, nausea and vomiting   Associated symptoms: no chest pain, no chills, no cough, no dysuria, no fever, no hematuria, no shortness of breath and no sore throat       Home Medications Prior to Admission medications   Medication Sig Start Date End Date Taking? Authorizing Provider  acetaminophen (TYLENOL) 500 MG tablet Take 2 tablets (1,000 mg total) by mouth every 8 (eight) hours as needed for moderate pain. 09/19/18   Frederica Kuster, PA-C  erythromycin ophthalmic ointment Place 1 application into the left eye 3 (three) times daily. 02/06/21   Mar Daring, PA-C  FLUoxetine (PROZAC) 20 MG capsule Take 1 capsule (20 mg total) by mouth daily. For mood/anxiety 07/15/18   Connye Burkitt, NP  hydrOXYzine (ATARAX/VISTARIL) 25 MG tablet Take 1 tablet (25 mg total) by mouth at bedtime. For anxiety 07/14/18   Connye Burkitt, NP  JUNEL 1.5/30 1.5-30 MG-MCG tablet Take 1 tablet by mouth daily. 09/23/18   [provider]  naproxen (NAPROSYN) 500 MG tablet Take 1 tablet (500 mg total) by mouth 2 (two) times daily as needed. 12/10/20   Lucrezia Starch, MD  pantoprazole (PROTONIX) 40 MG tablet Take 1 tablet (40 mg total) by mouth daily. For reflux 07/15/18   Connye Burkitt, NP      Allergies    Asa [aspirin], Pork allergy,  Pork-derived products, Lithium, and Shrimp extract allergy skin test    Review of Systems   Review of Systems  Constitutional:  Negative for chills and fever.  HENT:  Negative for ear pain and sore throat.   Eyes:  Negative for pain and visual disturbance.  Respiratory:  Negative for cough and shortness of breath.   Cardiovascular:  Negative for chest pain and palpitations.  Gastrointestinal:  Positive for abdominal pain, diarrhea, nausea and vomiting.  Genitourinary:  Negative for dysuria and hematuria.  Musculoskeletal:  Negative for arthralgias and back pain.  Skin:  Negative for color change and rash.  Neurological:  Negative for seizures and syncope.  All other systems reviewed and are negative.  Physical Exam Updated Vital Signs BP (!) 130/102 (BP Location: Right Arm)    Pulse (!) 102    Temp 98 F (36.7 C) (Oral)    Resp 19    Wt (!) 159 kg    SpO2 95%    BMI 66.23 kg/m  Physical Exam Vitals and nursing note reviewed.  Constitutional:      General: Tamara [name] is not in acute distress.    Appearance: Tamara [name] is well-developed.  HENT:     Head: Normocephalic and atraumatic.  Eyes:     Conjunctiva/sclera: Conjunctivae normal.  Cardiovascular:     Rate and Rhythm: Normal rate and regular rhythm.     Heart sounds: No  murmur heard. Pulmonary:     Effort: Pulmonary effort is normal. No respiratory distress.     Breath sounds: Normal breath sounds.  Abdominal:     Palpations: Abdomen is soft.     Tenderness: There is generalized abdominal tenderness. There is no guarding or rebound.  Musculoskeletal:        General: No swelling.     Cervical back: Neck supple.  Skin:    General: Skin is warm and dry.     Capillary Refill: Capillary refill takes less than 2 seconds.  Neurological:     Mental Status: Tamara [name] is alert.  Psychiatric:        Mood and Affect: Mood normal.    ED Results / Procedures / Treatments   Labs (all labs  ordered are listed, but only abnormal results are displayed) Labs Reviewed  LIPASE, BLOOD - Abnormal; Notable for the following components:      Result Value   Lipase <10 (*)    All other components within normal limits  COMPREHENSIVE METABOLIC PANEL - Abnormal; Notable for the following components:   Potassium 3.4 (*)    CO2 21 (*)    Glucose, Bld 125 (*)    Calcium 8.8 (*)    AST 14 (*)    All other components within normal limits  CBC - Abnormal; Notable for the following components:   WBC 11.9 (*)    MCV 77.3 (*)    MCH 24.3 (*)    RDW 16.7 (*)    Platelets 429 (*)    All other components within normal limits  URINALYSIS, ROUTINE W REFLEX MICROSCOPIC - Abnormal; Notable for the following components:   Specific Gravity, Urine 1.033 (*)    Ketones, ur TRACE (*)    Protein, ur 30 (*)    Bacteria, UA RARE (*)    All other components within normal limits  PREGNANCY, URINE    EKG None  Radiology No results found.  Procedures Procedures    Medications Ordered in ED Medications - No data to display  ED Course/ Medical Decision Making/ A&P                           Medical Decision Making Amount and/or Complexity of Data Reviewed Labs: ordered.  Risk OTC drugs. Prescription drug management.   10:56 PM 26 yo female presenting for abdominal pain. Pt admits to recent use of Augmentin use for viral URI. Admits to diffuse abdominal pain, diarrhea, nausea, and vomiting. Patient is Aox3, no acute distress, afebrile, with stable vitals. Abdomen is soft with minimal tenderness to palpation.   IVF, Zofran, pepcid, maalox given for symptomatic management. Symptoms likely due to gastroenteritis secondary to Augmentin use.   Pt signed out to oncoming physician. Plan for DC after meds.         Final Clinical Impression(s) / ED Diagnoses Final diagnoses:  Gastroenteritis    Rx / DC Orders ED Discharge Orders     None         Lianne Cure, DO [id number]  [id number]

## 2021-04-25 NOTE — ED Triage Notes (Signed)
Patient states she began having abdominal pain  and vomiting 2 weeks ago and diarrhea today. Patient states she has been taking Amoxicillin for pharyngitis and diarrhea started after that.

## 2021-04-25 NOTE — ED Notes (Signed)
Patients pronouns preference is they, them

## 2021-04-25 NOTE — Telephone Encounter (Signed)
Left message for pt to call back  °

## 2021-04-25 NOTE — ED Provider Triage Note (Signed)
Emergency Medicine Provider Triage Evaluation Note  Tamara [name] , a 26 y.o. adult  was evaluated in triage.  Pt complains of abd pain.  Patient reports she had pharyngitis diagnosed over 2 weeks ago and was treated with amoxicillin.  She has been having abdominal pain nausea vomiting as well as diarrhea for the same duration with increased diarrhea for the past few days.  Review of Systems  Positive: Abd pain, n/v/d Negative: Fever, cough, sob, dysuria  Physical Exam  BP (!) 132/58 (BP Location: Right Arm)    Pulse (!) 118    Temp 98 F (36.7 C) (Oral)    Resp 18    Ht 5\' 1"  (1.549 m)    Wt (!) 159.7 kg    SpO2 98%    BMI 66.51 kg/m  Gen:   Awake, no distress   Resp:  Normal effort  MSK:   Moves extremities without difficulty  Other:    Medical Decision Making  Medically screening exam initiated at 11:41 AM.  Appropriate orders placed.  was informed that the remainder of the evaluation will be completed by another provider, this initial triage assessment does not replace that evaluation, and the importance of remaining in the ED until their evaluation is complete.  Abd pain, n/v/d.  Worsening diarrhea, recently taking abx.    Montez Morita, PA-C 04/25/21 1142

## 2021-04-25 NOTE — Telephone Encounter (Signed)
She needs evaluation at ED as previously recommended and reinforced by you.  We are unable to evaluate and treat this by phone

## 2021-04-25 NOTE — Telephone Encounter (Signed)
Pt states she is leaving ED because they "don't care" and she doesn't want to wait any longer. Pt states she went to urgent care and they told her to go to the ED. Advised pt to stay at ED but pt refused to stay. Pt states the lab tried to get her blood but they were unable to, let pt know that this could be from dehydration and she should stay at ED. Pt states she will not stay at ED because "they're not going to do anything anyway because they don't care." Pt states she finished antibiotics 1 week ago and has had diarrhea about 4-5 times a day since then. Pt also reports that she is experiencing nausea and vomiting. Pt denies fever. Pt states she is able to eat and drink. Abd pain is a 5/10. Asked pt where pain was located and pt stated stomach and gallbladder. As DOD please advise.

## 2021-04-25 NOTE — Telephone Encounter (Signed)
Let pt know recommendations. Pt verbalized understanding.

## 2021-04-25 NOTE — ED Triage Notes (Signed)
Pt was at [facility] but left due to long wait. Pt did not not have labs.  Pt reports abdominal pain with vomiting x2 weeks.  She recently completed a course of amoxicillin and has had some diarrhea after that.  Pt reports continued low grade nausea

## 2021-04-25 NOTE — Telephone Encounter (Signed)
Inbound call from patient. States she went to ED for abd pain and she would like a call back to discuss what she can do

## 2021-04-25 NOTE — Telephone Encounter (Signed)
°  Chief Complaint: advice Symptoms: N/V/D abdominal pain Frequency: today Pertinent Negatives: NA Disposition: [x] ED /[] Urgent Care (no appt availability in office) / [] Appointment(In office/virtual)/ []  [facility]/ [] Home Care/ [] Refused Recommended Disposition /[] [facility]/ []  Follow-up with PCP Additional Notes: pt was asking about what she should do since she went to [facility] ED and had an accident and they made fun of her and states she isn't going back to [facility]. Advised her of the different ED locations and pt states she would go to [facility] location.    Reason for Disposition  Health Information question, no triage required and triager able to answer question  Answer Assessment - Initial Assessment Questions 1. REASON FOR CALL or QUESTION: "What is your reason for calling today?" or "How can I best help you?" or "What question do you have that I can help answer?"     Pt asking where to go since she was humiliation at [facility] ED and still having symptoms.  Protocols used: Information Only Call - No Triage-A-AH

## 2021-04-25 NOTE — ED Notes (Signed)
Pt updated.  No distress at this time

## 2021-04-26 NOTE — ED Notes (Signed)
Pt verbalizes understanding of discharge instructions. Opportunity for questioning and answers were provided. Pt discharged from ED to home.   ? ?

## 2021-04-26 NOTE — ED Notes (Signed)
Pt refusing IV after one unsuccessful attempt made by this RN. MD notified

## 2021-05-07 NOTE — Progress Notes (Deleted)
? ? ?  05/07/2021 ?Timberlynn Kizziah ?[id number] ?1995-11-07 ? ? ?ASSESSMENT AND PLAN:  ?*** ?There are no diagnoses linked to this encounter. ? ? ?History of Present Illness:  ?26 y.o. adult  with a past medical history of bipolar affective disorder, major depressive disorder with history of psychotic features and suicidal ideation and others listed below, known to Dr. Myrtie Neither returns to clinic today for evaluation of abdominal pain. ?Last seen in the office 04/12/2019 for generalized abdominal pain with Dr. Myrtie Neither, normal CT, x-ray at the time, thought to be potentially functional.  Possible endoscopic evaluation in the hospital if persists. ?Has needle phobia ?04/26/2021 went to the ER with abdominal pain, also had associated diarrhea, nausea and vomiting.  In the ER had normal lipase, hypokalemia 3.4, white blood cell count 11.9, MCV some microcytosis 77.3, platelets 429.  Patient was on Augmentin secondary to upper URI, thought gastroenteritis secondary to Augmentin. ? ?Previous GI history: ? ?No results found. ? ?Current Medications:  ? ?Current Outpatient Medications (Endocrine & Metabolic):  ?  JUNEL 1.5/30 1.5-30 MG-MCG tablet, Take 1 tablet by mouth daily. ? ? ? ?Current Outpatient Medications (Analgesics):  ?  acetaminophen (TYLENOL) 500 MG tablet, Take 2 tablets (1,000 mg total) by mouth every 8 (eight) hours as needed for moderate pain. ?  naproxen (NAPROSYN) 500 MG tablet, Take 1 tablet (500 mg total) by mouth 2 (two) times daily as needed. ? ? ?Current Outpatient Medications (Other):  ?  erythromycin ophthalmic ointment, Place 1 application into the left eye 3 (three) times daily. ?  FLUoxetine (PROZAC) 20 MG capsule, Take 1 capsule (20 mg total) by mouth daily. For mood/anxiety ?  hydrOXYzine (ATARAX/VISTARIL) 25 MG tablet, Take 1 tablet (25 mg total) by mouth at bedtime. For anxiety ?  pantoprazole (PROTONIX) 40 MG tablet, Take 1 tablet (40 mg total) by mouth daily. For reflux ? ?Surgical History:   ?Montez Morita  has a past surgical history that includes wisdom tooth removal. ?Family History:  ?Corlis Hove Alf's family history includes Diabetes in [place] R. Hartl's maternal grandmother and mother; Hyperlipidemia in [place] R. Grow's maternal grandmother; Hypertension in [place] R. Parr's maternal grandmother. ?Social History:  ? reports that [employer]. Lahman has never smoked. Belenda Cruise. [place] has never used smokeless tobacco. Reita May R. Halls reports that [employer]. Fuster does not drink alcohol and does not use drugs. ? ?Current Medications, Allergies, Past Medical History, Past Surgical History, Family History and Social History were reviewed in [employer] record. ? ?Physical Exam: ?There were no vitals taken for this visit. ?General:   Pleasant, well developed adult in no acute distress ?Eyes: {sclerae:26738},conjunctive {conjuctiva:26739}  ?Heart:  {HEART EXAM HEM/ONC:21750} ?Pulm: Clear anteriorly; no wheezing ?Abdomen:  {BlankSingle:19197::"Distended","Ridged","Soft"}, {BlankSingle:19197::"Flat","Obese"} AB, skin exam {ABDOMEN SKIN EXAM:22649}, {BlankSingle:19197::"Absent","Hyperactive, tinkling","Hypoactive","Sluggish","Normal"} bowel sounds. {Desc; pc desc - abdomen tenderness:5168} tenderness {anatomy; site abdomen:5010}. {BlankMultiple:19196::"Without guarding","With guarding","Without rebound","With rebound"}, {Exam; abdomen organomegaly:15152}. ?Extremities:  {With/Without:304960234} edema. Peripheral pulses intact.  ?Neurologic:  Alert and  oriented x4;  grossly normal neurologically. ?Skin:   Dry and intact without significant lesions or rashes. ?Psychiatric: Demonstrates good judgement and reason without abnormal affect or behaviors. ? ?Doree Albee, PA-C ?05/07/21 ?

## 2021-05-08 ENCOUNTER — Ambulatory Visit: Payer: Medicaid Other | Admitting: Physician Assistant

## 2021-06-18 ENCOUNTER — Other Ambulatory Visit: Payer: Self-pay

## 2021-06-18 ENCOUNTER — Other Ambulatory Visit: Payer: Self-pay | Admitting: Nurse Practitioner

## 2021-06-23 ENCOUNTER — Ambulatory Visit
Admission: RE | Admit: 2021-06-23 | Discharge: 2021-06-23 | Disposition: A | Discharge status: Home / Self Care | Payer: Medicaid Other | Source: Ambulatory Visit | Attending: Nurse Practitioner | Admitting: Nurse Practitioner

## 2021-06-23 DIAGNOSIS — R1011 Right upper quadrant pain: Secondary | ICD-10-CM

## 2021-07-07 ENCOUNTER — Encounter: Payer: Self-pay | Admitting: Neurology

## 2021-07-07 ENCOUNTER — Ambulatory Visit: Payer: Medicaid Other | Admitting: Neurology

## 2021-07-07 VITALS — BP 142/84 | HR 85 | Ht 61.0 in | Wt 351.5 lb

## 2021-07-07 DIAGNOSIS — F5104 Psychophysiologic insomnia: Secondary | ICD-10-CM

## 2021-07-07 DIAGNOSIS — F5105 Insomnia due to other mental disorder: Secondary | ICD-10-CM

## 2021-07-07 DIAGNOSIS — Z9189 Other specified personal risk factors, not elsewhere classified: Secondary | ICD-10-CM | POA: Insufficient documentation

## 2021-07-07 DIAGNOSIS — E669 Obesity, unspecified: Secondary | ICD-10-CM | POA: Insufficient documentation

## 2021-07-07 DIAGNOSIS — R0683 Snoring: Secondary | ICD-10-CM

## 2021-07-07 DIAGNOSIS — F99 Mental disorder, not otherwise specified: Secondary | ICD-10-CM

## 2021-07-07 NOTE — Patient Instructions (Signed)
Insomnia Insomnia is a sleep disorder that makes it difficult to fall asleep or stay asleep. Insomnia can cause fatigue, low energy, difficulty concentrating, mood swings, and poor performance at work or school. There are three different ways to classify insomnia: Difficulty falling asleep. Difficulty staying asleep. Waking up too early in the morning. Any type of insomnia can be long-term (chronic) or short-term (acute). Both are common. Short-term insomnia usually lasts for 3 months or less. Chronic insomnia occurs at least three times a week for longer than 3 months. What are the causes? Insomnia may be caused by another condition, situation, or substance, such as: Having certain mental health conditions, such as anxiety and depression. Using caffeine, alcohol, tobacco, or drugs. Having gastrointestinal conditions, such as gastroesophageal reflux disease (GERD). Having certain medical conditions. These include: Asthma. Alzheimer's disease. Stroke. Chronic pain. An overactive thyroid gland (hyperthyroidism). Other sleep disorders, such as restless legs syndrome and sleep apnea. Menopause. Sometimes, the cause of insomnia may not be known. What increases the risk? Risk factors for insomnia include: Gender. Females are affected more often than males. Age. Insomnia is more common as people get older. Stress and certain medical and mental health conditions. Lack of exercise. Having an irregular work schedule. This may include working night shifts and traveling between different time zones. What are the signs or symptoms? If you have insomnia, the main symptom is having trouble falling asleep or having trouble staying asleep. This may lead to other symptoms, such as: Feeling tired or having low energy. Feeling nervous about going to sleep. Not feeling rested in the morning. Having trouble concentrating. Feeling irritable, anxious, or depressed. How is this diagnosed? This condition  may be diagnosed based on: Your symptoms and medical history. Your health care provider may ask about: Your sleep habits. Any medical conditions you have. Your mental health. A physical exam. How is this treated? Treatment for insomnia depends on the cause. Treatment may focus on treating an underlying condition that is causing the insomnia. Treatment may also include: Medicines to help you sleep. Counseling or therapy. Lifestyle adjustments to help you sleep better. Follow these instructions at home: Eating and drinking  Limit or avoid alcohol, caffeinated beverages, and products that contain nicotine and tobacco, especially close to bedtime. These can disrupt your sleep. Do not eat a large meal or eat spicy foods right before bedtime. This can lead to digestive discomfort that can make it hard for you to sleep. Sleep habits  Keep a sleep diary to help you and your health care provider figure out what could be causing your insomnia. Write down: When you sleep. When you wake up during the night. How well you sleep and how rested you feel the next day. Any side effects of medicines you are taking. What you eat and drink. Make your bedroom a dark, comfortable place where it is easy to fall asleep. Put up shades or blackout curtains to block light from outside. Use a white noise machine to block noise. Keep the temperature cool. Limit screen use before bedtime. This includes: Not watching TV. Not using your smartphone, tablet, or computer. Stick to a routine that includes going to bed and waking up at the same times every day and night. This can help you fall asleep faster. Consider making a quiet activity, such as reading, part of your nighttime routine. Try to avoid taking naps during the day so that you sleep better at night. Get out of bed if you are still awake after   15 minutes of trying to sleep. Keep the lights down, but try reading or doing a quiet activity. When you feel  sleepy, go back to bed. General instructions Take over-the-counter and prescription medicines only as told by your health care provider. Exercise regularly as told by your health care provider. However, avoid exercising in the hours right before bedtime. Use relaxation techniques to manage stress. Ask your health care provider to suggest some techniques that may work well for you. These may include: Breathing exercises. Routines to release muscle tension. Visualizing peaceful scenes. Make sure that you drive carefully. Do not drive if you feel very sleepy. Keep all follow-up visits. This is important. Contact a health care provider if: You are tired throughout the day. You have trouble in your daily routine due to sleepiness. You continue to have sleep problems, or your sleep problems get worse. Get help right away if: You have thoughts about hurting yourself or someone else. Get help right away if you feel like you may hurt yourself or others, or have thoughts about taking your own life. Go to your nearest emergency room or: Call 911. Call the National Suicide Prevention Lifeline at 1-800-273-8255 or 988. This is open 24 hours a day. Text the Crisis Text Line at 741741. Summary Insomnia is a sleep disorder that makes it difficult to fall asleep or stay asleep. Insomnia can be long-term (chronic) or short-term (acute). Treatment for insomnia depends on the cause. Treatment may focus on treating an underlying condition that is causing the insomnia. Keep a sleep diary to help you and your health care provider figure out what could be causing your insomnia. This information is not intended to replace advice given to you by your health care provider. Make sure you discuss any questions you have with your health care provider. Document Revised: 02/03/2021 Document Reviewed: 02/03/2021 Elsevier Patient Education  2023 Elsevier Inc.  
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
## 2021-07-09 ENCOUNTER — Telehealth: Payer: Self-pay

## 2021-07-09 NOTE — Telephone Encounter (Signed)
LVM for pt to call me back to schedule sleep study  

## 2021-07-10 ENCOUNTER — Ambulatory Visit: Payer: Medicaid Other | Admitting: Nurse Practitioner

## 2021-07-10 ENCOUNTER — Encounter: Payer: Self-pay | Admitting: Nurse Practitioner

## 2021-07-10 VITALS — BP 124/80 | HR 81 | Ht 61.75 in | Wt 354.0 lb

## 2021-07-10 DIAGNOSIS — R112 Nausea with vomiting, unspecified: Secondary | ICD-10-CM

## 2021-07-10 DIAGNOSIS — Z6841 Body Mass Index (BMI) 40.0 and over, adult: Secondary | ICD-10-CM | POA: Diagnosis not present

## 2021-07-10 DIAGNOSIS — R1084 Generalized abdominal pain: Secondary | ICD-10-CM | POA: Diagnosis not present

## 2021-07-10 MED ORDER — ONDANSETRON HCL 4 MG PO TABS: 4.0000 mg | ORAL_TABLET | Freq: Three times a day (TID) | ORAL | 3 refills | Status: DC | PRN

## 2021-07-10 NOTE — Progress Notes (Signed)
? ? ?Assessment  ? ?Patient profile:  ?Tamara [name] is a 26 y.o. female known to Dr. Myrtie Neitheranis . Her past medical history is significant for anxiety, depression, ADHD, PTSD, borderline personality disorder, suicidal thoughts, autism, insomnia, GERD, chronic constipation, hypothyroidism, cholelithiasis, morbid obesity. See PMH below for any additional history ? ?Chronic nausea , vomiting, and abdominal pain.  ?Constant generalized abdominal pain, stabbing LUQ / epigastric pain ( mainly when hungry) . Previously seen here for same symptoms but declined upper endoscopy ? ?Chronic constipation with hard, difficult to pass stools.  ? ?GERD, asymptomatic on daily PPI ? ?Extensive psychiatric history  ? ?Plan  ? ?Take 2 stool softeners every night ?Continue drinking at least a liter of water daily ?Start daily Metamucil.  ?If constipation doesn't improve in a few weeks then add 1 capful of Miralax daily ?Zofran 4 mg as needed # 30, 3 refills ?Continue daily Pantoprazole.  ?Try to take Naprosyn as sparingly as possible until GI evaluation complete ?[facility] to evaluate upper abdominal pain, nausea / vomiting ( she is now willing to proceed) though will likely be low yield. The risks and benefits of EGD with possible biopsies were discussed with the patient who agrees to proceed.  ? ?History of Present Illness  ? ?Chief complaint:  nausea, vomiting, abdominal pain, constipation ? ?Tamara [name] was seen by Dr. Myrtie Neitheranis in October 2019 and again February 2021 for chronic abdominal pain .  Her GI issues were felt to be secondary to anxiety/depression/PTSD.  Yield of endoscopic work-up felt to be low.  However, she was offered an upper endoscopy but declined due to fear of being a hard stick, being afraid of needles and sedation ? ?Interval History:  ?PCP has referred Tamara [name] again for abdominal pain, nausea / vomiting.  She describes the same pain as when seen here previously. The pain encompasses  her whole abdomen. The pain is nearly constant  and described as a dull ache. However when she is hungry she has sharp pain in LUQ pain / epigastrium and this pain is relieved by eating . She continues to struggle with frequent episodes of nausea and vomiting several times a week. She takes Naprosyn several times a week.  ? ?Tamara [name] has chronic constipation. Stools are hard and difficult to pass.  Stool softeners help when she takes them. No blood in stool.  ? ?Tamara [name] complains of excessive weight gain.  Last year she went to a weight loss center and was put on what sounds like a high protein diet but had a lot of "violent episodes" with acting out.  ? ?Previous Labs / Imaging:: ? ?  Latest Ref Rng & Units 04/25/2021  ?  7:50 PM 03/27/2021  ?  9:27 PM 01/31/2021  ? 10:38 AM  ?CBC  ?WBC 4.0 - 10.5 K/uL 11.9   19.2   13.9    ?Hemoglobin 12.0 - 15.0 g/dL 81.112.1   91.411.5   78.211.4    ?Hematocrit 36.0 - 46.0 % 38.5   37.1   37.0    ?Platelets 150 - 400 K/uL 429   474   450    ? ? ?Lab Results  ?Component Value Date  ? LIPASE <10 (L) 04/25/2021  ? ? ?  Latest Ref Rng & Units 04/25/2021  ?  7:50 PM 03/27/2021  ?  9:27 PM 01/31/2021  ? 10:38 AM  ?CMP  ?Glucose 70 - 99 mg/dL 956125   213138   87    ?  BUN 6 - 20 mg/dL 9   15   9     ?Creatinine 0.44 - 1.00 mg/dL   9.93   5.70    ?Sodium 135 - 145 mmol/L 137   137   138    ?Potassium 3.5 - 5.1 mmol/L 3.4   3.9   3.9    ?Chloride 98 - 111 mmol/L 105   105   105    ?CO2 22 - 32 mmol/L 21   24   28     ?Calcium 8.9 - 10.3 mg/dL 8.8   9.0   9.0    ?Total Protein 6.5 - 8.1 g/dL 7.2   7.9   7.5    ?Total Bilirubin 0.3 - 1.2 mg/dL 0.5   0.3   0.7    ?Alkaline Phos 38 - 126 U/L 103   131   109    ?AST 15 - 41 U/L 14   17   16     ?ALT 0 - 44 U/L 19   19   21     ? ? ? ?Previous GI Evaluations  ? ?Endoscopies: ?None ? ? ?Imaging:  ?02/06/17 Ultrasound ?Small amount of sludge present within the gallbladder lumen. Single 9 mm stone present near the gallbladder neck. Phrygian cap noted. ?Gallbladder wall  measured within normal limits at 2.8 mm. No free ?pericholecystic fluid. No sonographic Murphy sign elicited on exam. ?  ?Common bile duct: ?  ?Diameter: 3.8 mm ?  ?Liver: ?  ?No focal lesion identified. Within normal limits in parenchymal ?echogenicity. Portal vein is patent on color Doppler imaging with ?normal direction of blood flow towards the liver. ?  ?IMPRESSION: ?1. Stones and sludge within the gallbladder lumen. No sonographic ?features to suggest acute cholecystitis. ?2. No biliary dilatation. ? ?02/04/17 CTAP wo contrast ?IMPRESSION: ?1. No acute finding. ?2. Cholelithiasis without changes of cholecystitis. ?3. Motion degraded to a degree that findings could be obscure ? ?04/10/19 DG ABDOMEN ACUTE W/ 1V CHEST ? FINDINGS: ?The upright chest x-ray demonstrates normal cardiomediastinal ?contours. The lungs are clear. No pleural effusions. ?  ?The abdominal bowel gas pattern is unremarkable. No findings for ?small bowel obstruction or free air. No significant stool burden. ?The soft tissue shadows are maintained. No worrisome calcifications. ?The bony structures are intact. ?  ?IMPRESSION: ?1. No acute cardiopulmonary findings. ?2. Unremarkable abdominal bowel gas pattern. ?  ? Abdomen Complete ?CLINICAL DATA:  Recurrent right upper quadrant abdominal pain ? ?EXAM: ?ABDOMEN ULTRASOUND COMPLETE ? ?COMPARISON:  Ultrasound dated February 06, 2017; CT dated November ?29, 2018. ? ?FINDINGS: ?Gallbladder: No gallstones or wall thickening visualized. Wall ?measures within normal limits within remeasured. Gallbladder is ?partially contracted. No sonographic Murphy sign noted by ?sonographer. ? ?Common bile duct: Diameter: 6 mm, within normal limits ? ?Liver: No suspicious focal lesion identified. Incidental note of an ?echogenic focus most consistent with a coarse calcification in the ?RIGHT liver measuring 7 mm. This likely reflects a sequela of remote ?prior granulomatous infection. Diffusely increased  hepatic ?parenchymal echogenicity. Portal vein is patent on color Doppler ?imaging with normal direction of blood flow towards the liver. ? ?IVC: No abnormality visualized. ? ?Pancreas: Visualized portion unremarkable. ? ?Spleen: Size and appearance within normal limits. ? ?Right Kidney: Length: 12.1 cm. Echogenicity within normal limits. No ?mass or hydronephrosis visualized. ? ?Left Kidney: Length: 12.1 cm. Echogenicity within normal limits. No ?mass or hydronephrosis visualized. ? ?Abdominal aorta: No aneurysm visualized. ? ?Other findings: None. ? ?IMPRESSION: ?1. Gallbladder is contracted. Previously  described cholelithiasis is ?not visualized on this exam. No sonographic evidence of acute ?cholecystitis. If concern for biliary dyskinesia, a dedicated HIDA ?scan with ejection fraction could be considered for further ?evaluation. ?2. Hepatic steatosis. ? ?Electronically Signed ?  By: Meda Klinefelter M.D. ?  On: 06/23/2021 17:01 ? ? ? ?Past Medical History:  ?Diagnosis Date  ? ADHD   ? Autism   ? Bipolar disorder (HCC)   ? Chronic back pain   ? "mostly lower; sometimes all over" (03/22/2017)  ? Chronic stomach ulcer   ? Depression   ? GERD (gastroesophageal reflux disease)   ? Headache   ? "a few/week" (03/22/2017)  ? Migraine   ? "monthly recently" (03/22/2017)  ? Personality disorder (HCC)   ? PTSD (post-traumatic stress disorder)   ? Seizures (HCC)   ? "very random; I lose memory of what happens; usually happens when I'm in bed; probably 2-3/year" (03/22/2017)  ? ?Past Surgical History:  ?Procedure Laterality Date  ? wisdom tooth removal    ? ?Family History  ?Problem Relation Age of Onset  ? Diabetes Mother   ? Hypertension Maternal Grandmother   ? Hyperlipidemia Maternal Grandmother   ? Diabetes Maternal Grandmother   ? ?Social History  ? ?Tobacco Use  ? Smoking status: Never  ? Smokeless tobacco: Never  ?Vaping Use  ? Vaping Use: Never used  ?Substance Use Topics  ? Alcohol use: No  ?  Alcohol/week: 0.0  standard drinks  ? Drug use: No  ? ?Current Outpatient Medications  ?Medication Sig Dispense Refill  ? acetaminophen (TYLENOL) 500 MG tablet Take 2 tablets (1,000 mg total) by mouth every 8 (eight) hours as needed for

## 2021-07-10 NOTE — Patient Instructions (Addendum)
Take 2 stool softeners every night ?Continue drinking at least a liter of water daily ?Start daily Metamucil.  ?If constipation doesn't improve in a few weeks then add 1 capful of Miralax daily ?Zofran 4 mg as needed # 30, 3 refills ?Continue daily Pantoprazole.  ?Try to take Naprosyn as sparingly as possible until GI evaluation complete ?Will arrange for EGD to be done at [facility] to evaluate upper abdominal pain, nausea / vomiting ( she is now willing to proceed). The risks and benefits of EGD with possible biopsies were discussed with the patient who agrees to proceed.  ? ?We have sent the following medications to your pharmacy for you to pick up at your convenience: Zofran ? ? ?You have been scheduled for a EGD at [facility], separate instructions have been given ? ?Due to recent changes in healthcare laws, you may see the results of your imaging and laboratory studies on MyChart before your provider has had a chance to review them.  We understand that in some cases there may be results that are confusing or concerning to you. Not all laboratory results come back in the same time frame and the provider may be waiting for multiple results in order to interpret others.  Please give [place] 48 hours in order for your provider to thoroughly review all the results before contacting the office for clarification of your results.   ? ?I appreciate the  opportunity to care for you ? ?Thank You  ? ?Midge Minium  ?

## 2021-07-14 NOTE — Progress Notes (Signed)
____________________________________________________________ ? ?Attending physician addendum: ? ?Thank you for sending this case to me. ?I have reviewed the entire note and agree with the plan. ? ?Despite having nausea and vomiting, the diffuse nature of the abdominal pain along with chronic constipation is not typical for biliary colic. ?EGD reasonable.  Would not recommend use of [employer] given her affective disorder. ? ?Amada Jupiter, MD ? ?____________________________________________________________ ? ?

## 2021-07-16 ENCOUNTER — Ambulatory Visit: Payer: Medicaid Other | Admitting: Neurology

## 2021-07-16 DIAGNOSIS — G4733 Obstructive sleep apnea (adult) (pediatric): Secondary | ICD-10-CM

## 2021-07-16 DIAGNOSIS — E669 Obesity, unspecified: Secondary | ICD-10-CM

## 2021-07-16 DIAGNOSIS — F5104 Psychophysiologic insomnia: Secondary | ICD-10-CM

## 2021-07-16 DIAGNOSIS — F5105 Insomnia due to other mental disorder: Secondary | ICD-10-CM

## 2021-07-16 DIAGNOSIS — R0683 Snoring: Secondary | ICD-10-CM

## 2021-07-16 DIAGNOSIS — Z9189 Other specified personal risk factors, not elsewhere classified: Secondary | ICD-10-CM

## 2021-07-21 ENCOUNTER — Encounter (HOSPITAL_COMMUNITY): Payer: Self-pay | Admitting: Gastroenterology

## 2021-07-21 NOTE — Progress Notes (Signed)
[facility]   HOME SLEEP TEST REPORT ( by Watch PAT)   STUDY DATE:  07-21-2021   ORDERING CLINICIAN: Melvyn Novas, MD  REFERRING CLINICIAN: Levonne Lapping, NP. Tamara [name]   CLINICAL INFORMATION/HISTORY: Patient is referred to evaluate for organic sleep disorder. Nonbinary person , age 26, reporting extremely high fatigue level and chronic insomnia following more Hypersomnia in her teenage- now cyclic and moderate sleepiness in daytime.  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    Epworth sleepiness score: 8-9 /24. FSS at 63/ 63 points.    BMI: 66.2kg/m   Neck Circumference: 18"   FINDINGS:   Sleep Summary:   Total Recording Time (hours, min): 8 hours 49 minutes of which the total sleep time was calculated at 4 hours 23 minutes.   There were 14.8% REM sleep recorded.                                       Respiratory Indices:   Calculated pAHI (per hour):    19.8/h                         REM pAHI: 40.7/h                                               NREM pAHI: 16.2/h                              Positional AHI: Nonsupine sleep was associated with an AHI of 22.3 and supine sleep with an AHI of 12.1/h.  Of all nonsupine sleep position the lowest AHI was seen in association with left-sided sleep AHI was 5.1 total sleep time 47 minutes.  The highest AHI was associated with sleep on the right side at 32.5/h.  The mean level of snoring was loud 49 dB and accompanied 70% of total sleep time over 120 minutes of sleep were associated with over 50 dB snoring and about 15 minutes with over 60 dB snoring.                                                  Oxygen Saturation Statistics:        O2 Saturation Range (%):   Between a nadir at 77% and a maximum of 100% oxygen saturation and a mean saturation of 95%.                                    O2 Saturation (minutes) <89%: 7.4 minutes          Pulse Rate Statistics:   Pulse Mean (bpm): 93 bpm               Pulse Range: Between 67 and 119 bpm               IMPRESSION:  This HST confirms the presence of moderate sleep apnea versus obstructive sleep apnea with  a strong REM sleep dependence and positional variability but there was no significant oxygen desaturation by duration noted.  Loud snoring is present.  It was also evident that the patient very very frequently changed their sleep position.   RECOMMENDATION: I am not sure how much this sleep apnea contributes to the fragmentation of sleep but it certainly would not contribute to a delayed sleep onset.  Given the patient's body mass index and loud snoring that person would be best treated by positive airway pressure therapy. Recommend to start with either an in-lab titration which also would allow to change to BiPAP and makes mask fitting easier, and I do wonder if the home sleep test did not capture a certain amount of hypopnea associated with obesity hypoventilation.  An attended sleep study could give further information about this also about restless legs or PLM's contributing to sleep fragmentation.  If insurance does not allow [place] to do the step in lab then my next step would be an autotitration device with heated humidification , pressure setting between 5 and 20 cmH2O, 2 cm EPR.  [employer] or a ResMed device.  I would recommend to fit with a nasal pillow or nasal cradle before trying a FFM . Thank you, RPSGT !    INTERPRETING PHYSICIAN:  Melvyn Novas, MD   Medical Director of [facility] at [facility].

## 2021-07-27 NOTE — Addendum Note (Signed)
Addended by: Melvyn Novas on: 07/27/2021 03:31 PM   Modules accepted: Orders

## 2021-07-27 NOTE — Procedures (Signed)
[facility] TEST REPORT ( by Watch PAT)   STUDY DATE:  07-21-2021   ORDERING CLINICIAN: Larey Seat, MD  REFERRING CLINICIAN: Dianna Rossetti, NP. Tamara [name]   CLINICAL INFORMATION/HISTORY: Patient is referred to evaluate for organic sleep disorder. Nonbinary person , age 26, reporting extremely high fatigue level and chronic insomnia following more Hypersomnia in her teenage- now cyclic and moderate sleepiness in daytime.  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    Epworth sleepiness score: 8-9 /24. FSS at 63/ 63 points.    BMI: 66.2kg/m   Neck Circumference: 18"   FINDINGS:   Sleep Summary:   Total Recording Time (hours, min): 8 hours 49 minutes of which the total sleep time was calculated at 4 hours 23 minutes.   There were 14.8% REM sleep recorded.                                       Respiratory Indices:   Calculated pAHI (per hour):    19.8/h                         REM pAHI: 40.7/h                                               NREM pAHI: 16.2/h                              Positional AHI: Nonsupine sleep was associated with an AHI of 22.3 and supine sleep with an AHI of 12.1/h.  Of all nonsupine sleep position the lowest AHI was seen in association with left-sided sleep AHI was 5.1 total sleep time 47 minutes.  The highest AHI was associated with sleep on the right side at 32.5/h.  The mean level of snoring was loud 49 dB and accompanied 70% of total sleep time over 120 minutes of sleep were associated with over 50 dB snoring and about 15 minutes with over 60 dB snoring.                                                  Oxygen Saturation Statistics:        O2 Saturation Range (%):   Between a nadir at 77% and a maximum of 100% oxygen saturation and a mean saturation of 95%.                                    O2 Saturation (minutes) <89%: 7.4 minutes         Pulse Rate  Statistics:   Pulse Mean (bpm): 93 bpm               Pulse Range: Between 67 and 119 bpm               IMPRESSION:  This HST confirms the presence of moderate sleep apnea versus obstructive sleep apnea with a strong  REM sleep dependence and positional variability but there was no significant oxygen desaturation by duration noted.  Loud snoring is present.  It was also evident that the patient very very frequently changed their sleep position.   RECOMMENDATION: I am not sure how much this sleep apnea contributes to the fragmentation of sleep but it certainly would not contribute to a delayed sleep onset.  Given the patient's body mass index and loud snoring that person would be best treated by positive airway pressure therapy. Recommend to start with either an in-lab titration which also would allow to change to BiPAP and makes mask fitting easier, and I do wonder if the home sleep test did not capture a certain amount of hypopnea associated with obesity hypoventilation.  An attended sleep study could give further information about this also about restless legs or PLM's contributing to sleep fragmentation.  If insurance does not allow [place] to do the step in lab then my next step would be an autotitration device with heated humidification , pressure setting between 5 and 20 cmH2O, 2 cm EPR.  [employer] or a ResMed device.  I would recommend to fit with a nasal pillow or nasal cradle before trying a FFM . Thank you, RPSGT !    INTERPRETING PHYSICIAN:  Larey Seat, MD   Medical Director of [facility] at [facility].    Sherryle Lis, [place] Nurse Tech     07/21/2021   Edit Copy       [facility] TEST REPORT ( by Watch PAT)   STUDY DATE:  07-21-2021   ORDERING CLINICIAN: Larey Seat, MD   REFERRING CLINICIAN: Dianna Rossetti, NP. Tamara [name]   CLINICAL INFORMATION/HISTORY: Patient is referred to evaluate for organic sleep disorder. Nonbinary person , age  26, reporting extremely high fatigue level and chronic insomnia following more Hypersomnia in her teenage- now cyclic and moderate sleepiness in daytime.  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    Epworth sleepiness score: 8-9 /24. FSS at 63/ 63 points.    BMI: 66.2kg/m   Neck Circumference: 18"   FINDINGS:   Sleep Summary:   Total Recording Time (hours, min): 8 hours 49 minutes of which the total sleep time was calculated at 4 hours 23 minutes.   There were 14.8% REM sleep recorded.                                       Respiratory Indices:   Calculated pAHI (per hour):    19.8/h                         REM pAHI: 40.7/h                                               NREM pAHI: 16.2/h                              Positional AHI: Nonsupine sleep was associated with an AHI of 22.3 and supine sleep with an AHI of 12.1/h.  Of all nonsupine sleep position the lowest AHI was seen in association with  left-sided sleep AHI was 5.1 total sleep time 47 minutes.  The highest AHI was associated with sleep on the right side at 32.5/h.  The mean level of snoring was loud 49 dB and accompanied 70% of total sleep time over 120 minutes of sleep were associated with over 50 dB snoring and about 15 minutes with over 60 dB snoring.                                                  Oxygen Saturation Statistics:        O2 Saturation Range (%):   Between a nadir at 77% and a maximum of 100% oxygen saturation and a mean saturation of 95%.                                    O2 Saturation (minutes) <89%: 7.4 minutes         Pulse Rate Statistics:   Pulse Mean (bpm): 93 bpm               Pulse Range: Between 67 and 119 bpm               IMPRESSION:  This HST confirms the presence of moderate sleep apnea versus obstructive sleep apnea with a strong REM sleep dependence and positional variability but there was no significant  oxygen desaturation by duration noted.  Loud snoring is present.  It was also evident that the patient very very frequently changed their sleep position.   RECOMMENDATION: I am not sure how much this sleep apnea contributes to the fragmentation of sleep but it certainly would not contribute to a delayed sleep onset.  Given the patient's body mass index and loud snoring that person would be best treated by positive airway pressure therapy. Recommend to start with either an in-lab titration which also would allow to change to BiPAP and makes mask fitting easier, and I do wonder if the home sleep test did not capture a certain amount of hypopnea associated with obesity hypoventilation.  An attended sleep study could give further information about this also about restless legs or PLM's contributing to sleep fragmentation.  If insurance does not allow [place] to do the step in lab then my next step would be an autotitration device with heated humidification , pressure setting between 5 and 20 cmH2O, 2 cm EPR.  [employer] or a ResMed device.  I would recommend to fit with a nasal pillow or nasal cradle before trying a FFM . Thank you, RPSGT !     INTERPRETING PHYSICIAN:   Larey Seat, MD    Medical Director of [facility] at [facility].

## 2021-07-29 ENCOUNTER — Other Ambulatory Visit: Payer: Self-pay | Admitting: Neurology

## 2021-07-29 ENCOUNTER — Encounter: Payer: Self-pay | Admitting: Neurology

## 2021-07-29 ENCOUNTER — Telehealth: Payer: Self-pay | Admitting: *Deleted

## 2021-07-29 DIAGNOSIS — Z9189 Other specified personal risk factors, not elsewhere classified: Secondary | ICD-10-CM

## 2021-07-29 DIAGNOSIS — F5104 Psychophysiologic insomnia: Secondary | ICD-10-CM

## 2021-07-29 DIAGNOSIS — E669 Obesity, unspecified: Secondary | ICD-10-CM

## 2021-07-29 DIAGNOSIS — R0683 Snoring: Secondary | ICD-10-CM

## 2021-07-29 NOTE — Telephone Encounter (Signed)
LVM for pt to call about results. °

## 2021-07-29 NOTE — Telephone Encounter (Signed)
----- Message from Melvyn Novas, MD sent at 07/27/2021  3:31 PM EDT -----   IMPRESSION:  This HST confirms the presence of moderate sleep apnea versus obstructive sleep apnea with a strong REM sleep dependence and positional variability but there was no significant oxygen desaturation by duration noted.  Loud snoring is present.   It was also evident that the patient very, very frequently changed their sleep position.  RECOMMENDATION: I am not sure how much this sleep apnea contributes to the fragmentation of sleep but it certainly would not contribute to a delayed sleep onset.  Given the patient's body mass index and loud snoring that person would be best treated by positive airway pressure therapy. Recommend to start with either an in-lab titration which also would allow to change to BiPAP and makes mask fitting easier, and I do wonder if the home sleep test did not capture a certain amount of hypopnea associated with obesity hypoventilation.  An attended sleep study could give further information about this also about restless legs or PLM's contributing to sleep fragmentation.  If insurance does not allow [place] to do the step in lab then my next step would be an autotitration device with heated humidification , pressure setting between 5 and 20 cmH2O, 2 cm EPR.  [employer] or a ResMed device.  I would recommend to fit with a nasal pillow or nasal cradle before trying a FFM . Thank you, RPSGT !   INTERPRETING PHYSICIAN:  Melvyn Novas, MD   Medical Director of [facility] at [facility].    Colen Darling, [place] Nurse Tech    07/21/2021       [facility]  HOME SLEEP TEST REPORT ( by Watch PAT)  STUDY DATE:07-21-2021  ORDERING CLINICIAN:Carmen Dohmeier, MD  REFERRING CLINICIAN:Samandra Tann, NP. [place]  CLINICAL INFORMATION/HISTORY:Patient is referred to evaluate for organic sleep disorder. Nonbinary person , age 26, reporting extremely high  fatigue level and chronic insomnia following more Hypersomnia in her teenage- now cyclic and moderate sleepiness in daytime.  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  Epworth sleepiness score:8-9/24.FSS at 63/ 63 points.  BMI:66.2kg/m  Neck Circumference:18"  FINDINGS:  Sleep Summary:  Total Recording Time (hours, min):8 hours 49 minutes of which the total sleep time was calculated at 4 hours 23 minutes.  There were 14.8% REM sleep recorded.   Respiratory Indices:  Calculated pAHI (per hour):19.8/h  REM pAHI:40.7/h  NREM pAHI:16.2/h  PositionalAHI: Nonsupine sleep was associated with an AHI of 22.3 and supine sleep with an AHI of 12.1/h. Of all nonsupine sleep position the lowest AHI was seen in association with left-sided sleep AHI was 5.1 total sleep time 47 minutes. The highest AHI was associated with sleep on the right side at 32.5/h.  The mean level of snoring was loud 49 dB and accompanied 70% of total sleep time over 120 minutes of sleep were associated with over 50 dB snoring and about 15 minutes with over 60 dB snoring.  Oxygen Saturation Statistics:  O2 Saturation Range (%):Between a nadir at 77% and a maximum of 100% oxygen saturationanda mean saturation of 95%.  O2 Saturation (minutes) <89%:7.4 minutes  Pulse Rate Statistics:  Pulse Mean (bpm):93 bpm  Pulse Range:Between 67 and 119 bpm  IMPRESSION: This HST confirms the presence of moderate sleep apnea versus obstructive sleep apnea with a strong REM sleep dependence and positional variability but there was no significant oxygen desaturation  by duration  noted. Loud snoring is present. It was also evident that the patient very very frequently changed their sleep position.  RECOMMENDATION:I am not sure how muchthissleep apnea contributes to the fragmentation of sleep but itcertainly would not contribute to a delayed sleep onset. Given the patient's body mass index and loud snoring that person would be best treated by positive airway pressure therapy. Recommend to start with either an in-lab titration which also would allow to change to [facility] fitting easier, andI do wonder if the home sleep test did not capture a certain amount of hypopnea associated with obesity hypoventilation. An attended sleep study could give further information about this also about restless legs or PLM's contributing to sleep fragmentation.  If insurance does not allow [place] to do the step in lab then my next step would be an autotitration device with heated humidification ,pressure setting between 5 and 20 cmH2O, 2 cm EPR.[employer] or a ResMed device. I would recommend to fitwith a nasal pillow or nasal cradle before trying a FFM . Thank you, RPSGT !   INTERPRETING PHYSICIAN:  Melvyn Novas, MD   Medical Director of [facility] Sleep at [facility].                                                    Last Resulted: 07/16/21 13:00    Order Details   View Encounter   Lab and Collection Details   Routing   Result History  View Encounter Conversation    Result Care Coordination   Patient Communication    07/27/2021 4:25 PM Release Now  Not seen Back to Top    Tamara [name] (MR# [id number])

## 2021-08-01 NOTE — Telephone Encounter (Signed)
She needs to have a care partner with her, even if that person takes Medicaid transportation along with her to ensure she gets home safely. If that is not available for June 5, then the procedure should be rescheduled to a day when that will be feasible.  HD

## 2021-08-05 ENCOUNTER — Encounter: Payer: Self-pay | Admitting: Neurology

## 2021-08-11 ENCOUNTER — Ambulatory Visit (HOSPITAL_COMMUNITY): Admission: RE | Admit: 2021-08-11 | Payer: Medicaid Other | Source: Ambulatory Visit | Admitting: Gastroenterology

## 2021-08-11 ENCOUNTER — Encounter (HOSPITAL_COMMUNITY): Admission: RE | Payer: Self-pay | Source: Ambulatory Visit

## 2021-08-11 SURGERY — ESOPHAGOGASTRODUODENOSCOPY (EGD) WITH PROPOFOL
Anesthesia: Monitor Anesthesia Care

## 2021-08-28 ENCOUNTER — Encounter: Payer: Self-pay | Admitting: Neurology

## 2021-08-28 ENCOUNTER — Telehealth: Payer: Self-pay | Admitting: Neurology

## 2021-08-28 NOTE — Telephone Encounter (Signed)
Noted patient is doing everything right. She can best address this concern with [facility] on 09/01/21. I will send a mychart message to the patient to inform the pt of this.

## 2021-08-28 NOTE — Telephone Encounter (Signed)
Pt was scheduled for Initial CPAP visit on 10/20/21. Pt was informed to bring machine and power cord to appt. DME: [facility] Phone: [phone] Fax: [phone] Equipment issued: Lolita Cram 10 Pt to be schedule between: 09/20/21-11/20/21

## 2021-08-28 NOTE — Telephone Encounter (Signed)
Pt said, mask for CPAP machine causing a itchy rash on the face where mask touches skin. Mask comes off while sleeping from moving around a lot. Contacted [facility] 08/26/21; was told to come to the [facility] office on 09/01/21. Have not been using the CPAP machine. Would like a call from the nurse.  Have scheduled pt's Initial CPAP on 10/20/21

## 2021-10-08 ENCOUNTER — Other Ambulatory Visit: Payer: Self-pay | Admitting: Physician Assistant

## 2021-10-08 DIAGNOSIS — J358 Other chronic diseases of tonsils and adenoids: Secondary | ICD-10-CM
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
## 2021-10-20 ENCOUNTER — Encounter: Payer: Self-pay | Admitting: Adult Health

## 2021-10-20 ENCOUNTER — Ambulatory Visit: Payer: Medicaid Other | Admitting: Adult Health
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
## 2021-10-23 ENCOUNTER — Telehealth: Payer: Self-pay

## 2021-10-23 ENCOUNTER — Encounter: Payer: Self-pay | Admitting: Adult Health

## 2021-10-23 ENCOUNTER — Ambulatory Visit: Payer: Medicaid Other | Admitting: Adult Health

## 2021-10-23 VITALS — BP 143/104 | HR 90 | Ht 61.0 in | Wt 349.4 lb

## 2021-10-23 DIAGNOSIS — G4733 Obstructive sleep apnea (adult) (pediatric): Secondary | ICD-10-CM

## 2021-10-23 DIAGNOSIS — Z6841 Body Mass Index (BMI) 40.0 and over, adult: Secondary | ICD-10-CM

## 2021-10-23 DIAGNOSIS — Z9989 Dependence on other enabling machines and devices: Secondary | ICD-10-CM

## 2021-10-23 DIAGNOSIS — F419 Anxiety disorder, unspecified: Secondary | ICD-10-CM

## 2021-10-23 NOTE — Patient Instructions (Signed)
Increase use of CPAP trying to use nightly greater than 4 hours per night  Ensure you follow up with your psychiatrist regarding anxiety issues    Follow up in 6 months or call earlier if needed

## 2021-10-23 NOTE — Telephone Encounter (Signed)
Faxed over Cpap order to [facility]. Confirmation received. 10/23/2021

## 2021-11-04 ENCOUNTER — Inpatient Hospital Stay: Admission: RE | Admit: 2021-11-04 | Payer: Medicaid Other | Source: Ambulatory Visit

## 2021-11-05 ENCOUNTER — Telehealth: Payer: Self-pay | Admitting: Neurology

## 2021-11-05 NOTE — Telephone Encounter (Signed)
Received a notification from [place] that the patient has now met compliance. Set up 08/20/21 and and already completed office visit. Since the ov she reached compliance and [facility] are asking the notes be addended to reflect that she has met compliance in the first 90 days.

## 2021-11-05 NOTE — Telephone Encounter (Signed)
Made addendum to prior OV note as requested.

## 2021-11-05 NOTE — Telephone Encounter (Signed)
Notes printed and faxed to [facility] as requested and received confirmation of fax

## 2021-12-09 ENCOUNTER — Encounter: Payer: Self-pay | Admitting: Sports Medicine

## 2021-12-09 ENCOUNTER — Ambulatory Visit (INDEPENDENT_AMBULATORY_CARE_PROVIDER_SITE_OTHER): Payer: Medicaid Other | Admitting: Sports Medicine

## 2021-12-09 VITALS — Ht 61.0 in | Wt 352.0 lb

## 2021-12-09 DIAGNOSIS — R52 Pain, unspecified: Secondary | ICD-10-CM | POA: Diagnosis not present

## 2021-12-09 DIAGNOSIS — Z6841 Body Mass Index (BMI) 40.0 and over, adult: Secondary | ICD-10-CM

## 2021-12-09 DIAGNOSIS — E039 Hypothyroidism, unspecified: Secondary | ICD-10-CM

## 2021-12-09 DIAGNOSIS — M2550 Pain in unspecified joint: Secondary | ICD-10-CM | POA: Diagnosis not present

## 2021-12-09 DIAGNOSIS — R635 Abnormal weight gain: Secondary | ICD-10-CM | POA: Diagnosis not present

## 2021-12-09 NOTE — Progress Notes (Signed)
Complaining of all over "bone pain" Preventing her from exercising Chronic issue; years of pain  Denies any x-rays Tylenol for pain; barely touches the pain

## 2021-12-09 NOTE — Progress Notes (Signed)
Tamara [name] - 26 y.o. adult MRN [id number]  Date of birth: Oct 17, 1995  Office Visit Note: Visit Date: 12/09/2021 PCP: Dianna Rossetti, NP Referred by: Dianna Rossetti, NP  Subjective: No chief complaint on file.  HPI: Tamara [name] is a pleasant 26 y.o. adult who presents today for total body pain.  She has pain all throughout the body, although her knees and her back are the worst.  She also reports pain in the forearms and the wrists.  Denies any specific injury.  She does note that she has gained almost 100 pounds over the last year.  She does have a history of hypothyroid condition, managed on 25 mg of Synthroid.  She has been seen by her PCP for this.  She has not taking any medications.  She does have prior knee and lumbar spine x-rays from July 2020 which were unremarkable.  History of hypothyroidism.  She also sees a psychiatrist and takes [place] for reported hallucinations.  Also on Wellbutrin.   She denies any personal history of rheumatologic arthropathy.  She does report a history of rheumatoid arthritis on her mom side of the family.  Her mother and grandmother also have arthritis, although she is unsure which kind.  Tamara [name] does report a history of morning stiffness all throughout the body.  Pertinent ROS were reviewed with the patient and found to be negative unless otherwise specified above in HPI.   Assessment & Plan: Visit Diagnoses:  1. Total body pain   2. Weight gain   3. Arthralgia, unspecified joint   4. Hypothyroidism, unspecified type   5. Class 3 severe obesity with body mass index (BMI) of 60.0 to 69.9 in adult, unspecified obesity type, unspecified whether serious comorbidity present [facility])    Plan: I had a lengthy discussion with Tamara [name] today regarding their symptoms.  Saraiyah has total body pain, I cannot see any evidence of joint effusions on exam today.  May have a degree of dactylitis of the fingers.  There is a family history of  rheumatoid arthritis.  Given this, her multiple joint pain and morning stiffness I think it is prudent to rule out any rheumatologic condition.  I also discussed with Tamara [name] that abnormal thyroid can most certainly cause joint and muscle pain.  I cannot see any evidence of recent labs from thyroid, we will repeat this as well today.  Tamara [name] is managed on Invega as well through psychiatry, this is known to cause weight gain but I would like we need to have a discussion with the psychiatrist if there is an alternative medication that is more weight neutral.  I would like Tamara [name] to continue to be active, we discussed slowly increasing physical activity with 50 more steps each week from baseline of 3000 steps a day.  We will follow-up in 2 weeks to review blood work, if this is benign we will discuss appropriate physical activity and weight management.  Did briefly discuss a referral to a nutritionist/dietitian or the [place] weight loss management program.  Recommended heat, may take the occasional Tylenol or Aleve for overall joint pains.  Follow-up: Return in about 2 weeks (around 12/23/2021) for to review blood work.   Meds & Orders: No orders of the defined types were placed in this encounter.   Orders Placed This Encounter  Procedures   CBC with Differential   TSH   Sed Rate (ESR)   Antinuclear Antib (ANA)   Rheumatoid Factor     Procedures: No procedures  performed      Clinical History: No specialty comments available.  Tamara [name] reports that [employer]. [place] has never smoked. Tamara [name]. Joe has never used smokeless tobacco. No results for input(s): "HGBA1C", "LABURIC" in the last 8760 hours.  Objective:   Vital Signs: Ht $RemoveB'5\' 1"'YYSKZTfl$  (1.549 m)   Wt (!) 352 lb (159.7 kg)   BMI 66.51 kg/m   Physical Exam  Gen: Well-appearing, in no acute distress; non-toxic CV: Regular Rate. Well-perfused. Warm.  Resp: Breathing unlabored on room air; no wheezing. Psych:  Fluid speech in conversation; appropriate affect; normal thought process Neuro: Sensation intact throughout. No gross coordination deficits.   Ortho Exam -Bilateral knees: Examination of the knees demonstrates no gross effusion or redness.  Range of motion preserved from 0-130 degrees.  She has global TTP throughout the medial and lateral aspects of the knee joint, right worse than left.  No varus or valgus instability.  Calves are soft and nontender bilaterally.  Imaging:  Independent review of both complete 4 view knee x-rays from the right and left knee from 09/19/2018 were interpreted by myself.  X-rays demonstrate normal alignment without evidence of fracture or bony abnormality.  There is preserved joint spaces.  Bone mineralization appears within normal limits given the patient's age.  CLINICAL DATA:  26 year old female with syncope and fall. Pain.   EXAM: LEFT KNEE - COMPLETE 4+ VIEW   COMPARISON:  None.   FINDINGS: The lateral view is oblique. No joint effusion is evident. Joint spaces and alignment appear normal. Bone mineralization is within normal limits. No osseous abnormality identified. No discrete soft tissue injury.   IMPRESSION: Negative.  EXAM: RIGHT KNEE - COMPLETE 4+ VIEW  COMPARISON: None.  FINDINGS: Bone mineralization is within normal limits. No evidence of fracture, dislocation, or joint effusion. No evidence of arthropathy or other focal bone abnormality. Soft tissues are unremarkable.  IMPRESSION: Negative.   Electronically Signed By: Genevie Ann M.D. On: 09/19/2018 19:19      Electronically Signed   By: Genevie Ann M.D.   On: 09/19/2018 19:  Past Medical/Family/Surgical/Social History: Medications & Allergies reviewed per EMR, new medications updated. 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/17/2018   Bipolar 1 disorder ([place]) 08/25/2018   MDD (major depressive disorder), single episode, severe with psychotic features ([place]) 07/27/2018   Bipolar affective disorder, depressed, moderate ([place]) 07/09/2018   Borderline personality disorder ([place]) 12/20/2016   Past Medical History:  Diagnosis Date   ADHD    Anxiety    Autism    Bipolar disorder ([place])    Chronic back pain    "mostly lower; sometimes all over" (03/22/2017)   Chronic stomach ulcer    Depression    GERD (gastroesophageal reflux disease)    Headache    "a few/week" (03/22/2017)   HLD (hyperlipidemia)    HTN (hypertension)    Hypothyroidism    Migraine    "monthly recently" (03/22/2017)   Obesity    Personality disorder ([place])    PTSD (post-traumatic stress disorder)    Seizures ([place])    "very random; I lose memory of what happens; usually happens when I'm in bed; probably 2-3/year" (03/22/2017)   Family History  Problem Relation Age of Onset   Diabetes Mother    Hypertension  Maternal Grandmother    Hyperlipidemia Maternal Grandmother    Diabetes Maternal Grandmother    Past Surgical History:  Procedure Laterality Date   wisdom tooth removal     Social History   Occupational History   Occupation: self employed  Tobacco Use   Smoking status: Never   Smokeless tobacco: Never  Vaping Use   Vaping Use: Never used  Substance and Sexual Activity   Alcohol use: No    Alcohol/week: 0.0 standard drinks of alcohol   Drug use: No   Sexual activity: Not Currently   I spent 50 minutes in the care of the patient today including face-to-face time, preparation to see the patient, as well as review of external notes from PCP, neurology; review of previous lab work; counseling and educating the patient on both organic and pathologic causes of weight gain and joint pain; home therapy and exercise regimens; independent review of prior x-rays for the  above diagnoses.

## 2021-12-10 LAB — CBC WITH DIFFERENTIAL/PLATELET
Absolute Monocytes: 819 cells/uL (ref 200–950)
Basophils Absolute: 65 cells/uL (ref 0–200)
Basophils Relative: 0.5 %
Eosinophils Absolute: 260 cells/uL (ref 15–500)
Eosinophils Relative: 2 %
HCT: 38.4 % (ref 35.0–45.0)
Hemoglobin: 12.4 g/dL (ref 11.7–15.5)
Lymphs Abs: 2951 cells/uL (ref 850–3900)
MCH: 25.9 pg — ABNORMAL LOW (ref 27.0–33.0)
MCHC: 32.3 g/dL (ref 32.0–36.0)
MCV: 80.3 fL (ref 80.0–100.0)
MPV: 9.7 fL (ref 7.5–12.5)
Monocytes Relative: 6.3 %
Neutro Abs: 8905 cells/uL — ABNORMAL HIGH (ref 1500–7800)
Neutrophils Relative %: 68.5 %
Platelets: 435 10*3/uL — ABNORMAL HIGH (ref 140–400)
RBC: 4.78 10*6/uL (ref 3.80–5.10)
RDW: 16.2 % — ABNORMAL HIGH (ref 11.0–15.0)
Total Lymphocyte: 22.7 %
WBC: 13 10*3/uL — ABNORMAL HIGH (ref 3.8–10.8)

## 2021-12-10 LAB — SEDIMENTATION RATE: Sed Rate: 46 mm/h — ABNORMAL HIGH (ref 0–20)

## 2021-12-10 LAB — ANA: Anti Nuclear Antibody (ANA): NEGATIVE

## 2021-12-10 LAB — RHEUMATOID FACTOR: Rheumatoid fact SerPl-aCnc: <14 IU/mL (ref <14)

## 2021-12-10 LAB — TSH: TSH: 5.05 mIU/L — ABNORMAL HIGH

## 2021-12-25 ENCOUNTER — Ambulatory Visit: Payer: Medicaid Other | Admitting: Sports Medicine

## 2022-01-13 ENCOUNTER — Encounter (INDEPENDENT_AMBULATORY_CARE_PROVIDER_SITE_OTHER): Payer: Medicaid Other | Admitting: Internal Medicine

## 2022-03-24 ENCOUNTER — Encounter: Payer: Self-pay | Admitting: Advanced Practice Midwife

## 2022-03-24 ENCOUNTER — Ambulatory Visit: Payer: Medicaid Other | Admitting: Advanced Practice Midwife

## 2022-03-24 VITALS — BP 144/96 | HR 93 | Ht 64.0 in | Wt 353.6 lb

## 2022-03-24 DIAGNOSIS — Z124 Encounter for screening for malignant neoplasm of cervix: Secondary | ICD-10-CM

## 2022-03-24 DIAGNOSIS — Z0000 Encounter for general adult medical examination without abnormal findings: Secondary | ICD-10-CM

## 2022-03-24 DIAGNOSIS — Z3009 Encounter for other general counseling and advice on contraception: Secondary | ICD-10-CM

## 2022-03-24 MED ORDER — CYCLOBENZAPRINE HCL 10 MG PO TABS: 10.0000 mg | ORAL_TABLET | Freq: Once | ORAL | 0 refills | Status: AC

## 2022-03-24 MED ORDER — MISOPROSTOL 200 MCG PO TABS: ORAL_TABLET | ORAL | 0 refills | Status: DC

## 2022-03-24 NOTE — Progress Notes (Signed)
NGYN reports heavy bleeding x 6 months. Nexplanon expires 12/2022  Pt does not want to have kids. She has questions regarding sterilization.  Unsure last pap per pt

## 2022-03-24 NOTE — Progress Notes (Signed)
Subjective:     Tamara [name] is a 27 y.o. adult here at [facility] for a routine exam.  Current complaints: heavy irregular bleeding with Nexplanon. Had amenorrhea, oligomenorrhea for a while but then periods returned and were heavy. Nexplanon is due  to be replaced 12/2022.  The pt is nonbinary and does not desire to have children at any time.  Personal health questionnaire reviewed: yes.  Do you have a primary care provider? yes Do you feel safe at home? yes  [place] Office Visit from 03/24/2022 in [place]  PHQ-2 Total Score 6       Health Maintenance Due  Topic Date Due   COVID-19 Vaccine (1) Never done   HPV VACCINES (1 - 2-dose series) Never done   Hepatitis C Screening  Never done   DTaP/Tdap/Td (1 - Tdap) Never done   PAP-Cervical Cytology Screening  Never done   PAP SMEAR-Modifier  Never done   INFLUENZA VACCINE  Never done     Risk factors for chronic health problems: Smoking: Alchohol/how much: Pt BMI: Body mass index is 60.7 kg/m.   Gynecologic History No LMP recorded (lmp unknown). Patient has had an implant. Contraception: Nexplanon Last Pap: unknown.  Last mammogram: n/a.  Obstetric History OB History  Gravida Para Term Preterm AB Living  0 0 0 0 0 0  SAB IAB Ectopic Multiple Live Births  0 0 0 0 0     The following portions of the patient's history were reviewed and updated as appropriate: allergies, current medications, past family history, past medical history, past social history, past surgical history, and problem list.  Review of Systems Pertinent items noted in HPI and remainder of comprehensive ROS otherwise negative.    Objective:  BP (!) 144/96   Pulse 93   Ht 5\' 4"  (1.626 m)   Wt (!) 353 lb 9.6 oz (160.4 kg)   LMP  (LMP Unknown) Comment: bleeding x 6 months  BMI 60.70 kg/m    VS reviewed, nursing note reviewed,  Constitutional: well developed, well nourished, no distress HEENT:  normocephalic, thyroid without enlargement or mass HEART: RRR, no murmurs rubs/gallops RESP: clear and equal to auscultation bilaterally in all lobes  Breast Exam: With shared decision making, exam performed: right breast normal without mass, skin or nipple changes or axillary nodes, left breast normal without mass, skin or nipple changes or axillary nodes Abdomen: soft Neuro: alert and oriented x 3 Skin: warm, dry Psych: affect normal Pelvic exam: Deferred     Assessment/Plan:   1. Screening for cervical cancer --Discussed Pap, will complete at return visit for IUD, see below.  2. Annual physical exam   3. Unwanted fertility --Considering BTL, but desires to try IUD, for benefit of amenorrhea/oligomenorrhea.   --IUD insertion was attempted in the past by another provider without success.  4. Encounter for counseling regarding contraception --Discussed pt contraceptive plans and reviewed contraceptive methods based on pt preferences and effectiveness.  Pt prefers Mirena IUD to reduce/eliminate periods and prevent pregnancy. --Premedications sent to pharmacy for procedure appt.  - cyclobenzaprine (FLEXERIL) 10 MG tablet; Take 1 tablet (10 mg total) by mouth once for 1 dose. Take 10 mg all at one time approximately 2 hours before your procedure.  Dispense: 1 tablet; Refill: 0 - misoprostol (CYTOTEC) 200 MCG tablet; Place 2 tablets in your cheek to soften then, then swallow approximately 2 hours before your procedure.  Dispense: 2 tablet; Refill: 0  No follow-ups on file.   Fatima Blank, CNM 2:45 PM

## 2022-04-27 ENCOUNTER — Ambulatory Visit: Payer: Medicaid Other | Admitting: Adult Health

## 2022-04-29 NOTE — Progress Notes (Deleted)
[facility] [address] [place]. [place] [address] [phone]       OFFICE FOLLOW UP NOTE    Geralyn Corwin Date of Birth:  [birth year] Medical Record Number:  [id number]   Reason for visit: CPAP follow-up    SUBJECTIVE:   CHIEF COMPLAINT:  No chief complaint on file.   HPI:   Tamara [name] is a 27 y.o. female who is being followed for OSA on CPAP.  Initially seen by Dr. Brett Fairy 07/2021 for concern of underlying sleep apnea. Completed HST 5/10 which showed total AHI of 19.8/h with a strong REM sleep dependence and positional variability.  Recommend initiating AutoPap which was started on 6/14.   At prior visit on 10/23/2021, noted poor CPAP compliance although optimal residual AHI due to difficulty tolerating.  She has significant underlying anxiety on polypharmacy, followed closely by mental health. Repeat download a couple weeks later showed improved compliance continued optimal residual AHI.   Interval history:           ROS:   14 system review of systems performed and negative with exception of those listed in HPI  PMH:  Past Medical History:  Diagnosis Date   ADHD    Anxiety    Autism    Bipolar disorder ([place])    Chronic back pain    "mostly lower; sometimes all over" (03/22/2017)   Chronic stomach ulcer    Depression    GERD (gastroesophageal reflux disease)    Headache    "a few/week" (03/22/2017)   HLD (hyperlipidemia)    HTN (hypertension)    Hypothyroidism    Migraine    "monthly recently" (03/22/2017)   Obesity    Personality disorder ([place])    PTSD (post-traumatic stress disorder)    Seizures ([place])    "very random; I lose memory of what happens; usually happens when I'm in bed; probably 2-3/year" (03/22/2017)    PSH:  Past Surgical History:  Procedure Laterality Date   wisdom tooth removal      Social History:  Social History   Socioeconomic History   Marital status: Single    Spouse name: Not  on file   Number of children: 0   Years of education: junior year of college   Highest education level: Some college, no degree  Occupational History   Occupation: self employed  Tobacco Use   Smoking status: Never    Passive exposure: Never   Smokeless tobacco: Never  Vaping Use   Vaping Use: Never used  Substance and Sexual Activity   Alcohol use: No    Alcohol/week: 0.0 standard drinks of alcohol   Drug use: No   Sexual activity: Not Currently    Partners: Male, Female    Birth control/protection: Implant, Condom  Other Topics Concern   Not on file  Social History Narrative   Lives with roommate   R handed   Caffeine: 376m a day   Social Determinants of Health   Financial Resource Strain: Not on file  Food Insecurity: Not on file  Transportation Needs: Not on file  Physical Activity: Not on file  Stress: Not on file  Social Connections: Not on file  Intimate Partner Violence: Not on file    Family History:  Family History  Problem Relation Age of Onset   Diabetes Mother    Hypertension Maternal Grandmother    Hyperlipidemia Maternal Grandmother    Diabetes Maternal Grandmother     Medications:   Current Outpatient Medications on  File Prior to Visit  Medication Sig Dispense Refill   acetaminophen (TYLENOL) 500 MG tablet Take 2 tablets (1,000 mg total) by mouth every 8 (eight) hours as needed for moderate pain. 30 tablet 0   buPROPion (WELLBUTRIN SR) 200 MG 12 hr tablet Take 1 tablet by mouth daily.     cyclobenzaprine (FLEXERIL) 5 MG tablet Take 1 tablet by mouth as needed.     erythromycin ophthalmic ointment Place 1 application into the left eye 3 (three) times daily. 14 g 0   etonogestrel (NEXPLANON) 68 MG IMPL implant Inject into the skin.     FLUoxetine (PROZAC) 40 MG capsule Take 40 mg by mouth daily.     hydrOXYzine (ATARAX/VISTARIL) 25 MG tablet Take 1 tablet (25 mg total) by mouth at bedtime. For anxiety 30 tablet 0   levothyroxine (SYNTHROID) 25  MCG tablet Take 25 mcg by mouth daily.     lidocaine (XYLOCAINE) 2 % solution SMARTSIG:By Mouth (Patient not taking: Reported on 10/23/2021)     Melatonin 5 MG CAPS Take 1 tablet by mouth at bedtime.     misoprostol (CYTOTEC) 200 MCG tablet Place 2 tablets in your cheek to soften then, then swallow approximately 2 hours before your procedure. 2 tablet 0   naproxen (NAPROSYN) 500 MG tablet Take 1 tablet (500 mg total) by mouth 2 (two) times daily as needed. 30 tablet 0   omeprazole (PRILOSEC) 20 MG capsule Take 20 mg by mouth daily.     paliperidone (INVEGA) 3 MG 24 hr tablet Take 3 mg by mouth at bedtime.     traMADol (ULTRAM) 50 MG tablet Take 1 tablet by mouth as needed.     No current facility-administered medications on file prior to visit.    Allergies:   Allergies  Allergen Reactions   Asa [Aspirin] Anaphylaxis   Pork Allergy Anaphylaxis and Nausea And Vomiting   Pork-Derived Products Shortness Of Breath   Lithium Hives    Other reaction(s): Hives, dizziness   Shrimp Extract Allergy Skin Test Itching      OBJECTIVE:  Physical Exam  There were no vitals filed for this visit.  There is no height or weight on file to calculate BMI. No results found.  General: Morbidly obese extremely anxious young female Head: head normocephalic and atraumatic.   Neck: supple with no carotid or supraclavicular bruits Cardiovascular: regular rate and rhythm, no murmurs Musculoskeletal: no deformity Skin:  no rash/petichiae Vascular:  Normal pulses all extremities   Neurologic Exam Mental Status: Awake and fully alert. Oriented to place and time. Recent memory subjectively impaired and remote memory intact. Mood and affect extremely anxious.  Cranial Nerves: Pupils equal, briskly reactive to light. Extraocular movements full without nystagmus. Visual fields full to confrontation. Hearing intact. Facial sensation intact. Face, tongue, palate moves normally and symmetrically.  Motor: Normal  bulk and tone. Normal strength in all tested extremity muscles Sensory.: intact to touch , pinprick , position and vibratory sensation.  Coordination: Rapid alternating movements normal in all extremities. Finger-to-nose and heel-to-shin performed accurately bilaterally. Gait and Station: Arises from chair without difficulty. Stance is normal. Gait demonstrates normal stride length and balance without use of AD.  Reflexes: 1+ and symmetric. Toes downgoing.         ASSESSMENT/PLAN: Keala Dasari is a 27 y.o. year old adult with recent diagnosis of moderate sleep apnea per HST in 07/2021 and initiation of AutoPap 08/2021    OSA on CPAP : Compliance report shows satisfactory usage  with optimal residual AHI.  Continue current pressure settings.  Discussed continued nightly usage with ensuring greater than 4 hours nightly for optimal benefit and per insurance purposes.  Continue to follow with DME company for any needed supplies or CPAP related concerns  Morbid obesity, BMI 66.01: Discussed importance of weight loss and would likely benefit from being seen by weight loss clinic. Would recommend that she first try to focus on getting her anxiety under better control which will make it easier for her to focus on weight loss goals.  Anxiety: Patient with significant anxiety.  Highly encouraged contacting her psychiatrist and/or psychologist to further discuss these concerns.  She is on multiple medications which could be greatly contributing to her excessive fatigue.      Follow up in 6 months or call earlier if needed    CC:  PCP: Dianna Rossetti, NP    I spent 26 minutes of face-to-face and non-face-to-face time with patient.  This included previsit chart review, lab review, study review, order entry, electronic health record documentation, patient education regarding diagnosis of sleep apnea with review and discussion of compliance report and answered all other questions to patient's  satisfaction   Frann Rider, [facility]  [facility] [address] [place], [place] [address]  Phone [phone] Fax [phone] Note: This document was prepared with digital dictation and possible smart phrase technology. Any transcriptional errors that result from this process are unintentional.

## 2022-04-30 ENCOUNTER — Ambulatory Visit: Payer: Medicaid Other | Admitting: Adult Health

## 2022-04-30 ENCOUNTER — Encounter: Payer: Self-pay | Admitting: Adult Health

## 2022-05-11 ENCOUNTER — Encounter: Payer: Self-pay | Admitting: Advanced Practice Midwife

## 2022-05-11 ENCOUNTER — Ambulatory Visit (INDEPENDENT_AMBULATORY_CARE_PROVIDER_SITE_OTHER): Payer: Medicaid Other | Admitting: Advanced Practice Midwife

## 2022-05-11 ENCOUNTER — Other Ambulatory Visit (HOSPITAL_COMMUNITY)
Admission: RE | Admit: 2022-05-11 | Discharge: 2022-05-11 | Disposition: A | Discharge status: Home / Self Care | Payer: Medicaid Other | Source: Ambulatory Visit | Attending: Advanced Practice Midwife | Admitting: Advanced Practice Midwife

## 2022-05-11 VITALS — BP 137/96 | HR 102 | Wt 354.0 lb

## 2022-05-11 DIAGNOSIS — Z01419 Encounter for gynecological examination (general) (routine) without abnormal findings: Secondary | ICD-10-CM

## 2022-05-11 DIAGNOSIS — F640 Transsexualism: Secondary | ICD-10-CM | POA: Insufficient documentation

## 2022-05-11 DIAGNOSIS — Z0000 Encounter for general adult medical examination without abnormal findings: Secondary | ICD-10-CM | POA: Diagnosis present

## 2022-05-11 DIAGNOSIS — Z3046 Encounter for surveillance of implantable subdermal contraceptive: Secondary | ICD-10-CM

## 2022-05-11 DIAGNOSIS — Z975 Presence of (intrauterine) contraceptive device: Secondary | ICD-10-CM

## 2022-05-11 MED ORDER — ETONOGESTREL 68 MG ~~LOC~~ IMPL
68.0000 mg | DRUG_IMPLANT | Freq: Once | SUBCUTANEOUS | Status: AC
  Administered 2022-05-11: 68 mg via SUBCUTANEOUS

## 2022-05-11 MED ORDER — ETONOGESTREL 68 MG ~~LOC~~ IMPL: 68.0000 mg | DRUG_IMPLANT | Freq: Once | SUBCUTANEOUS | Status: DC

## 2022-05-11 NOTE — Progress Notes (Signed)
Pt presents for annual exam and IUD insert. Pt currently has Nexplanon for birth control. Pt needs PAP. No concerns at this time.

## 2022-05-11 NOTE — Progress Notes (Signed)
Subjective:     Tamara [name] is a 27 y.o. adult here at [facility] for a routine exam, Nexplanon removal, and IUD insertion.  Current complaints: irregular menses started a few months ago, has been amenorrheic with Nexplanon x 3 years.  Personal health questionnaire reviewed: yes.  Do you have a primary care provider? yes Do you feel safe at home? yes  [place] Visit from 03/24/2022 in [facility] for [place] at [facility] Total Score 6       Health Maintenance Due  Topic Date Due   COVID-19 Vaccine (1) Never done   HPV VACCINES (1 - 2-dose series) Never done   Hepatitis C Screening  Never done   DTaP/Tdap/Td (1 - Tdap) Never done   PAP-Cervical Cytology Screening  Never done   PAP SMEAR-Modifier  Never done   INFLUENZA VACCINE  Never done     Risk factors for chronic health problems: Smoking: Alchohol/how much: Pt BMI: Body mass index is 60.76 kg/m.   Gynecologic History No LMP recorded. Patient has had an implant. Contraception: Nexplanon Last Pap: unknown. Last mammogram: n/a.   Obstetric History OB History  Gravida Para Term Preterm AB Living  0 0 0 0 0 0  SAB IAB Ectopic Multiple Live Births  0 0 0 0 0     The following portions of the patient's history were reviewed and updated as appropriate: allergies, current medications, past family history, past medical history, past social history, past surgical history, and problem list.  Review of Systems Pertinent items noted in HPI and remainder of comprehensive ROS otherwise negative.    Objective:   VS reviewed, nursing note reviewed,  Constitutional: well developed, well nourished, no distress HEENT: normocephalic, thyroid without enlargement or mass HEART: RRR, no murmurs rubs/gallops RESP: clear and equal to auscultation bilaterally in all lobes  Breast Exam:Deferred. Done at prior visit. Neuro: alert and oriented x 3 Skin: warm, dry Psych: affect  normal Pelvic exam:Performed: Cervix pink, visually closed, without lesion, scant white creamy discharge, vaginal walls and external genitalia normal Bimanual exam: Cervix 0/long/high, firm, anterior, neg CMT, uterus nontender, nonenlarged, adnexa without tenderness, enlargement, or mass      Nexplanon Removal and Insertion  Patient identified, informed consent performed, consent signed.   Patient does understand that irregular bleeding is a very common side effect of this medication. Pt was advised to have backup contraception for one week after replacement of the implant. Appropriate time out taken. Implanon site identified. Area prepped in usual sterile fashon. One ml of 1% lidocaine was used to anesthetize the area at the distal end of the implant. A small stab incision was made right beside the implant on the distal portion. The Nexplanon rod was grasped using hemostats and removed without difficulty. There was minimal blood loss. There were no complications. Area was then injected with 3 ml of 1 % lidocaine. Pt was re-prepped with betadine, Nexplanon removed from packaging, Device confirmed in needle, then inserted full length of needle and withdrawn per handbook instructions. Nexplanon was able to palpated in the patient's arm; patient also palpated the device.  There was minimal blood loss. Patient insertion site covered with gauze and a pressure bandage to reduce any bruising. The patient tolerated the procedure well and was given post procedure instructions.   Return in about 1 year (around 05/11/2023) for annual exam.   Fatima Blank, CNM 2:56 PM       Assessment/Plan:  1. Annual physical exam  - Cytology - PAP  2. Encounter for removal and reinsertion of Nexplanon  --Pt has very posterior cervix and tolerated SSE for Pap but did not proceed with IUD insertion due to pt discomfort. --Discussed and plan to continue Nexplanon with removal and reinsertion today for better chance  to achieve oligo or amenorrhea.  --Nexplanon removed and reinserted without difficulty. See procedure note above.       Return in about 1 year (around 05/11/2023) for annual exam.   Fatima Blank, CNM 2:56 PM

## 2022-05-13 LAB — CYTOLOGY - PAP
Comment: NEGATIVE
Diagnosis: NEGATIVE
High risk HPV: NEGATIVE

## 2022-06-10 ENCOUNTER — Ambulatory Visit: Payer: Medicaid Other | Attending: Internal Medicine

## 2022-06-10 ENCOUNTER — Other Ambulatory Visit: Payer: Self-pay

## 2022-06-10 DIAGNOSIS — M25561 Pain in right knee: Secondary | ICD-10-CM | POA: Diagnosis present
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
## 2022-06-17 ENCOUNTER — Ambulatory Visit: Payer: Medicaid Other

## 2022-06-17 DIAGNOSIS — M25561 Pain in right knee: Secondary | ICD-10-CM | POA: Diagnosis not present
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
## 2022-06-24 ENCOUNTER — Ambulatory Visit: Payer: Medicaid Other | Admitting: Physical Therapy

## 2022-06-24 ENCOUNTER — Encounter: Payer: Self-pay | Admitting: Physical Therapy

## 2022-06-24 DIAGNOSIS — M25561 Pain in right knee: Secondary | ICD-10-CM

## 2022-07-01 ENCOUNTER — Ambulatory Visit: Payer: Medicaid Other

## 2022-07-01 DIAGNOSIS — M25561 Pain in right knee: Secondary | ICD-10-CM | POA: Diagnosis not present
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
## 2022-07-08 ENCOUNTER — Telehealth: Payer: Self-pay | Admitting: Physical Therapy

## 2022-07-08 ENCOUNTER — Ambulatory Visit: Payer: Medicaid Other | Attending: Internal Medicine | Admitting: Physical Therapy

## 2022-07-08 NOTE — Telephone Encounter (Signed)
LVM  for Ms. Sybert about missed appt today at [facility]. Pt did not show for appt today and had reminder call sent and verified by office staff.   PT  reminded pt for future appt and also reminded pt of cancellation/no show policy.    Garen Lah, PT, ATRIC Certified Exercise Expert for the Aging Adult  07/08/22 3:34 PM Phone: [phone] Fax: [phone]

## 2022-07-15 ENCOUNTER — Ambulatory Visit: Payer: Medicaid Other

## 2022-07-16 ENCOUNTER — Telehealth: Payer: Self-pay

## 2022-07-16 NOTE — Telephone Encounter (Signed)
I left a message for the patient on 07/15/22 at 6:30pm regarding missed appointment; reminded of next scheduled appt and cancellation policy. - MJ

## 2022-07-24 ENCOUNTER — Ambulatory Visit: Payer: Medicaid Other

## 2022-07-31 ENCOUNTER — Other Ambulatory Visit: Payer: Self-pay

## 2022-07-31 ENCOUNTER — Emergency Department (HOSPITAL_COMMUNITY)
Admission: EM | Admit: 2022-07-31 | Discharge: 2022-07-31 | Disposition: A | Discharge status: Home / Self Care | Payer: Medicaid Other | Attending: Emergency Medicine | Admitting: Emergency Medicine

## 2022-07-31 DIAGNOSIS — M545 Low back pain, unspecified: Secondary | ICD-10-CM | POA: Diagnosis present

## 2022-07-31 DIAGNOSIS — G8929 Other chronic pain: Secondary | ICD-10-CM | POA: Insufficient documentation

## 2022-07-31 MED ORDER — HYDROCODONE-ACETAMINOPHEN 5-325 MG PO TABS: 1.0000 | ORAL_TABLET | Freq: Four times a day (QID) | ORAL | 0 refills | Status: DC | PRN

## 2022-07-31 MED ORDER — DEXAMETHASONE SODIUM PHOSPHATE 10 MG/ML IJ SOLN
10.0000 mg | Freq: Once | INTRAMUSCULAR | Status: AC
  Administered 2022-07-31: 10 mg via INTRAMUSCULAR
  Filled 2022-07-31: qty 1

## 2022-07-31 MED ORDER — HYDROMORPHONE HCL 1 MG/ML IJ SOLN
1.0000 mg | Freq: Once | INTRAMUSCULAR | Status: AC
  Administered 2022-07-31: 1 mg via INTRAMUSCULAR
  Filled 2022-07-31: qty 1

## 2022-07-31 MED ORDER — METHYLPREDNISOLONE 4 MG PO TBPK: ORAL_TABLET | ORAL | 0 refills | Status: DC

## 2022-07-31 NOTE — ED Provider Notes (Signed)
[place] EMERGENCY DEPARTMENT AT [facility] Provider Note   CSN: [id number] Arrival date & time: 07/31/22  2031     History  Chief Complaint  Patient presents with   Back Pain    Tamara [name] is a 27 y.o. adult.  HPI Patient reports she has severe back pain.  She reports is the entirety of her back.  It concentrates more into the lower back.  Patient's pain is aching and sharp.  She reports she can remember when she did not have pain.  Is been present for at least a year.  She reports that this continues to worsen despite all types of treatments that she has had.  She reports she tried all of her recommended home treatments including heating pads and patches.  She has been doing physical therapy without improvement.  She was seen by [facility] in October last year with all over body pain.  At that time orders were done for RA and ANA testing.  These tested negative.  She does not have any formal diagnosis of lupus or rheumatoid arthritis.    Home Medications Prior to Admission medications   Medication Sig Start Date End Date Taking? Authorizing Provider  HYDROcodone-acetaminophen (NORCO/VICODIN) 5-325 MG tablet Take 1 tablet by mouth every 6 (six) hours as needed. 07/31/22  Yes Arby Barrette, MD  methylPREDNISolone (MEDROL DOSEPAK) 4 MG TBPK tablet Per dose pack instruction 07/31/22  Yes Jaheim Canino, Lebron Conners, MD  acetaminophen (TYLENOL) 500 MG tablet Take 2 tablets (1,000 mg total) by mouth every 8 (eight) hours as needed for moderate pain. 09/19/18   Law, Waylan Boga, PA-C  buPROPion (WELLBUTRIN SR) 200 MG 12 hr tablet Take 1 tablet by mouth daily.    [provider]  cyclobenzaprine (FLEXERIL) 5 MG tablet Take 1 tablet by mouth as needed. Patient not taking: Reported on 06/10/2022    [provider]  erythromycin ophthalmic ointment Place 1 application into the left eye 3 (three) times daily. Patient not taking: Reported on 06/10/2022  02/06/21   Margaretann Loveless, PA-C  etonogestrel (NEXPLANON) 68 MG IMPL implant Inject into the skin.    [provider]  FLUoxetine (PROZAC) 40 MG capsule Take 40 mg by mouth daily. 06/05/21   [provider]  hydrOXYzine (ATARAX/VISTARIL) 25 MG tablet Take 1 tablet (25 mg total) by mouth at bedtime. For anxiety Patient taking differently: Take 100 mg by mouth at bedtime. For anxiety 07/14/18   Aldean Baker, NP  levothyroxine (SYNTHROID) 25 MCG tablet Take 25 mcg by mouth daily. 07/07/21   [provider]  lidocaine (XYLOCAINE) 2 % solution SMARTSIG:By Mouth Patient not taking: Reported on 10/23/2021 04/14/21   [provider]  Melatonin 10 MG CAPS Take 1 tablet by mouth at bedtime. Patient not taking: Reported on 06/10/2022    [provider]  misoprostol (CYTOTEC) 200 MCG tablet Place 2 tablets in your cheek to soften then, then swallow approximately 2 hours before your procedure. Patient not taking: Reported on 06/10/2022 03/24/22   Sharen Counter A, CNM  naproxen (NAPROSYN) 500 MG tablet Take 1 tablet (500 mg total) by mouth 2 (two) times daily as needed. 12/10/20   Milagros Loll, MD  omeprazole (PRILOSEC) 20 MG capsule Take 20 mg by mouth daily.    [provider]  paliperidone (INVEGA) 3 MG 24 hr tablet Take 3 mg by mouth at bedtime. 07/08/21   [provider]  traMADol (ULTRAM) 50 MG tablet Take  1 tablet by mouth as needed. 12/11/20   [provider]      Allergies    Asa [aspirin], Pork allergy, Pork-derived products, Lithium, and Shrimp extract    Review of Systems   Review of Systems  Physical Exam Updated Vital Signs BP (!) 150/83 (BP Location: Right Arm)   Pulse 62   Temp 97.8 F (36.6 C) (Oral)   Resp 16   Ht 5\' 1"  (1.549 m)   Wt (!) 163.3 kg   SpO2 100%   BMI 68.02 kg/m  Physical Exam Constitutional:      Comments: Patient is alert.  Clear mental status.  No respiratory distress.  Clinically  well in appearance.    HENT:     Mouth/Throat:     Pharynx: Oropharynx is clear.  Eyes:     Extraocular Movements: Extraocular movements intact.  Cardiovascular:     Rate and Rhythm: Normal rate and regular rhythm.  Pulmonary:     Effort: Pulmonary effort is normal.     Breath sounds: Normal breath sounds.  Abdominal:     General: There is no distension.     Palpations: Abdomen is soft.     Tenderness: There is no abdominal tenderness. There is no guarding.  Musculoskeletal:     Cervical back: Neck supple.     Comments: Visual inspection of the back.  Patient endorses pain to palpation from the top of the thoracic spine all the way down to the lumbar spine.  There is no localizing.  The soft tissues are normal without rash, erythema or wounds.  No peripheral edema.  The calves are soft and nontender.  Patient exhibited intact strength by ability to roll and reposition herself using both her arms and legs to maneuver to her side in the stretcher to both the left and right side at request.  She was also able to take both lower extremities, Flex at the knees and plant her feet and use full strength to push her body up in the stretcher to reposition.  And asked to perform plantarflexion patient kept her feet stiff and did not really extend citing difficulty however she did exhibit complete strength in plantarflexion by placing feet flat on the stretcher and then using this extended position of the foot to move her body weight forcefully.  Skin:    General: Skin is warm and dry.  Neurological:     General: No focal deficit present.     Mental Status: Tamara [name] is oriented to person, place, and time.     Motor: No weakness.     Coordination: Coordination normal.     ED Results / Procedures / Treatments   Labs (all labs ordered are listed, but only abnormal results are displayed) Labs Reviewed - No data to display  EKG None  Radiology No results  found.  Procedures Procedures    Medications Ordered in ED Medications  dexamethasone (DECADRON) injection 10 mg (10 mg Intramuscular Given 07/31/22 2155)  HYDROmorphone (DILAUDID) injection 1 mg (1 mg Intramuscular Given 07/31/22 2155)    ED Course/ Medical Decision Making/ A&P                             Medical Decision Making Risk Prescription drug management.   Patient presents with back pain that does not localize well.  She initially describes pain as being in the entirety of her back but then subsequently localizes it down  to the left lower back.  On physical exam there is no evidence of any motor deficit.  Skin is warm and dry and there are no wounds or soft tissue changes.  EMR reviewed.  Patient has had longstanding pain.  She has had diagnostic evaluation to evaluate for lupus and rheumatoid arthritis which have been negative.  She has been undergoing physical therapy without relief.  At this time however there is no evidence of neurologic compromise.  I do feel patient is stable for pain treatment for exacerbation of chronic pain and continue to follow-up.  She reports she has an appointment in about a week with her doctor.  Do not see any evidence of a prior MRI.  Patient does not recall having MRI.  At this time I recommended to her to discuss outpatient MRI for worsening but chronic back pain.  This could be useful in ruling out any significant herniation or stenosis.  Currently however with no neurologic dysfunction and findings consistent with acute on chronic pain, I do not feel that emergent imaging is indicated.  Plan to treat for pain with Decadron and Dilaudid.  Recheck: Patient is improved.  She is sitting up comfortably in the bed on the phone.  Reports she still has some pain but much better.  At this time stable for discharge.  Will prescribe Decadron and short course of hydrocodone.  She will require follow-up with her physician to discuss possible outpatient  MRI.        Final Clinical Impression(s) / ED Diagnoses Final diagnoses:  Acute exacerbation of chronic low back pain    Rx / DC Orders ED Discharge Orders          Ordered    methylPREDNISolone (MEDROL DOSEPAK) 4 MG TBPK tablet        07/31/22 2335    HYDROcodone-acetaminophen (NORCO/VICODIN) 5-325 MG tablet  Every 6 hours PRN        07/31/22 2335              Arby Barrette, MD 07/31/22 2336

## 2022-07-31 NOTE — ED Triage Notes (Signed)
Pt BIB [facility] from home.pt c/o chronic back pain that is getting worse over the week.  118 palpated pressure.  64Hr 16RR 99o2%

## 2022-07-31 NOTE — Discharge Instructions (Signed)
1.  Follow-up for your doctor for recheck for your back pain. 2.  Discuss getting an MRI with your doctor since you are being continuing pain is worsening.

## 2022-08-02 ENCOUNTER — Encounter (HOSPITAL_COMMUNITY): Payer: Self-pay

## 2022-08-02 ENCOUNTER — Emergency Department (HOSPITAL_COMMUNITY)
Admission: EM | Admit: 2022-08-02 | Discharge: 2022-08-02 | Disposition: A | Discharge status: Home / Self Care | Payer: Medicaid Other | Attending: Emergency Medicine | Admitting: Emergency Medicine

## 2022-08-02 ENCOUNTER — Other Ambulatory Visit: Payer: Self-pay

## 2022-08-02 DIAGNOSIS — M549 Dorsalgia, unspecified: Secondary | ICD-10-CM

## 2022-08-02 DIAGNOSIS — I10 Essential (primary) hypertension: Secondary | ICD-10-CM | POA: Diagnosis not present

## 2022-08-02 MED ORDER — TRAMADOL HCL 50 MG PO TABS
50.0000 mg | ORAL_TABLET | Freq: Once | ORAL | Status: AC
  Administered 2022-08-02: 50 mg via ORAL
  Filled 2022-08-02: qty 1

## 2022-08-02 MED ORDER — TRAMADOL HCL 50 MG PO TABS: 50.0000 mg | ORAL_TABLET | Freq: Four times a day (QID) | ORAL | 0 refills | Status: AC | PRN

## 2022-08-02 NOTE — ED Provider Notes (Signed)
[place] EMERGENCY DEPARTMENT AT [facility] Provider Note   CSN: [id number] Arrival date & time: 08/02/22  2019     History  Chief Complaint  Patient presents with   Back Pain    Tamara [name] is a 26 y.o. adult.  Patient is a 27 year old female who presents with back pain.  She was here in the ED 2 days ago for same.  She has a history of bipolar disorder, chronic back pain, personality disorder, hypertension, hyperlipidemia.  She said that she has had a degree of back pain for years.  She has had some worsening back pain over the last couple of weeks.  She says it is primas all over her back.  It is more in the lower back but hurts all over her back.  It is worse with movement.  She has some radiation down her legs.  No numbness or weakness in her legs.  No loss of bowel or bladder control.  No fevers.  She was seen in the ED 2 days ago and was given a prescription for steroid pack and short course of hydrocodone.  She says she does not really feel like it is helping it.  She does state that she has taken tramadol in the past and thinks that helps more and is requesting a prescription for tramadol.  She has an appointment to follow-up with her PCP in 2 days.       Home Medications Prior to Admission medications   Medication Sig Start Date End Date Taking? Authorizing Provider  acetaminophen (TYLENOL) 500 MG tablet Take 2 tablets (1,000 mg total) by mouth every 8 (eight) hours as needed for moderate pain. 09/19/18  Yes Law, Waylan Boga, PA-C  buPROPion ER (WELLBUTRIN SR) 100 MG 12 hr tablet Take 100 mg by mouth daily.   Yes [provider]  etonogestrel (NEXPLANON) 68 MG IMPL implant Inject into the skin.   Yes [provider]  FLUoxetine (PROZAC) 40 MG capsule Take 40 mg by mouth daily. 06/05/21  Yes [provider]  HYDROcodone-acetaminophen (NORCO/VICODIN) 5-325 MG tablet Take 1 tablet by mouth every 6 (six) hours as needed. Patient  taking differently: Take 1 tablet by mouth every 6 (six) hours as needed for moderate pain. 07/31/22  Yes Arby Barrette, MD  hydrOXYzine (VISTARIL) 50 MG capsule Take 100 mg by mouth daily. 07/08/22  Yes [provider]  levothyroxine (SYNTHROID) 25 MCG tablet Take 25 mcg by mouth every evening. 07/07/21  Yes [provider]  methylPREDNISolone (MEDROL DOSEPAK) 4 MG TBPK tablet Per dose pack instruction Patient taking differently: Take 4 mg by mouth See admin instructions. Per dose pack instruction take as directed per patient 07/31/22  Yes Pfeiffer, Lebron Conners, MD  omeprazole (PRILOSEC) 20 MG capsule Take 20 mg by mouth daily.   Yes [provider]  paliperidone (INVEGA) 3 MG 24 hr tablet Take 3 mg by mouth at bedtime. 07/08/21  Yes [provider]  traMADol (ULTRAM) 50 MG tablet Take 1 tablet (50 mg total) by mouth every 6 (six) hours as needed. 08/02/22  Yes Rolan Bucco, MD  Vitamin D, Ergocalciferol, (DRISDOL) 1.25 MG (50000 UNIT) CAPS capsule Take 50,000 Units by mouth every 7 (seven) days.   Yes [provider]  erythromycin ophthalmic ointment Place 1 application into the left eye 3 (three) times daily. Patient not taking: Reported on 06/10/2022 02/06/21   Margaretann Loveless, PA-C  hydrOXYzine (ATARAX/VISTARIL) 25 MG tablet Take 1 tablet (25 mg  total) by mouth at bedtime. For anxiety Patient not taking: Reported on 08/02/2022 07/14/18   Aldean Baker, NP  lidocaine (XYLOCAINE) 2 % solution SMARTSIG:By Mouth Patient not taking: Reported on 10/23/2021 04/14/21   [provider]  misoprostol (CYTOTEC) 200 MCG tablet Place 2 tablets in your cheek to soften then, then swallow approximately 2 hours before your procedure. Patient not taking: Reported on 06/10/2022 03/24/22   Sharen Counter A, CNM  naproxen (NAPROSYN) 500 MG tablet Take 1 tablet (500 mg total) by mouth 2 (two) times daily as needed. Patient not taking: Reported on 08/02/2022 12/10/20    Milagros Loll, MD      Allergies    Asa [aspirin], Pork allergy, Pork-derived products, Lithium, and Shrimp extract    Review of Systems   Review of Systems  Constitutional:  Negative for chills, diaphoresis, fatigue and fever.  HENT:  Negative for congestion, rhinorrhea and sneezing.   Eyes: Negative.   Respiratory:  Negative for cough, chest tightness and shortness of breath.   Cardiovascular:  Negative for chest pain and leg swelling.  Gastrointestinal:  Negative for abdominal pain, blood in stool, diarrhea, nausea and vomiting.  Genitourinary:  Negative for difficulty urinating, flank pain, frequency and hematuria.  Musculoskeletal:  Positive for back pain. Negative for arthralgias.  Skin:  Negative for rash.  Neurological:  Negative for dizziness, speech difficulty, weakness, numbness and headaches.    Physical Exam Updated Vital Signs BP 129/66 (BP Location: Right Arm)   Pulse 72   Temp 97.7 F (36.5 C) (Oral)   Resp 19   Ht 5\' 1"  (1.549 m)   Wt (!) 163.3 kg   SpO2 100%   BMI 68.02 kg/m  Physical Exam Constitutional:      Appearance: Tamara [name] is well-developed. Tamara [name] is obese.  HENT:     Head: Normocephalic and atraumatic.  Eyes:     Pupils: Pupils are equal, round, and reactive to light.  Cardiovascular:     Rate and Rhythm: Normal rate and regular rhythm.     Heart sounds: Normal heart sounds.  Pulmonary:     Effort: Pulmonary effort is normal. No respiratory distress.     Breath sounds: Normal breath sounds. No wheezing or rales.  Chest:     Chest wall: No tenderness.  Abdominal:     General: Bowel sounds are normal.     Palpations: Abdomen is soft.     Tenderness: There is no abdominal tenderness. There is no guarding or rebound.  Musculoskeletal:        General: Normal range of motion.     Cervical back: Normal range of motion and neck supple.     Comments: Tenderness throughout her back.  It is along her spine as  well as the musculature on both sides of her back.  Negative straight leg raise bilaterally.  She has normal sensation and motor function in the lower extremities bilaterally.  Unable to accurately elicit patellar reflexes due to her size.  Lymphadenopathy:     Cervical: No cervical adenopathy.  Skin:    General: Skin is warm and dry.     Findings: No rash.  Neurological:     Mental Status: Tamara [name] is alert and oriented to person, place, and time.     Comments: Motor 5 out of 5 all extremities, sensation grossly intact to light touch all extremities     ED Results / Procedures / Treatments   Labs (all labs  ordered are listed, but only abnormal results are displayed) Labs Reviewed - No data to display  EKG None  Radiology No results found.  Procedures Procedures    Medications Ordered in ED Medications  traMADol (ULTRAM) tablet 50 mg (has no administration in time range)    ED Course/ Medical Decision Making/ A&P                             Medical Decision Making Risk Prescription drug management.   Patient is a 27 year old female who presents with back pain.  Sound like she has a degree of chronic pain not only in her back but other areas as well.  She has not had any recent falls or trauma.  She does not have any neurologic deficits or signs of cauda equina.  No fever or other indications for emergent imaging.  She requests tramadol for her pain.  I do not think is unreasonable to give her a short course of tramadol.  She was discharged home with the condition.  She has an appointment to follow-up with her PCP on Tuesday.  Return precautions were given.  Final Clinical Impression(s) / ED Diagnoses Final diagnoses:  Acute bilateral back pain, unspecified back location    Rx / DC Orders ED Discharge Orders          Ordered    traMADol (ULTRAM) 50 MG tablet  Every 6 hours PRN        08/02/22 2140              Rolan Bucco, MD 08/02/22  2144

## 2022-08-02 NOTE — ED Triage Notes (Signed)
Patient arrived via EMS from home with c/o back pain in her entire spine.  She was seen here two days ago for same and she has been taking her medications as prescribed without relief.  States she has had several injuries to her back but this pain has increased gradually

## 2022-08-07 ENCOUNTER — Other Ambulatory Visit: Payer: Self-pay | Admitting: Nurse Practitioner

## 2022-08-07 ENCOUNTER — Ambulatory Visit
Admission: RE | Admit: 2022-08-07 | Discharge: 2022-08-07 | Disposition: A | Discharge status: Home / Self Care | Payer: Medicaid Other | Source: Ambulatory Visit | Attending: Nurse Practitioner | Admitting: Nurse Practitioner

## 2022-08-07 DIAGNOSIS — G8929 Other chronic pain: Secondary | ICD-10-CM

## 2022-08-11 ENCOUNTER — Ambulatory Visit: Payer: Medicaid Other | Admitting: Sports Medicine

## 2022-08-17 ENCOUNTER — Ambulatory Visit: Payer: Medicaid Other | Admitting: Sports Medicine

## 2022-08-17 DIAGNOSIS — M545 Low back pain, unspecified: Secondary | ICD-10-CM

## 2022-08-17 DIAGNOSIS — M2550 Pain in unspecified joint: Secondary | ICD-10-CM | POA: Diagnosis not present

## 2022-08-17 DIAGNOSIS — G8929 Other chronic pain: Secondary | ICD-10-CM

## 2022-08-17 DIAGNOSIS — Z6841 Body Mass Index (BMI) 40.0 and over, adult: Secondary | ICD-10-CM

## 2022-08-17 DIAGNOSIS — E039 Hypothyroidism, unspecified: Secondary | ICD-10-CM

## 2022-08-17 NOTE — Progress Notes (Unsigned)
Back pain; chronic Acute pain last few weeks Went to ED twice for this  Still having significant pain; tramadol helps

## 2022-08-17 NOTE — Progress Notes (Signed)
Tamara [name] - 27 y.o. adult MRN [id number]  Date of birth: Dec 20, 1995  Office Visit Note: Visit Date: 08/17/2022 PCP: [facility] Referred by: [facility]  Subjective: Chief Complaint  Patient presents with  . Lower Back - Pain   HPI: Tamara [name] is a pleasant 27 y.o. adult who presents today for ***  Pertinent ROS were reviewed with the patient and found to be negative unless otherwise specified above in HPI.   Assessment & Plan: Visit Diagnoses: No diagnosis found.  Plan: ***  Follow-up: No follow-ups on file.   Meds & Orders: No orders of the defined types were placed in this encounter.  No orders of the defined types were placed in this encounter.    Procedures: No procedures performed      Clinical History: No specialty comments available.  Tamara [name] reports that [employer]. Tamara [name] has never smoked. Tamara Cruise. Tamara [name] has never been exposed to tobacco smoke. Tamara Cruise. Tamara [name] has never used smokeless tobacco. No results for input(s): "HGBA1C", "LABURIC" in the last 8760 hours.  Objective:   Vital Signs: There were no vitals taken for this visit.  Physical Exam  Gen: Well-appearing, in no acute distress; non-toxic CV: Regular Rate. Well-perfused. Warm.  Resp: Breathing unlabored on room air; no wheezing. Psych: Fluid speech in conversation; appropriate affect; normal thought process Neuro: Sensation intact throughout. No gross coordination deficits.   Ortho Exam - ***  Imaging: No results found.  Past Medical/Family/Surgical/Social History: Medications & Allergies reviewed per EMR, new medications updated. Patient Active Problem List   Diagnosis Date Noted  . 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 Nexplanon in place 05/11/2022  . Chronic insomnia 07/07/2021  . Snoring 07/07/2021  . Super obesity 07/07/2021  . At risk for obstructive sleep apnea 07/07/2021  . 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 Bipolar 1 disorder (HCC) 08/25/2018  . MDD (major depressive disorder), single episode, severe with psychotic features (HCC) 07/27/2018  . Bipolar affective disorder, depressed, moderate (HCC) 07/09/2018  . Borderline personality disorder (HCC) 12/20/2016   Past Medical History:  Diagnosis Date  . ADHD   . Anxiety   . Autism   . Bipolar disorder (HCC)   . Chronic back pain    "mostly lower; sometimes all over" (03/22/2017)  . Chronic stomach ulcer   . Depression   . GERD (gastroesophageal reflux disease)   . Headache    "a few/week" (03/22/2017)  . HLD (hyperlipidemia)   . HTN (hypertension)   . Hypothyroidism   . Migraine    "monthly recently" (03/22/2017)  . Obesity   . Personality disorder (HCC)   . PTSD (post-traumatic stress disorder)   . Seizures (HCC)    "very random; I lose memory of what happens; usually happens when I'm in bed; probably 2-3/year" (03/22/2017)   Family History  Problem Relation Age of Onset  . Diabetes Mother   . Hypertension Maternal Grandmother   . Hyperlipidemia Maternal Grandmother   . Diabetes Maternal Grandmother    Past Surgical History:  Procedure Laterality Date  . wisdom tooth removal     Social History   Occupational History  . Occupation: self employed  Tobacco Use  . Smoking status: Never    Passive exposure: Never  . Smokeless tobacco: Never  Vaping Use  . Vaping Use: Never used  Substance and Sexual Activity  . Alcohol use: No    Alcohol/week: 0.0  standard drinks of alcohol  . Drug use: No  . Sexual activity: Not Currently    Partners: Male, Female    Birth control/protection: Implant, Condom

## 2022-08-18 ENCOUNTER — Encounter: Payer: Self-pay | Admitting: Sports Medicine

## 2023-08-31 ENCOUNTER — Telehealth: Payer: Self-pay | Admitting: *Deleted

## 2023-08-31 NOTE — Telephone Encounter (Signed)
 Received request in [employer] to sign order for cpap supplies. Pt hasn't been seen in nearly 2 years. Needs appt first. Please contact patient and schedule with Harlene or Dr Chalice, next available.

## 2023-09-21 NOTE — Telephone Encounter (Signed)
 Pt returned call and was scheduled.

## 2023-10-06 ENCOUNTER — Ambulatory Visit (INDEPENDENT_AMBULATORY_CARE_PROVIDER_SITE_OTHER): Admitting: Neurology

## 2023-10-06 ENCOUNTER — Encounter: Payer: Self-pay | Admitting: Neurology

## 2023-10-06 VITALS — BP 140/90 | HR 79 | Ht 61.0 in | Wt 363.0 lb

## 2023-10-06 DIAGNOSIS — E669 Obesity, unspecified: Secondary | ICD-10-CM | POA: Diagnosis not present

## 2023-10-06 DIAGNOSIS — Z9189 Other specified personal risk factors, not elsewhere classified: Secondary | ICD-10-CM

## 2023-10-06 DIAGNOSIS — E662 Morbid (severe) obesity with alveolar hypoventilation: Secondary | ICD-10-CM | POA: Diagnosis not present

## 2023-10-06 DIAGNOSIS — F5104 Psychophysiologic insomnia: Secondary | ICD-10-CM

## 2023-10-06 NOTE — Patient Instructions (Signed)
 CPAP and BIPAP Information CPAP and BIPAP use air pressure to keep your airways open and help you breathe well. CPAP and BIPAP use different amounts of pressure. Your health care provider will tell you whether CPAP or BIPAP would be best for you. CPAP stands for continuous positive airway pressure. With CPAP, the amount of pressure stays the same while you breathe in and out. BIPAP stands for bi-level positive airway pressure. With BIPAP, the amount of pressure will be higher when you breathe in and lower when you breathe out. This allows you to take bigger breaths. CPAP or BIPAP may be used in the hospital or at home. You may need to have a sleep study before your provider can order a device for you to use at home. What are the advantages? CPAP and BIPAP are most often used for obstructive sleep apnea to keep the airways from collapsing when the muscles relax during sleep. CPAP or BIPAP can be used if you have: Chronic obstructive pulmonary disease. Heart failure. Medical conditions that cause muscle weakness. Other problems that cause breathing to be shallow, weak, or difficult. What are the risks? Your provider will talk with you about risks. These may include: Sores on your nose or face caused from the mask, prongs, or nasal pillows. Dry or stuffy nose or nosebleeds. Feeling gassy or bloated. Sinus or lung infection if the equipment is not cleaned well. When should CPAP or BIPAP be used? In most cases, CPAP or BIPAP is used during sleep at night or whenever the main sleep time happens. It's also used during naps. People with some medical conditions may need to wear the mask when they're awake. Follow instructions from your provider about when to use your CPAP or BIPAP. What happens during CPAP or BIPAP?  Both CPAP and BIPAP use a small machine that uses electricity to create air pressure. A long tube connects the device to a plastic mask. Air is blown through the mask into your nose or  mouth. The amount of pressure that's used to blow the air can be adjusted. Your provider will set the pressure setting and help you find the best mask for you. Tips for using the mask There are different types and sizes of masks. If your mask does not fit well, talk with your provider about getting a different one. Some common types of masks include: Full face masks, which fit over the mouth and nose. Nasal masks, which fit over the nose. Nasal pillow or prong masks, which fit into the nostrils. The mask needs to be snug to your face, so some people feel trapped or closed in at first. If you feel this way, you may need to get used to the mask. Hold the mask loosely over your nose or mouth and then gradually put the the mask on more snugly. Slowly increase the amount of time you use the mask. If you have trouble with your mask not fitting well or leaking, talk with your provider. Do not stop using the mask. Tips for using the device Follow instructions from your provider about how to and how often to use the device. For home use, CPAP and BIPAP devices come from home health care companies. There are many different brands. Your health insurance company will help to decide which device you get. Keep the CPAP or BIPAP device and attachments clean. Ask your home health care company or check the instruction book for cleaning instructions. Make sure the humidifier is filled with germ-free (sterile)  water and is working correctly. This will help prevent a dry or stuffy nose or nosebleeds. A nasal saline mist or spray may keep your nose from getting dry and sore. Do not eat or drink while the CPAP or BIPAP device is on. Food or drinks could get pushed into your lungs by the pressure of the CPAP or BIPAP. Follow these instructions at home: Take over-the-counter and prescription medicines only as told by your provider. Do not smoke, vape, or use nicotine or tobacco. Contact a health care provider if: You  have redness or pressure sores on your head, face, mouth, or nose from the mask or headgear. You have trouble using the CPAP or BIPAP device. You have trouble going to sleep or staying asleep. Someone tells you that you snore even when wearing your CPAP or BIPAP device. Get help right away if: You have trouble breathing. You feel confused. These symptoms may be an emergency. Get help right away. Call 911. Do not wait to see if the symptoms will go away. Do not drive yourself to the hospital. This information is not intended to replace advice given to you by your health care provider. Make sure you discuss any questions you have with your health care provider. Document Revised: 06/17/2022 Document Reviewed: 06/17/2022 Elsevier Patient Education  2024 Elsevier Inc.    ASSESSMENT AND PLAN :   28 y.o. year old adult  here with:    1) OSA, OHV on CPAP - with good control of apnea on auto set , 85% is 17 cm water.   2) she has had technical difficulties with her CPAP for several months, now has been using it again since June.  I will refill supplies.    3) RV in 12 months with NP. Can be virtual    CC: [facility]   Sleep Clinic Patients are generally offered input on sleep hygiene, life style changes and how to improve compliance with medical treatment where applicable. Review and reiteration of good sleep hygiene measures is offered to any sleep clinic patient, be it in the first consultation or with any follow up visits.  Any CPAP patient should be reminded to be fully compliant with PAP therapy , (defined as using PAP therapy for more than 4 hours each night ) with the goal to improve sleep related symptoms and decrease long term cardiovascular risks. Any PAP therapy patient should be reminded, that it may take up to 3 months to get fully used to using PAP and it may take 1-2 weeks for an established CPAP user to acclimatize to changes in pressure or mask. The  earlier full compliance is achieved, the better long term compliance tends to be.   Please note that untreated obstructive sleep apnea may carry additional perioperative morbidity. Patients with significant obstructive sleep apnea should receive perioperative PAP therapy and the surgical team should be informed of the diagnosis and degree of sleep disordered breathing.  Sleep fragmentation in the presence of normal proportional sleep stages is a nonspecific findings and per se does not signify an intrinsic sleep disorder or a cause for the patient's sleep-related symptoms.  Causes include (but are not limited to) the unfamiliarity of sleeping while recorded by HST device or sleeping in a sleep lab for a full Polysomnography sleep study, but also circadian rhythm disturbances, medication side effects or an underlying mood disorder or medical problem.    Any patient with sleepiness should be cautioned not to drive, work at  heights, or operate dangerous or heavy equipment when feeling tired or sleepy.
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
## 2023-10-06 NOTE — Progress Notes (Signed)
 SABRA

## 2023-10-07 NOTE — Progress Notes (Signed)
 Euphemia Lingerfelt D, CMA  Zott, Gasper Ona, Tammy; Darrel Boyer New orders have been placed for the above pt, DOB: March 31, 1995 Thanks

## 2023-10-21 ENCOUNTER — Telehealth: Payer: Self-pay | Admitting: Neurology

## 2023-10-21 NOTE — Telephone Encounter (Signed)
 Pt called wanting to know why her Cpap Supply Rx has not been signed. Please advise.

## 2023-10-21 NOTE — Telephone Encounter (Signed)
 Zott, Glade Delfino Augustin JAYSON, RN Noted Thank you. and I have pulled the CMN from [facility] and sent it supply so order can be shipped

## 2023-10-21 NOTE — Telephone Encounter (Signed)
 Order was sent to [facility] on 10/06/2023. I will send a message following up with [facility].

## 2024-10-05 ENCOUNTER — Telehealth: Admitting: Adult Health
